# Patient Record
Sex: Female | Born: 1970 | Race: White | Hispanic: No | Smoking: Current every day smoker
Health system: Southern US, Community
[De-identification: ages and names within clinical notes are randomized; demographics above are authoritative.]

## PROBLEM LIST (undated history)

## (undated) DIAGNOSIS — R7303 Prediabetes: Secondary | ICD-10-CM

## (undated) DIAGNOSIS — D509 Iron deficiency anemia, unspecified: Secondary | ICD-10-CM

## (undated) DIAGNOSIS — Z87442 Personal history of urinary calculi: Secondary | ICD-10-CM

## (undated) DIAGNOSIS — T8859XA Other complications of anesthesia, initial encounter: Secondary | ICD-10-CM

## (undated) DIAGNOSIS — Z8719 Personal history of other diseases of the digestive system: Secondary | ICD-10-CM

## (undated) DIAGNOSIS — R350 Frequency of micturition: Secondary | ICD-10-CM

## (undated) DIAGNOSIS — G5790 Unspecified mononeuropathy of unspecified lower limb: Secondary | ICD-10-CM

## (undated) DIAGNOSIS — M1990 Unspecified osteoarthritis, unspecified site: Secondary | ICD-10-CM

## (undated) DIAGNOSIS — K5090 Crohn's disease, unspecified, without complications: Secondary | ICD-10-CM

## (undated) DIAGNOSIS — N390 Urinary tract infection, site not specified: Secondary | ICD-10-CM

## (undated) DIAGNOSIS — N3941 Urge incontinence: Secondary | ICD-10-CM

## (undated) DIAGNOSIS — I10 Essential (primary) hypertension: Secondary | ICD-10-CM

## (undated) DIAGNOSIS — F419 Anxiety disorder, unspecified: Secondary | ICD-10-CM

## (undated) DIAGNOSIS — I82401 Acute embolism and thrombosis of unspecified deep veins of right lower extremity: Secondary | ICD-10-CM

## (undated) DIAGNOSIS — N183 Chronic kidney disease, stage 3 unspecified: Secondary | ICD-10-CM

## (undated) DIAGNOSIS — R0600 Dyspnea, unspecified: Secondary | ICD-10-CM

## (undated) DIAGNOSIS — N939 Abnormal uterine and vaginal bleeding, unspecified: Secondary | ICD-10-CM

## (undated) DIAGNOSIS — R159 Full incontinence of feces: Secondary | ICD-10-CM

## (undated) DIAGNOSIS — Z86718 Personal history of other venous thrombosis and embolism: Secondary | ICD-10-CM

## (undated) DIAGNOSIS — D510 Vitamin B12 deficiency anemia due to intrinsic factor deficiency: Principal | ICD-10-CM

## (undated) DIAGNOSIS — Z7901 Long term (current) use of anticoagulants: Secondary | ICD-10-CM

## (undated) DIAGNOSIS — T4145XA Adverse effect of unspecified anesthetic, initial encounter: Secondary | ICD-10-CM

## (undated) DIAGNOSIS — Z973 Presence of spectacles and contact lenses: Secondary | ICD-10-CM

## (undated) DIAGNOSIS — K219 Gastro-esophageal reflux disease without esophagitis: Secondary | ICD-10-CM

## (undated) DIAGNOSIS — Z789 Other specified health status: Secondary | ICD-10-CM

## (undated) DIAGNOSIS — Z972 Presence of dental prosthetic device (complete) (partial): Secondary | ICD-10-CM

## (undated) DIAGNOSIS — Z8711 Personal history of peptic ulcer disease: Secondary | ICD-10-CM

## (undated) DIAGNOSIS — N3946 Mixed incontinence: Secondary | ICD-10-CM

## (undated) HISTORY — DX: Iron deficiency anemia, unspecified: D50.9

## (undated) HISTORY — PX: EXTRACORPOREAL SHOCK WAVE LITHOTRIPSY: SHX1557

## (undated) HISTORY — DX: Essential (primary) hypertension: I10

## (undated) HISTORY — PX: KNEE SURGERY: SHX244

## (undated) HISTORY — DX: Crohn's disease, unspecified, without complications: K50.90

## (undated) HISTORY — PX: CYSTOSCOPY W/ URETERAL STENT PLACEMENT: SHX1429

## (undated) HISTORY — DX: Anxiety disorder, unspecified: F41.9

## (undated) HISTORY — DX: Vitamin B12 deficiency anemia due to intrinsic factor deficiency: D51.0

## (undated) HISTORY — DX: Gastro-esophageal reflux disease without esophagitis: K21.9

## (undated) HISTORY — PX: LAPAROSCOPIC NISSEN FUNDOPLICATION: SHX1932

---

## 1998-05-11 ENCOUNTER — Other Ambulatory Visit: Admission: RE | Admit: 1998-05-11 | Discharge: 1998-05-11 | Payer: Self-pay | Admitting: Obstetrics & Gynecology

## 1999-01-14 ENCOUNTER — Emergency Department (HOSPITAL_COMMUNITY): Admission: EM | Admit: 1999-01-14 | Discharge: 1999-01-14 | Payer: Self-pay | Admitting: Endocrinology

## 1999-08-23 ENCOUNTER — Other Ambulatory Visit: Admission: RE | Admit: 1999-08-23 | Discharge: 1999-08-23 | Payer: Self-pay | Admitting: Obstetrics & Gynecology

## 2001-10-04 ENCOUNTER — Emergency Department (HOSPITAL_COMMUNITY): Admission: EM | Admit: 2001-10-04 | Discharge: 2001-10-04 | Payer: Self-pay | Admitting: Emergency Medicine

## 2001-10-04 ENCOUNTER — Encounter: Payer: Self-pay | Admitting: Emergency Medicine

## 2007-09-23 ENCOUNTER — Ambulatory Visit (HOSPITAL_COMMUNITY): Admission: RE | Admit: 2007-09-23 | Discharge: 2007-09-23 | Payer: Self-pay | Admitting: Chiropractic Medicine

## 2007-09-25 DIAGNOSIS — K5090 Crohn's disease, unspecified, without complications: Secondary | ICD-10-CM

## 2007-09-25 HISTORY — DX: Crohn's disease, unspecified, without complications: K50.90

## 2007-10-21 ENCOUNTER — Encounter: Admission: RE | Admit: 2007-10-21 | Discharge: 2007-10-21 | Payer: Self-pay | Admitting: Gastroenterology

## 2007-11-25 ENCOUNTER — Encounter (HOSPITAL_COMMUNITY): Admission: RE | Admit: 2007-11-25 | Discharge: 2008-02-07 | Payer: Self-pay | Admitting: Gastroenterology

## 2008-05-09 ENCOUNTER — Emergency Department (HOSPITAL_BASED_OUTPATIENT_CLINIC_OR_DEPARTMENT_OTHER): Admission: EM | Admit: 2008-05-09 | Discharge: 2008-05-09 | Payer: Self-pay | Admitting: Emergency Medicine

## 2008-06-09 ENCOUNTER — Ambulatory Visit: Payer: Self-pay | Admitting: Diagnostic Radiology

## 2008-06-09 ENCOUNTER — Ambulatory Visit (HOSPITAL_BASED_OUTPATIENT_CLINIC_OR_DEPARTMENT_OTHER): Admission: RE | Admit: 2008-06-09 | Discharge: 2008-06-09 | Payer: Self-pay | Admitting: Emergency Medicine

## 2008-06-09 ENCOUNTER — Emergency Department (HOSPITAL_BASED_OUTPATIENT_CLINIC_OR_DEPARTMENT_OTHER): Admission: EM | Admit: 2008-06-09 | Discharge: 2008-06-09 | Payer: Self-pay | Admitting: Emergency Medicine

## 2008-06-11 ENCOUNTER — Inpatient Hospital Stay (HOSPITAL_COMMUNITY): Admission: AD | Admit: 2008-06-11 | Discharge: 2008-06-13 | Payer: Self-pay | Admitting: Gastroenterology

## 2008-06-11 ENCOUNTER — Encounter: Payer: Self-pay | Admitting: Emergency Medicine

## 2008-06-12 ENCOUNTER — Encounter (INDEPENDENT_AMBULATORY_CARE_PROVIDER_SITE_OTHER): Payer: Self-pay | Admitting: Gastroenterology

## 2008-06-30 ENCOUNTER — Emergency Department (HOSPITAL_BASED_OUTPATIENT_CLINIC_OR_DEPARTMENT_OTHER): Admission: EM | Admit: 2008-06-30 | Discharge: 2008-06-30 | Payer: Self-pay | Admitting: Emergency Medicine

## 2008-07-04 ENCOUNTER — Ambulatory Visit: Payer: Self-pay | Admitting: Vascular Surgery

## 2008-07-04 ENCOUNTER — Emergency Department (HOSPITAL_COMMUNITY): Admission: EM | Admit: 2008-07-04 | Discharge: 2008-07-04 | Payer: Self-pay | Admitting: Emergency Medicine

## 2008-07-04 ENCOUNTER — Other Ambulatory Visit: Payer: Self-pay | Admitting: Emergency Medicine

## 2008-07-13 ENCOUNTER — Emergency Department (HOSPITAL_BASED_OUTPATIENT_CLINIC_OR_DEPARTMENT_OTHER): Admission: EM | Admit: 2008-07-13 | Discharge: 2008-07-13 | Payer: Self-pay | Admitting: Emergency Medicine

## 2008-07-17 ENCOUNTER — Emergency Department (HOSPITAL_BASED_OUTPATIENT_CLINIC_OR_DEPARTMENT_OTHER): Admission: EM | Admit: 2008-07-17 | Discharge: 2008-07-17 | Payer: Self-pay | Admitting: Emergency Medicine

## 2008-08-17 ENCOUNTER — Ambulatory Visit: Payer: Self-pay | Admitting: Diagnostic Radiology

## 2008-08-17 ENCOUNTER — Emergency Department (HOSPITAL_BASED_OUTPATIENT_CLINIC_OR_DEPARTMENT_OTHER): Admission: EM | Admit: 2008-08-17 | Discharge: 2008-08-17 | Payer: Self-pay | Admitting: Emergency Medicine

## 2008-09-16 ENCOUNTER — Emergency Department (HOSPITAL_BASED_OUTPATIENT_CLINIC_OR_DEPARTMENT_OTHER): Admission: EM | Admit: 2008-09-16 | Discharge: 2008-09-16 | Payer: Self-pay | Admitting: Emergency Medicine

## 2008-09-23 ENCOUNTER — Encounter (HOSPITAL_COMMUNITY): Admission: RE | Admit: 2008-09-23 | Discharge: 2008-10-29 | Payer: Self-pay | Admitting: Gastroenterology

## 2008-11-08 ENCOUNTER — Ambulatory Visit: Payer: Self-pay | Admitting: Diagnostic Radiology

## 2008-11-08 ENCOUNTER — Emergency Department (HOSPITAL_BASED_OUTPATIENT_CLINIC_OR_DEPARTMENT_OTHER): Admission: EM | Admit: 2008-11-08 | Discharge: 2008-11-08 | Payer: Self-pay | Admitting: Emergency Medicine

## 2009-01-28 ENCOUNTER — Emergency Department (HOSPITAL_BASED_OUTPATIENT_CLINIC_OR_DEPARTMENT_OTHER): Admission: EM | Admit: 2009-01-28 | Discharge: 2009-01-28 | Payer: Self-pay | Admitting: Emergency Medicine

## 2009-01-28 ENCOUNTER — Ambulatory Visit: Payer: Self-pay | Admitting: Diagnostic Radiology

## 2009-02-28 ENCOUNTER — Emergency Department (HOSPITAL_BASED_OUTPATIENT_CLINIC_OR_DEPARTMENT_OTHER): Admission: EM | Admit: 2009-02-28 | Discharge: 2009-02-28 | Payer: Self-pay | Admitting: Emergency Medicine

## 2009-02-28 ENCOUNTER — Ambulatory Visit: Payer: Self-pay | Admitting: Diagnostic Radiology

## 2009-05-01 ENCOUNTER — Emergency Department (HOSPITAL_BASED_OUTPATIENT_CLINIC_OR_DEPARTMENT_OTHER): Admission: EM | Admit: 2009-05-01 | Discharge: 2009-05-01 | Payer: Self-pay | Admitting: Emergency Medicine

## 2009-05-16 ENCOUNTER — Emergency Department (HOSPITAL_BASED_OUTPATIENT_CLINIC_OR_DEPARTMENT_OTHER): Admission: EM | Admit: 2009-05-16 | Discharge: 2009-05-16 | Payer: Self-pay | Admitting: Emergency Medicine

## 2009-08-05 ENCOUNTER — Ambulatory Visit: Payer: Self-pay | Admitting: Surgery

## 2009-08-06 ENCOUNTER — Ambulatory Visit: Payer: Self-pay | Admitting: Hematology & Oncology

## 2009-08-06 ENCOUNTER — Inpatient Hospital Stay (HOSPITAL_COMMUNITY): Admission: EM | Admit: 2009-08-06 | Discharge: 2009-08-11 | Payer: Self-pay | Admitting: Emergency Medicine

## 2009-08-06 ENCOUNTER — Ambulatory Visit: Payer: Self-pay | Admitting: Diagnostic Radiology

## 2009-08-06 ENCOUNTER — Encounter: Payer: Self-pay | Admitting: Emergency Medicine

## 2009-08-06 ENCOUNTER — Encounter: Payer: Self-pay | Admitting: Surgery

## 2009-08-06 ENCOUNTER — Ambulatory Visit: Payer: Self-pay | Admitting: Internal Medicine

## 2009-08-06 HISTORY — PX: TRANSTHORACIC ECHOCARDIOGRAM: SHX275

## 2009-08-12 ENCOUNTER — Ambulatory Visit: Payer: Self-pay | Admitting: Hematology & Oncology

## 2009-08-24 LAB — CBC WITH DIFFERENTIAL (CANCER CENTER ONLY)
BASO%: 1 % (ref 0.0–2.0)
HCT: 38.4 % (ref 34.8–46.6)
LYMPH#: 3 10*3/uL (ref 0.9–3.3)
MONO#: 0.6 10*3/uL (ref 0.1–0.9)
Platelets: 946 10*3/uL — ABNORMAL HIGH (ref 145–400)
RBC: 5.01 10*6/uL (ref 3.70–5.32)
RDW: 21.9 % — ABNORMAL HIGH (ref 10.5–14.6)
WBC: 14.2 10*3/uL — ABNORMAL HIGH (ref 3.9–10.0)

## 2009-08-24 LAB — TECHNOLOGIST REVIEW CHCC SATELLITE

## 2009-08-25 LAB — LUPUS ANTICOAGULANT PANEL

## 2009-08-25 LAB — HEPARIN ANTI-XA: Heparin LMW: 2.01 IU/mL

## 2009-08-30 ENCOUNTER — Ambulatory Visit: Payer: Self-pay | Admitting: Surgery

## 2009-08-30 ENCOUNTER — Encounter: Admission: RE | Admit: 2009-08-30 | Discharge: 2009-08-30 | Payer: Self-pay | Admitting: Surgery

## 2009-08-31 LAB — JAK2 GENOTYPR: JAK2 GenotypR: NOT DETECTED

## 2009-09-09 LAB — CBC WITH DIFFERENTIAL (CANCER CENTER ONLY)
BASO#: 0.2 10*3/uL (ref 0.0–0.2)
BASO%: 1.1 % (ref 0.0–2.0)
EOS%: 4.3 % (ref 0.0–7.0)
HCT: 36.4 % (ref 34.8–46.6)
HGB: 11.7 g/dL (ref 11.6–15.9)
MCH: 25.8 pg — ABNORMAL LOW (ref 26.0–34.0)
MCHC: 32.1 g/dL (ref 32.0–36.0)
MONO%: 6 % (ref 0.0–13.0)
NEUT%: 64.7 % (ref 39.6–80.0)
RDW: 24.4 % — ABNORMAL HIGH (ref 10.5–14.6)

## 2009-09-13 ENCOUNTER — Ambulatory Visit: Payer: Self-pay | Admitting: Hematology & Oncology

## 2009-09-20 LAB — PROTHROMBIN TIME
INR: 3.74 — ABNORMAL HIGH (ref <1.50)
Prothrombin Time: 36.7 seconds — ABNORMAL HIGH (ref 11.6–15.2)

## 2009-09-20 LAB — PROTIME-INR (CHCC SATELLITE)

## 2009-09-21 LAB — PROTIME-INR (CHCC SATELLITE): Protime: 37.2 Seconds — ABNORMAL HIGH (ref 10.6–13.4)

## 2009-09-21 LAB — PROTHROMBIN TIME: Prothrombin Time: 30 seconds — ABNORMAL HIGH (ref 11.6–15.2)

## 2009-09-22 LAB — PROTIME-INR (CHCC SATELLITE): INR: 1.5 — ABNORMAL LOW (ref 2.0–3.5)

## 2009-09-30 LAB — PROTIME-INR (CHCC SATELLITE): INR: 1.8 — ABNORMAL LOW (ref 2.0–3.5)

## 2009-10-07 LAB — PROTIME-INR (CHCC SATELLITE)

## 2009-10-14 ENCOUNTER — Ambulatory Visit: Payer: Self-pay | Admitting: Hematology & Oncology

## 2009-10-18 LAB — PROTIME-INR (CHCC SATELLITE): Protime: 26.4 Seconds — ABNORMAL HIGH (ref 10.6–13.4)

## 2009-11-01 LAB — PROTIME-INR (CHCC SATELLITE): Protime: 52.8 Seconds — ABNORMAL HIGH (ref 10.6–13.4)

## 2009-11-15 ENCOUNTER — Ambulatory Visit: Payer: Self-pay | Admitting: Hematology & Oncology

## 2009-11-15 LAB — CBC WITH DIFFERENTIAL (CANCER CENTER ONLY)
Eosinophils Absolute: 0.5 10*3/uL (ref 0.0–0.5)
HGB: 12.5 g/dL (ref 11.6–15.9)
LYMPH#: 2.5 10*3/uL (ref 0.9–3.3)
MCH: 31.1 pg (ref 26.0–34.0)
MONO%: 5.2 % (ref 0.0–13.0)
NEUT#: 9.8 10*3/uL — ABNORMAL HIGH (ref 1.5–6.5)
Platelets: 628 10*3/uL — ABNORMAL HIGH (ref 145–400)
RBC: 4 10*6/uL (ref 3.70–5.32)
WBC: 13.6 10*3/uL — ABNORMAL HIGH (ref 3.9–10.0)

## 2009-11-15 LAB — PROTIME-INR (CHCC SATELLITE)
INR: 1.3 — ABNORMAL LOW (ref 2.0–3.5)
Protime: 15.6 Seconds — ABNORMAL HIGH (ref 10.6–13.4)

## 2009-11-19 LAB — PROTIME-INR (CHCC SATELLITE)
INR: 2.4 (ref 2.0–3.5)
Protime: 28.8 Seconds — ABNORMAL HIGH (ref 10.6–13.4)

## 2009-11-29 LAB — PROTIME-INR (CHCC SATELLITE): Protime: 36 Seconds — ABNORMAL HIGH (ref 10.6–13.4)

## 2009-12-02 ENCOUNTER — Ambulatory Visit (HOSPITAL_BASED_OUTPATIENT_CLINIC_OR_DEPARTMENT_OTHER): Admission: RE | Admit: 2009-12-02 | Discharge: 2009-12-02 | Payer: Self-pay | Admitting: Hematology & Oncology

## 2009-12-02 ENCOUNTER — Ambulatory Visit: Payer: Self-pay | Admitting: Diagnostic Radiology

## 2009-12-16 ENCOUNTER — Ambulatory Visit: Payer: Self-pay | Admitting: Hematology & Oncology

## 2009-12-17 LAB — CBC WITH DIFFERENTIAL (CANCER CENTER ONLY)
BASO%: 0.8 % (ref 0.0–2.0)
EOS%: 4.1 % (ref 0.0–7.0)
HCT: 33.4 % — ABNORMAL LOW (ref 34.8–46.6)
LYMPH%: 21.4 % (ref 14.0–48.0)
MCHC: 34.1 g/dL (ref 32.0–36.0)
MCV: 92 fL (ref 81–101)
MONO#: 0.9 10*3/uL (ref 0.1–0.9)
MONO%: 6.4 % (ref 0.0–13.0)
NEUT%: 67.3 % (ref 39.6–80.0)
Platelets: 645 10*3/uL — ABNORMAL HIGH (ref 145–400)
RDW: 11.7 % (ref 10.5–14.6)
WBC: 14 10*3/uL — ABNORMAL HIGH (ref 3.9–10.0)

## 2009-12-17 LAB — COMPREHENSIVE METABOLIC PANEL
Alkaline Phosphatase: 112 U/L (ref 39–117)
BUN: 12 mg/dL (ref 6–23)
CO2: 19 mEq/L (ref 19–32)
Creatinine, Ser: 0.97 mg/dL (ref 0.40–1.20)
Glucose, Bld: 86 mg/dL (ref 70–99)
Total Bilirubin: 0.2 mg/dL — ABNORMAL LOW (ref 0.3–1.2)
Total Protein: 7 g/dL (ref 6.0–8.3)

## 2009-12-17 LAB — PROTIME-INR (CHCC SATELLITE): Protime: 54 Seconds — ABNORMAL HIGH (ref 10.6–13.4)

## 2009-12-17 LAB — LACTATE DEHYDROGENASE: LDH: 152 U/L (ref 94–250)

## 2010-01-12 ENCOUNTER — Ambulatory Visit: Payer: Self-pay | Admitting: Diagnostic Radiology

## 2010-01-12 ENCOUNTER — Ambulatory Visit (HOSPITAL_BASED_OUTPATIENT_CLINIC_OR_DEPARTMENT_OTHER): Admission: RE | Admit: 2010-01-12 | Discharge: 2010-01-12 | Payer: Self-pay | Admitting: Family Medicine

## 2010-01-21 ENCOUNTER — Ambulatory Visit: Payer: Self-pay | Admitting: Hematology & Oncology

## 2010-01-21 LAB — PROTIME-INR (CHCC SATELLITE): INR: 4.7 — ABNORMAL HIGH (ref 2.0–3.5)

## 2010-01-28 LAB — PROTIME-INR (CHCC SATELLITE): INR: 2 (ref 2.0–3.5)

## 2010-03-03 ENCOUNTER — Ambulatory Visit: Payer: Self-pay | Admitting: Hematology & Oncology

## 2010-03-17 LAB — CBC WITH DIFFERENTIAL (CANCER CENTER ONLY)
BASO#: 0.1 10*3/uL (ref 0.0–0.2)
BASO%: 0.8 % (ref 0.0–2.0)
EOS%: 3.5 % (ref 0.0–7.0)
Eosinophils Absolute: 0.6 10*3/uL — ABNORMAL HIGH (ref 0.0–0.5)
HCT: 28.3 % — ABNORMAL LOW (ref 34.8–46.6)
HGB: 9 g/dL — ABNORMAL LOW (ref 11.6–15.9)
LYMPH%: 21.2 % (ref 14.0–48.0)
MCV: 77 fL — ABNORMAL LOW (ref 81–101)
MONO#: 1 10*3/uL — ABNORMAL HIGH (ref 0.1–0.9)
NEUT%: 68 % (ref 39.6–80.0)
WBC: 15.7 10*3/uL — ABNORMAL HIGH (ref 3.9–10.0)

## 2010-03-17 LAB — COMPREHENSIVE METABOLIC PANEL
ALT: 14 U/L (ref 0–35)
AST: 18 U/L (ref 0–37)
CO2: 23 mEq/L (ref 19–32)
Calcium: 9 mg/dL (ref 8.4–10.5)
Chloride: 104 mEq/L (ref 96–112)
Sodium: 138 mEq/L (ref 135–145)
Total Protein: 6.7 g/dL (ref 6.0–8.3)

## 2010-03-17 LAB — TECHNOLOGIST REVIEW CHCC SATELLITE

## 2010-03-17 LAB — LACTATE DEHYDROGENASE: LDH: 148 U/L (ref 94–250)

## 2010-03-17 LAB — PROTIME-INR (CHCC SATELLITE)

## 2010-04-05 ENCOUNTER — Ambulatory Visit: Payer: Self-pay | Admitting: Hematology & Oncology

## 2010-05-11 ENCOUNTER — Ambulatory Visit: Payer: Self-pay | Admitting: Hematology & Oncology

## 2010-05-12 LAB — PROTIME-INR (CHCC SATELLITE)
INR: 1.8 — ABNORMAL LOW (ref 2.0–3.5)
Protime: 21.6 Seconds — ABNORMAL HIGH (ref 10.6–13.4)

## 2010-05-12 LAB — CBC WITH DIFFERENTIAL (CANCER CENTER ONLY)
BASO#: 0.2 10*3/uL (ref 0.0–0.2)
BASO%: 1 % (ref 0.0–2.0)
EOS%: 2.7 % (ref 0.0–7.0)
HCT: 41 % (ref 34.8–46.6)
HGB: 13.5 g/dL (ref 11.6–15.9)
LYMPH#: 2.9 10*3/uL (ref 0.9–3.3)
MCHC: 32.9 g/dL (ref 32.0–36.0)
MONO#: 0.8 10*3/uL (ref 0.1–0.9)
NEUT#: 12.8 10*3/uL — ABNORMAL HIGH (ref 1.5–6.5)
NEUT%: 74.7 % (ref 39.6–80.0)
WBC: 17.1 10*3/uL — ABNORMAL HIGH (ref 3.9–10.0)

## 2010-05-13 LAB — LUPUS ANTICOAGULANT PANEL
DRVVT 1:1 Mix: 43.9 secs (ref 36.2–44.3)
DRVVT: 62.7 secs — ABNORMAL HIGH (ref 36.2–44.3)
PTT Lupus Anticoagulant: 51.7 secs — ABNORMAL HIGH (ref 30.0–45.6)
PTTLA 4:1 Mix: 45.6 secs (ref 30.0–45.6)

## 2010-05-13 LAB — RETICULOCYTES (CHCC): Retic Ct Pct: 1.4 % (ref 0.4–3.1)

## 2010-05-27 ENCOUNTER — Ambulatory Visit (HOSPITAL_BASED_OUTPATIENT_CLINIC_OR_DEPARTMENT_OTHER)
Admission: RE | Admit: 2010-05-27 | Discharge: 2010-05-27 | Payer: Self-pay | Source: Home / Self Care | Admitting: Hematology & Oncology

## 2010-07-12 ENCOUNTER — Ambulatory Visit: Payer: Self-pay | Admitting: Hematology & Oncology

## 2010-07-13 LAB — CBC WITH DIFFERENTIAL (CANCER CENTER ONLY)
BASO#: 0.1 10*3/uL (ref 0.0–0.2)
BASO%: 0.8 % (ref 0.0–2.0)
EOS%: 3.5 % (ref 0.0–7.0)
Eosinophils Absolute: 0.4 10*3/uL (ref 0.0–0.5)
HCT: 37.8 % (ref 34.8–46.6)
HGB: 12.8 g/dL (ref 11.6–15.9)
LYMPH#: 2.3 10*3/uL (ref 0.9–3.3)
LYMPH%: 18.7 % (ref 14.0–48.0)
MCH: 32.8 pg (ref 26.0–34.0)
MCHC: 34 g/dL (ref 32.0–36.0)
MCV: 97 fL (ref 81–101)
MONO#: 0.7 10*3/uL (ref 0.1–0.9)
MONO%: 6 % (ref 0.0–13.0)
NEUT#: 8.6 10*3/uL — ABNORMAL HIGH (ref 1.5–6.5)
NEUT%: 71 % (ref 39.6–80.0)
Platelets: 561 10*3/uL — ABNORMAL HIGH (ref 145–400)
RBC: 3.9 10*6/uL (ref 3.70–5.32)
RDW: 13.1 % (ref 10.5–14.6)
WBC: 12.1 10*3/uL — ABNORMAL HIGH (ref 3.9–10.0)

## 2010-07-13 LAB — PROTIME-INR (CHCC SATELLITE)
INR: 4.2 — ABNORMAL HIGH (ref 2.0–3.5)
Protime: 50.4 Seconds — ABNORMAL HIGH (ref 10.6–13.4)

## 2010-07-14 LAB — RETICULOCYTES (CHCC)
ABS Retic: 90.6 10*3/uL (ref 19.0–186.0)
RBC.: 3.94 MIL/uL (ref 3.87–5.11)
Retic Ct Pct: 2.3 % (ref 0.4–3.1)

## 2010-07-14 LAB — ERYTHROPOIETIN: Erythropoietin: 28.2 m[IU]/mL (ref 2.6–34.0)

## 2010-07-17 ENCOUNTER — Encounter: Payer: Self-pay | Admitting: Surgery

## 2010-07-18 ENCOUNTER — Encounter: Payer: Self-pay | Admitting: Gastroenterology

## 2010-07-27 ENCOUNTER — Other Ambulatory Visit: Payer: Self-pay | Admitting: Obstetrics and Gynecology

## 2010-08-03 ENCOUNTER — Other Ambulatory Visit: Payer: Self-pay | Admitting: Hematology & Oncology

## 2010-08-03 ENCOUNTER — Encounter (HOSPITAL_BASED_OUTPATIENT_CLINIC_OR_DEPARTMENT_OTHER): Payer: PRIVATE HEALTH INSURANCE | Admitting: Hematology & Oncology

## 2010-08-03 DIAGNOSIS — D509 Iron deficiency anemia, unspecified: Secondary | ICD-10-CM

## 2010-08-03 DIAGNOSIS — I7411 Embolism and thrombosis of thoracic aorta: Secondary | ICD-10-CM

## 2010-08-03 DIAGNOSIS — D473 Essential (hemorrhagic) thrombocythemia: Secondary | ICD-10-CM

## 2010-08-03 DIAGNOSIS — Z7901 Long term (current) use of anticoagulants: Secondary | ICD-10-CM

## 2010-08-03 LAB — PROTIME-INR (CHCC SATELLITE)
INR: 3.5 (ref 2.0–3.5)
Protime: 42 Seconds — ABNORMAL HIGH (ref 10.6–13.4)

## 2010-08-17 ENCOUNTER — Other Ambulatory Visit: Payer: Self-pay | Admitting: Hematology & Oncology

## 2010-08-17 ENCOUNTER — Encounter (HOSPITAL_BASED_OUTPATIENT_CLINIC_OR_DEPARTMENT_OTHER): Payer: PRIVATE HEALTH INSURANCE | Admitting: Hematology & Oncology

## 2010-08-17 DIAGNOSIS — D509 Iron deficiency anemia, unspecified: Secondary | ICD-10-CM

## 2010-08-17 DIAGNOSIS — I7411 Embolism and thrombosis of thoracic aorta: Secondary | ICD-10-CM

## 2010-08-17 DIAGNOSIS — Z7901 Long term (current) use of anticoagulants: Secondary | ICD-10-CM

## 2010-08-17 DIAGNOSIS — D473 Essential (hemorrhagic) thrombocythemia: Secondary | ICD-10-CM

## 2010-08-17 LAB — CBC WITH DIFFERENTIAL (CANCER CENTER ONLY)
BASO#: 0.1 10*3/uL (ref 0.0–0.2)
Eosinophils Absolute: 0.4 10*3/uL (ref 0.0–0.5)
HGB: 9.8 g/dL — ABNORMAL LOW (ref 11.6–15.9)
LYMPH%: 20.1 % (ref 14.0–48.0)
MCH: 33 pg (ref 26.0–34.0)
MCHC: 34.8 g/dL (ref 32.0–36.0)
MCV: 95 fL (ref 81–101)
MONO%: 6.4 % (ref 0.0–13.0)
NEUT%: 69.8 % (ref 39.6–80.0)
RBC: 2.95 10*6/uL — ABNORMAL LOW (ref 3.70–5.32)

## 2010-08-17 LAB — PROTIME-INR (CHCC SATELLITE)
INR: 2.7 (ref 2.0–3.5)
Protime: 32.4 Seconds — ABNORMAL HIGH (ref 10.6–13.4)

## 2010-08-17 LAB — IRON AND TIBC: %SAT: 7 % — ABNORMAL LOW (ref 20–55)

## 2010-09-09 DIAGNOSIS — N393 Stress incontinence (female) (male): Secondary | ICD-10-CM | POA: Insufficient documentation

## 2010-09-09 DIAGNOSIS — N924 Excessive bleeding in the premenopausal period: Secondary | ICD-10-CM | POA: Insufficient documentation

## 2010-09-14 LAB — CBC
HCT: 24.1 % — ABNORMAL LOW (ref 36.0–46.0)
HCT: 28.7 % — ABNORMAL LOW (ref 36.0–46.0)
HCT: 30.3 % — ABNORMAL LOW (ref 36.0–46.0)
HCT: 30.9 % — ABNORMAL LOW (ref 36.0–46.0)
HCT: 34.4 % — ABNORMAL LOW (ref 36.0–46.0)
Hemoglobin: 11.1 g/dL — ABNORMAL LOW (ref 12.0–15.0)
Hemoglobin: 7.3 g/dL — ABNORMAL LOW (ref 12.0–15.0)
Hemoglobin: 9.5 g/dL — ABNORMAL LOW (ref 12.0–15.0)
Hemoglobin: 9.6 g/dL — ABNORMAL LOW (ref 12.0–15.0)
Hemoglobin: 9.8 g/dL — ABNORMAL LOW (ref 12.0–15.0)
Hemoglobin: 9.9 g/dL — ABNORMAL LOW (ref 12.0–15.0)
MCHC: 30.4 g/dL (ref 30.0–36.0)
MCHC: 31.3 g/dL (ref 30.0–36.0)
MCHC: 31.9 g/dL (ref 30.0–36.0)
MCHC: 32.1 g/dL (ref 30.0–36.0)
MCHC: 32.3 g/dL (ref 30.0–36.0)
MCV: 65.9 fL — ABNORMAL LOW (ref 78.0–100.0)
MCV: 66.2 fL — ABNORMAL LOW (ref 78.0–100.0)
MCV: 71.3 fL — ABNORMAL LOW (ref 78.0–100.0)
MCV: 72 fL — ABNORMAL LOW (ref 78.0–100.0)
MCV: 72.2 fL — ABNORMAL LOW (ref 78.0–100.0)
MCV: 73.4 fL — ABNORMAL LOW (ref 78.0–100.0)
Platelets: 1076 10*3/uL (ref 150–400)
Platelets: 587 K/uL — ABNORMAL HIGH (ref 150–400)
Platelets: 606 K/uL — ABNORMAL HIGH (ref 150–400)
Platelets: 726 K/uL — ABNORMAL HIGH (ref 150–400)
RBC: 3.65 MIL/uL — ABNORMAL LOW (ref 3.87–5.11)
RBC: 4.03 MIL/uL (ref 3.87–5.11)
RBC: 4.2 MIL/uL (ref 3.87–5.11)
RBC: 4.29 MIL/uL (ref 3.87–5.11)
RBC: 4.29 MIL/uL (ref 3.87–5.11)
RBC: 4.36 MIL/uL (ref 3.87–5.11)
RBC: 4.83 MIL/uL (ref 3.87–5.11)
RDW: 27.8 % — ABNORMAL HIGH (ref 11.5–15.5)
RDW: 28.2 % — ABNORMAL HIGH (ref 11.5–15.5)
RDW: 28.5 % — ABNORMAL HIGH (ref 11.5–15.5)
WBC: 17 10*3/uL — ABNORMAL HIGH (ref 4.0–10.5)
WBC: 17 K/uL — ABNORMAL HIGH (ref 4.0–10.5)
WBC: 18.3 10*3/uL — ABNORMAL HIGH (ref 4.0–10.5)
WBC: 24.7 10*3/uL — ABNORMAL HIGH (ref 4.0–10.5)
WBC: 26 K/uL — ABNORMAL HIGH (ref 4.0–10.5)
WBC: 29.6 K/uL — ABNORMAL HIGH (ref 4.0–10.5)

## 2010-09-14 LAB — FOLATE: Folate: 9.2 ng/mL

## 2010-09-14 LAB — COMPREHENSIVE METABOLIC PANEL WITH GFR
ALT: 42 U/L — ABNORMAL HIGH (ref 0–35)
AST: 28 U/L (ref 0–37)
Albumin: 2.5 g/dL — ABNORMAL LOW (ref 3.5–5.2)
Alkaline Phosphatase: 112 U/L (ref 39–117)
BUN: 10 mg/dL (ref 6–23)
CO2: 27 meq/L (ref 19–32)
Calcium: 8.8 mg/dL (ref 8.4–10.5)
Chloride: 102 meq/L (ref 96–112)
Creatinine, Ser: 1.06 mg/dL (ref 0.4–1.2)
GFR calc Af Amer: >60 mL/min (ref >60)
GFR calc non Af Amer: 57 mL/min — ABNORMAL LOW (ref >60)
Glucose, Bld: 88 mg/dL (ref 70–99)
Potassium: 3.1 meq/L — ABNORMAL LOW (ref 3.5–5.1)
Sodium: 133 meq/L — ABNORMAL LOW (ref 135–145)
Total Bilirubin: 0.8 mg/dL (ref 0.3–1.2)
Total Protein: 6.2 g/dL (ref 6.0–8.3)

## 2010-09-14 LAB — BETA-2-GLYCOPROTEIN I ABS, IGG/M/A
Beta-2-Glycoprotein I IgA: 5 U/mL (ref <=15)
Beta-2-Glycoprotein I IgM: 3 U/mL (ref <=15)

## 2010-09-14 LAB — CROSSMATCH: ABO/RH(D): O POS

## 2010-09-14 LAB — BASIC METABOLIC PANEL
CO2: 28 mEq/L (ref 19–32)
Calcium: 9.1 mg/dL (ref 8.4–10.5)
Chloride: 101 mEq/L (ref 96–112)
GFR calc Af Amer: >60 mL/min (ref >60)
GFR calc Af Amer: >60 mL/min (ref >60)
GFR calc non Af Amer: 46 mL/min — ABNORMAL LOW (ref >60)
GFR calc non Af Amer: 51 mL/min — ABNORMAL LOW (ref >60)
GFR calc non Af Amer: 58 mL/min — ABNORMAL LOW (ref >60)
Potassium: 2.9 mEq/L — ABNORMAL LOW (ref 3.5–5.1)
Potassium: 3.7 mEq/L (ref 3.5–5.1)
Sodium: 133 mEq/L — ABNORMAL LOW (ref 135–145)
Sodium: 134 mEq/L — ABNORMAL LOW (ref 135–145)
Sodium: 136 mEq/L (ref 135–145)

## 2010-09-14 LAB — URINE MICROSCOPIC-ADD ON

## 2010-09-14 LAB — DIFFERENTIAL
Band Neutrophils: 0 % (ref 0–10)
Basophils Absolute: 0 K/uL (ref 0.0–0.1)
Basophils Absolute: 0.2 10*3/uL — ABNORMAL HIGH (ref 0.0–0.1)
Basophils Relative: 0 % (ref 0–1)
Basophils Relative: 1 % (ref 0–1)
Blasts: 0 %
Eosinophils Absolute: 0 10*3/uL (ref 0.0–0.7)
Eosinophils Absolute: 0 K/uL (ref 0.0–0.7)
Eosinophils Relative: 0 % (ref 0–5)
Lymphocytes Relative: 6 % — ABNORMAL LOW (ref 12–46)
Lymphocytes Relative: 8 % — ABNORMAL LOW (ref 12–46)
Lymphs Abs: 1.8 K/uL (ref 0.7–4.0)
Metamyelocytes Relative: 0 %
Monocytes Absolute: 1.1 K/uL — ABNORMAL HIGH (ref 0.1–1.0)
Monocytes Relative: 5 % (ref 3–12)
Myelocytes: 0 %
Neutro Abs: 19.3 K/uL — ABNORMAL HIGH (ref 1.7–7.7)
Neutrophils Relative %: 87 % — ABNORMAL HIGH (ref 43–77)
Neutrophils Relative %: 90 % — ABNORMAL HIGH (ref 43–77)
Promyelocytes Absolute: 0 %
Smear Review: INCREASED
nRBC: 0 /100{WBCs}

## 2010-09-14 LAB — PROTIME-INR
INR: 1.19 (ref 0.00–1.49)
Prothrombin Time: 14.1 seconds (ref 11.6–15.2)
Prothrombin Time: 14.2 seconds (ref 11.6–15.2)
Prothrombin Time: 15 s (ref 11.6–15.2)

## 2010-09-14 LAB — IRON AND TIBC
Iron: 13 ug/dL — ABNORMAL LOW (ref 42–135)
Saturation Ratios: 3 % — ABNORMAL LOW (ref 20–55)
TIBC: 431 ug/dL (ref 250–470)
UIBC: 418 ug/dL

## 2010-09-14 LAB — URINALYSIS, ROUTINE W REFLEX MICROSCOPIC
Glucose, UA: NEGATIVE mg/dL
Leukocytes, UA: NEGATIVE
Nitrite: NEGATIVE
Protein, ur: 30 mg/dL — AB
Urobilinogen, UA: 0.2 mg/dL (ref 0.0–1.0)

## 2010-09-14 LAB — RETICULOCYTES
RBC.: 3.72 MIL/uL — ABNORMAL LOW (ref 3.87–5.11)
Retic Count, Absolute: 81.8 K/uL (ref 19.0–186.0)
Retic Ct Pct: 2.2 % (ref 0.4–3.1)

## 2010-09-14 LAB — LUPUS ANTICOAGULANT PANEL
DRVVT: 54.9 s — ABNORMAL HIGH (ref 36.2–46.0)
Drvvt confirmation: 1.07 ratio (ref <1.21)
Lupus Anticoagulant: DETECTED — AB
PTT Lupus Anticoagulant: 75 s — ABNORMAL HIGH (ref 32.0–43.4)
PTTLA 4:1 Mix: 68.6 s — ABNORMAL HIGH (ref 36.3–48.8)
PTTLA Confirmation: 15.2 s — ABNORMAL HIGH (ref <8.0)
dRVVT Incubated 1:1 Mix: 43.8 s (ref 36.1–47.0)

## 2010-09-14 LAB — HOMOCYSTEINE: Homocysteine: 8.3 umol/L (ref 4.0–15.4)

## 2010-09-14 LAB — PROTHROMBIN GENE MUTATION

## 2010-09-14 LAB — HEPARIN LEVEL (UNFRACTIONATED)
Heparin Unfractionated: 0.15 IU/mL — ABNORMAL LOW (ref 0.30–0.70)
Heparin Unfractionated: 0.17 [IU]/mL — ABNORMAL LOW (ref 0.30–0.70)
Heparin Unfractionated: 0.27 [IU]/mL — ABNORMAL LOW (ref 0.30–0.70)
Heparin Unfractionated: 0.32 IU/mL (ref 0.30–0.70)
Heparin Unfractionated: 0.34 IU/mL (ref 0.30–0.70)
Heparin Unfractionated: 0.46 [IU]/mL (ref 0.30–0.70)

## 2010-09-14 LAB — PROTEIN S, TOTAL: Protein S Ag, Total: 136 % (ref 70–140)

## 2010-09-14 LAB — CARDIOLIPIN ANTIBODIES, IGG, IGM, IGA
Anticardiolipin IgA: <3 U/mL — ABNORMAL LOW (ref <10)
Anticardiolipin IgG: 3 GPL U/mL — ABNORMAL LOW (ref <10)
Anticardiolipin IgM: 4 [MPL'U]/mL — ABNORMAL LOW (ref <10)

## 2010-09-14 LAB — COMPREHENSIVE METABOLIC PANEL
ALT: 24 U/L (ref 0–35)
BUN: 13 mg/dL (ref 6–23)
CO2: 27 mEq/L (ref 19–32)
Chloride: 100 mEq/L (ref 96–112)
Creatinine, Ser: 1.1 mg/dL (ref 0.4–1.2)
Potassium: 4.6 mEq/L (ref 3.5–5.1)

## 2010-09-14 LAB — APTT
aPTT: 49 seconds — ABNORMAL HIGH (ref 24–37)
aPTT: 78 s — ABNORMAL HIGH (ref 24–37)

## 2010-09-14 LAB — PROTEIN C ACTIVITY: Protein C Activity: 112 % (ref 75–133)

## 2010-09-14 LAB — PROTEIN S ACTIVITY: Protein S Activity: 88 % (ref 69–129)

## 2010-09-14 LAB — SEDIMENTATION RATE: Sed Rate: 77 mm/h — ABNORMAL HIGH (ref 0–22)

## 2010-09-14 LAB — PATHOLOGIST SMEAR REVIEW

## 2010-09-19 ENCOUNTER — Encounter (HOSPITAL_BASED_OUTPATIENT_CLINIC_OR_DEPARTMENT_OTHER): Payer: PRIVATE HEALTH INSURANCE | Admitting: Hematology & Oncology

## 2010-09-19 ENCOUNTER — Ambulatory Visit (HOSPITAL_BASED_OUTPATIENT_CLINIC_OR_DEPARTMENT_OTHER)
Admission: RE | Admit: 2010-09-19 | Discharge: 2010-09-19 | Disposition: A | Discharge status: Home / Self Care | Payer: PRIVATE HEALTH INSURANCE | Source: Ambulatory Visit | Attending: Hematology & Oncology | Admitting: Hematology & Oncology

## 2010-09-19 ENCOUNTER — Other Ambulatory Visit: Payer: Self-pay | Admitting: Hematology & Oncology

## 2010-09-19 DIAGNOSIS — R109 Unspecified abdominal pain: Secondary | ICD-10-CM | POA: Insufficient documentation

## 2010-09-19 DIAGNOSIS — K5090 Crohn's disease, unspecified, without complications: Secondary | ICD-10-CM | POA: Insufficient documentation

## 2010-09-19 DIAGNOSIS — I7411 Embolism and thrombosis of thoracic aorta: Secondary | ICD-10-CM

## 2010-09-19 DIAGNOSIS — D509 Iron deficiency anemia, unspecified: Secondary | ICD-10-CM

## 2010-09-19 DIAGNOSIS — Z7901 Long term (current) use of anticoagulants: Secondary | ICD-10-CM

## 2010-09-19 DIAGNOSIS — N920 Excessive and frequent menstruation with regular cycle: Secondary | ICD-10-CM

## 2010-09-19 LAB — IRON AND TIBC
%SAT: 4 % — ABNORMAL LOW (ref 20–55)
Iron: 16 ug/dL — ABNORMAL LOW (ref 42–145)
TIBC: 416 ug/dL (ref 250–470)

## 2010-09-19 LAB — PROTIME-INR (CHCC SATELLITE)

## 2010-09-19 LAB — CBC WITH DIFFERENTIAL (CANCER CENTER ONLY)
BASO#: 0.1 10*3/uL (ref 0.0–0.2)
Eosinophils Absolute: 0.4 10*3/uL (ref 0.0–0.5)
HCT: 34.5 % — ABNORMAL LOW (ref 34.8–46.6)
HGB: 11 g/dL — ABNORMAL LOW (ref 11.6–15.9)
LYMPH#: 2.6 10*3/uL (ref 0.9–3.3)
LYMPH%: 14.1 % (ref 14.0–48.0)
MCV: 90 fL (ref 81–101)
MONO#: 0.8 10*3/uL (ref 0.1–0.9)
NEUT%: 79 % (ref 39.6–80.0)
RDW: 15.9 % — ABNORMAL HIGH (ref 11.1–15.7)
WBC: 18.5 10*3/uL — ABNORMAL HIGH (ref 3.9–10.0)

## 2010-09-19 LAB — RETICULOCYTES (CHCC)
ABS Retic: 66.8 10*3/uL (ref 19.0–186.0)
Retic Ct Pct: 1.7 % (ref 0.4–3.1)

## 2010-09-19 LAB — FERRITIN: Ferritin: 12 ng/mL (ref 10–291)

## 2010-09-20 ENCOUNTER — Encounter (HOSPITAL_BASED_OUTPATIENT_CLINIC_OR_DEPARTMENT_OTHER): Payer: PRIVATE HEALTH INSURANCE | Admitting: Hematology & Oncology

## 2010-09-20 DIAGNOSIS — D509 Iron deficiency anemia, unspecified: Secondary | ICD-10-CM

## 2010-10-01 LAB — DIFFERENTIAL
Basophils Relative: 1 % (ref 0–1)
Eosinophils Absolute: 0.4 10*3/uL (ref 0.0–0.7)
Eosinophils Relative: 3 % (ref 0–5)
Lymphs Abs: 2.2 10*3/uL (ref 0.7–4.0)
Monocytes Absolute: 0.6 10*3/uL (ref 0.1–1.0)
Neutro Abs: 8.4 10*3/uL — ABNORMAL HIGH (ref 1.7–7.7)
Neutrophils Relative %: 72 % (ref 43–77)

## 2010-10-01 LAB — CBC
HCT: 27.5 % — ABNORMAL LOW (ref 36.0–46.0)
Hemoglobin: 8.5 g/dL — ABNORMAL LOW (ref 12.0–15.0)
MCHC: 31 g/dL (ref 30.0–36.0)
MCV: 68.9 fL — ABNORMAL LOW (ref 78.0–100.0)
RBC: 3.99 MIL/uL (ref 3.87–5.11)
WBC: 11.7 10*3/uL — ABNORMAL HIGH (ref 4.0–10.5)

## 2010-10-06 LAB — DIFFERENTIAL
Basophils Absolute: 0 10*3/uL (ref 0.0–0.1)
Lymphocytes Relative: 22 % (ref 12–46)
Lymphs Abs: 2.6 10*3/uL (ref 0.7–4.0)
Monocytes Relative: 6 % (ref 3–12)

## 2010-10-06 LAB — LIPASE, BLOOD: Lipase: 44 U/L (ref 23–300)

## 2010-10-06 LAB — URINALYSIS, ROUTINE W REFLEX MICROSCOPIC
Glucose, UA: NEGATIVE mg/dL
Protein, ur: NEGATIVE mg/dL
Specific Gravity, Urine: 1.003 — ABNORMAL LOW (ref 1.005–1.030)
Urobilinogen, UA: 0.2 mg/dL (ref 0.0–1.0)

## 2010-10-06 LAB — CBC
Hemoglobin: 7.3 g/dL — CL (ref 12.0–15.0)
MCHC: 29.5 g/dL — ABNORMAL LOW (ref 30.0–36.0)
MCV: 65.9 fL — ABNORMAL LOW (ref 78.0–100.0)
RBC: 3.76 MIL/uL — ABNORMAL LOW (ref 3.87–5.11)

## 2010-10-06 LAB — COMPREHENSIVE METABOLIC PANEL
CO2: 27 mEq/L (ref 19–32)
Calcium: 8.9 mg/dL (ref 8.4–10.5)
Creatinine, Ser: 0.8 mg/dL (ref 0.4–1.2)
GFR calc non Af Amer: >60 mL/min (ref >60)
Glucose, Bld: 88 mg/dL (ref 70–99)

## 2010-10-06 LAB — CROSSMATCH: ABO/RH(D): O POS

## 2010-10-10 LAB — BASIC METABOLIC PANEL
Calcium: 9.1 mg/dL (ref 8.4–10.5)
Creatinine, Ser: 0.7 mg/dL (ref 0.4–1.2)
GFR calc Af Amer: >60 mL/min (ref >60)
GFR calc non Af Amer: >60 mL/min (ref >60)
Glucose, Bld: 77 mg/dL (ref 70–99)
Sodium: 138 mEq/L (ref 135–145)

## 2010-10-10 LAB — DIFFERENTIAL
Basophils Absolute: 0 10*3/uL (ref 0.0–0.1)
Lymphocytes Relative: 21 % (ref 12–46)
Monocytes Relative: 6 % (ref 3–12)
Neutro Abs: 13.3 10*3/uL — ABNORMAL HIGH (ref 1.7–7.7)
Neutrophils Relative %: 72 % (ref 43–77)

## 2010-10-10 LAB — CBC
Hemoglobin: 8.5 g/dL — ABNORMAL LOW (ref 12.0–15.0)
RDW: 21 % — ABNORMAL HIGH (ref 11.5–15.5)

## 2010-11-08 NOTE — H&P (Signed)
[name], [name]              ACCOUNT NO.:  [id number]   MEDICAL RECORD NO.:  [id number]          PATIENT TYPE:  INP   LOCATION:  4705                         FACILITY:  [facility]   PHYSICIAN:  James L. Oletta Lamas, M.D. DATE OF BIRTH:  1970/10/06   DATE OF ADMISSION:  06/11/2008  DATE OF DISCHARGE:                              HISTORY & PHYSICAL   HISTORY OF PRESENT ILLNESS:  This is a 40 year old female diagnosed with  Crohn's disease in April 2009 by Dr. Wilford Corner.  She reports  having severe epigastric pain that started last Sunday approximately 4  days ago.  She has had such intense burning in her epigastrium that she  has stopped eating.  She has been having dry heaves but no emesis has  been coming up.  She also reports increased number of liquid/semiformed  stool, but cannot give me an exact number.  She tells me that she also  has traces of blood on her toilet tissue.  The patient has required  Dilaudid 1-2 mg q.3 h. for pain in the [facility].  She  has also experienced a decrease in her O2 sats into the 80s.  Her  gastroenterologist is Dr. Wilford Corner.  She has no primary care  physician.   PAST MEDICAL HISTORY:  Significant for Crohn's colitis.  She is status  post Nissen fundoplication procedure and says that she has had no  problems with GERD until last Sunday since her procedure.  She also has  a history of medical noncompliance and not showing up for appointments  in the [place] office.   CURRENT MEDICATIONS:  Include gabapentin, Lexapro, prednisone, Aciphex,  and Asacol.   She has an allergy to IBUPROFEN in that it causes stomach upset.   REVIEW OF SYSTEMS:  Negative for fever and weight loss.  She does report  aches and pains in her knees.   SOCIAL HISTORY:  Positive for 1 packet tobacco a day.  Negative for  alcohol.   FAMILY HISTORY:  Negative for colon cancer.  She is uncertain if there  is any ulcer disease in the family.   PHYSICAL EXAMINATION:  GENERAL:  She is alert and oriented but tired  after being in the emergency room all night.  HEART:  Tachy with no obvious murmurs, gallops, or arrhythmias.  LUNGS:  Clear to auscultation.  ABDOMEN:  Obese, tender in epigastrium, soft, nondistended with good  bowel sounds.   LABORATORY DATA:  Significant for potassium of 2.3, since she has  received two runs of 10 mEq and her potassium is at 2.5.  BUN is 9,  creatinine 0.8.  LFTs are significant only for an alk phos that is  barely elevated at 120.  Her lipase is 38.  White count 15.3, hemoglobin  8.6, hematocrit 27.6, platelet 550,000.  CT of her abdomen done today  shows inflamed distal and terminal ileum, also right colon mucosal  enhancement and submucosal edema.  Of note, she also has a small  calcified gallstone.  She has bilateral kidney stones and right ovarian  cyst.   ASSESSMENT:  Dr. Laurence Spates has seen and examined the patient,  collected history, and reviewed her chart.  His impression is this is a  40 year old female experiencing a Crohn's flare, also with epigastric  burning.  We will  begin our evaluation with an upper endoscopy to evaluate the pain in her  epigastrium and ensure that there is no Crohn's extension into the  esophagus.  We will also check stool for Clostridium difficile, routine  culture, and O&P.  We will start on IV Solu-Medrol, Cipro, Flagyl, and  pain medications as well as Protonix IV b.i.d.      Melton Alar, PA    ______________________________  Joyice Faster. Oletta Lamas, M.D.    MLY/MEDQ  D:  06/11/2008  T:  06/12/2008  Job:  [id number]   cc:   Lear Ng, MD

## 2010-11-08 NOTE — Assessment & Plan Note (Signed)
OFFICE VISIT   Kari Hahn, Kari Hahn  DOB:  03-23-1971                                       08/30/2009  TDDUK#:[id number]   Patient comes back today for follow-up of her recent hospitalization.  She presented to the emergency department with abdominal and back pain.  On CT scan, she was found to have splenic and right renal infarcts.  This was due to aortic thrombus.  The patient also has a history of  Crohn's disease.  She was admitted and monitored very closely, placed on  anticoagulation.  Based on the extensive nature of the thrombus, I  elected to treat her with anticoagulation.  She did improve.  She has  been followed by Dr. Marin Olp of hematology.  She has gone home, and she  is doing much better since she has been at home.  She does have  occasional nausea.  She was also seen by GI in the hospital.  They have  switched her back from Asacol to Pentasa, and her bowel movements have  become more consistent.   PAST MEDICAL HISTORY:  Crohn's disease, hypertension, anemia, anxiety,  aortic thrombus, depression, hyperlipidemia, peripheral neuropathy,  candidal esophagitis.   FAMILY HISTORY:  Negative for cardiovascular at an early age.   SOCIAL HISTORY:  She is married with 2 children.  Smokes 1 pack a day.  Does not drink.   REVIEW OF SYSTEMS:  CARDIAC:  Positive for palpitations, shortness of  breath on exertion.  GENERAL:  Negative.  PULMONARY:  Positive for bronchitis.  GI:  Positive for diarrhea.  GU:  Positive for frequent urination.  VASCULAR:  Positive for pain in legs when walking and when lying flat.  NEURO:  Positive for headaches.  MUSCULOSKELETAL:  Positive for joint pain.  PSYCH:  Positive for depression, anxiety.  EENT:  Negative.  HEMATOLOGY:  Positive for anemia and clotting disorders.  SKIN:  Negative.   PHYSICAL EXAMINATION:  Heart rate 109, blood pressure 103/69,  temperature 98.7.  general:  She is well-appearing in no  distress.  HEENT:  Within normal limits.  Lungs are clear bilaterally.  Cardiovascular:  Regular rate and rhythm.  No murmur.  No carotid  bruits.  Extremities are warm and well-perfused.  Abdomen is soft and  nontender.  Musculoskeletal is without major deformities.  Neuro:  She  has no focal weakness or deficits.  Skin:  She has ecchymosis from her  Arixtra incisions in her lower abdomen.   DIAGNOSTIC STUDIES:  I have independently reviewed her CT scan.  There  has been improvement since her prior study.  The infarcts in the kidney  and spleen are less impressive.  The thrombus burden within the aorta  has decreased.   ASSESSMENT/PLAN:  Aortic thrombus.   PLAN:  I have reiterated to the patient that I think she is  hypercoagulable because of her Crohn's disease and for that reason, I  would recommend lifelong anticoagulation.  Dr. Marin Olp has been  following her, and she has been on Arixtra.  We will consider switching  her over to an oral equivalent in several weeks.   With regards to her Crohn's disease, her medications are being managed  by [facility].  This appears to be stable.   I will plan on seeing the patient back in 3 months with a repeat CT  scan.  Eldridge Abrahams, MD  Electronically Signed   VWB/MEDQ  D:  08/30/2009  T:  08/30/2009  Job:  [id number]   cc:   Rudell Cobb. Marin Olp, M.D.  Dr. Michail Sermon

## 2010-11-08 NOTE — Op Note (Signed)
Kari Hahn, Kari Hahn              ACCOUNT NO.:  [id number]   MEDICAL RECORD NO.:  [id number]          PATIENT TYPE:  INP   LOCATION:  3016                         FACILITY:  [facility]   PHYSICIAN:  James L. Rolla Flatten., M.D.DATE OF BIRTH:  1971/05/17   DATE OF PROCEDURE:  06/12/2008  DATE OF DISCHARGE:                               OPERATIVE REPORT   PROCEDURE:  Esophagogastroduodenoscopy and biopsy.   MEDICATIONS:  Cetacaine spray, Phenergan 25 mg, fentanyl 100 mcg, Versed  10 mg IV.   INDICATION:  A woman with Crohn's with severe epigastric chest pain.   DESCRIPTION OF PROCEDURE:  Procedure had been explained to the patient  and consent obtained.  In left lateral decubitus position, the scope was  inserted blindly.  The patient was very uncooperative and belligerent.  She had ulcerations in her esophagus, could be passed down into the  stomach and the duodenum was entered.  Pylorus identified and passed.  The duodenum including the bulb and second portion were normal.  The  scope was withdrawn back in the stomach.  There were diffuse small  ulcerations in the stomach.  There was shallow really more erosions.  These were biopsied and placed in jar #1.  The patient did have a  Nissen.  This was okay in the retroflex view.  The scope was withdrawn  back in the esophagus.  The patient had multiple shallow linear ulcers.  They were at times confluent extending up from the GE junction up into  the proximal esophagus.  Several biopsies were obtained, they were quite  friable.  Scope was withdrawn.  The patient tolerated the procedure  fairly well and was somewhat uncooperative throughout the procedure.   ASSESSMENT:  1. Diffuse ulcerations in the esophagus could be viral, Crohn's, it is      difficult to tell.  We will have to wait for the biopsies.  2. Diffuse gastric erosions, again of unclear etiology.   PLAN:  We add Carafate Slurry, continue to treat her active Crohn  disease as  manifested on CT scan.           ______________________________  Joyice Faster. Rolla Flatten., M.D.     Kari Hahn  D:  06/12/2008  T:  06/12/2008  Job:  [id number]   cc:   Lear Ng, MD

## 2010-11-08 NOTE — Consult Note (Signed)
NAMEMarland Kitchen  Kari Hahn, Kari Hahn NO.:  [id number]   MEDICAL RECORD NO.:  [id number]          PATIENT TYPE:  EMS   LOCATION:  MAJO                         FACILITY:  [facility]   PHYSICIAN:  Judeth Cornfield. Scot Dock, M.D.DATE OF BIRTH:  15-Apr-1971   DATE OF CONSULTATION:  DATE OF DISCHARGE:  07/04/2008                                 CONSULTATION   REASON FOR CONSULTATION:  Pain in both feet.   HISTORY:  This is a pleasant 40 year old woman who 2 weeks ago noted the  gradual onset of pain in both feet.  She noted that the pain was more  significant on the left side.  Subsequently, she noticed some  discoloration in her left third, fourth, and fifth toes.  She ultimately  presented to the emergency department in [facility], and the emergency  physician there evaluated the patient today and past asked that Vascular  Surgery be consulted and she was sent, therefore, to the [facility] Emergency  Department.  Prior to developing this pain in her feet 2 weeks ago, she  does admit to bilateral lower extremity calf claudication, which occurs  at a fairly short distance.  I do not get any history of rest pain  except for the pain she is having in her toes since her most recent  symptoms began 2 weeks ago.  She has had no history of nonhealing  wounds.  She states the claudication symptoms in her calves has been  going on for months.  She cannot be more specific than that though  symptoms have been stable.   Her past medical history is significant for:  1. Crohn disease.  2. She recently was diagnosed with esophageal ulcers.  3. She also had mildly elevated blood pressure.  4. She denies any history of diabetes, hypercholesterolemia, history      of previous myocardial infarction, history of congestive heart      failure, history of COPD.   Past surgical history is significant for:  1. Two previous C-sections.  2. Lap Nissen.  3. Surgery on her left knee.   MEDICATIONS:  1. Asacol 4  tablets 400 mg t.i.d.  2. Prednisone 40 mg p.o. daily.  3. Aciphex 20 mg p.o. b.i.d.  4. K-Dur 20 mEq p.o. q.a.m.  5. Lexapro 20 mg p.o. daily.  6. Gabapentin 600 mg p.o. daily.   ALLERGIES:  No known drug allergies.   SOCIAL HISTORY:  She is married.  She has 2 children.  She smokes a pack  per day of cigarettes and has been smoking since she was 12.   FAMILY HISTORY:  She is unaware of any history of premature  cardiovascular disease.   REVIEW OF SYSTEMS:  GENERAL:  She has had no recent weight loss, weight  gain, problems with her appetite.  CARDIAC:  She has had no chest pain  except for some mild chest pressure at times.  She also admits to  palpitations at times.  She denies orthopnea.  She does admit to dyspnea  on exertion.  PULMONARY:  She has had no recent productive cough,  bronchitis, asthma, or  wheezing.  GI:  She has a history of Crohn's and  has had diarrhea in the past, although recently this has been better.  She was recently diagnosed with some esophageal ulcers; however, these  recently have not been causing symptoms.  GU:  She has had a yeast  infection, recently there has been no dysuria or frequency.  VASCULAR:  She has had claudication of both calves.  No rest pain, no nonhealing  ulcers.  She denies any history of stroke, TIAs, or amaurosis fugax.  She has had no history of DVT or phlebitis.  NEURO:  She has had no  dizziness, blackouts, or seizures.  She does have occasional headaches.  HEMATOLOGIC:  She has had no bleeding problems or clotting disorders  that she is aware of.  ENT:  She has had no recent change in her  eyesight or vision.   PHYSICAL EXAMINATION:  This is a pleasant 40 year old woman who appears  her stated age.  Her blood pressure is 112/68.  Her heart rate is 78.  Neck is supple.  There is no cervical lymphadenopathy.  She does have a  left carotid bruit.  HEENT is unremarkable.  Lungs are clear bilaterally  to auscultation.  On cardiac  exam, she has a regular rate and rhythm  without murmur appreciated.  The abdomen is soft and nontender.  She has  normal pitched bowel sounds.  I cannot palpate an aneurysm.  She has  normal femoral popliteal, dorsalis pedis, and posterior tibial pulses  bilaterally.  I do not appreciate any femoral bruits.  She has no  significant lower extremity swelling.  Neurologic exam is nonfocal with  good strength in her upper extremities and lower extremities  bilaterally.  She has some bluish discoloration of the left third,  fourth, and fifth toes consistent with atheroembolic disease.  She has  minimal discoloration of the plantar aspect of her right fifth toe.  She  had biphasic Doppler signals in both feet with warm, well-perfused feet.   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  She does have palpable pedal pulses with biphasic Doppler signals in  both feet and adequate perfuse of the feet.  I had a long discussion  with her today about the importance of tobacco cessation and  relationship of nicotine to vasospasm.  I have explained that there is  really not much to do for the pain.  I am sure of control it with  narcotics for now and that this should gradually improve with time given  that she has normal circulation.  I have recommend we proceed with an  arteriogram to evaluate for potential source of atheroembolic disease to  prevent further episodes in the future.  I will start her on Plavix as  we do ultimately find a stenosis amenable to angioplasty and stenting.  This will help the patency of the stent.  She has previous appointments  and did not want to schedule her arteriogram until July 20, 2008.  If  symptoms progress before that, she will call the office and I will  evaluate her sooner.  She will also need a carotid duplex scan and she  see me in the office, as she does have a  left carotid bruit.      Judeth Cornfield. Scot Dock, M.D.  Electronically Signed     CSD/MEDQ  D:  07/04/2008  T:  07/04/2008  Job:  [id number]   cc:   Ashby Dawes. Polite, M.D.

## 2010-11-08 NOTE — Discharge Summary (Signed)
NAMESAMAMTHA, TIEGS              ACCOUNT NO.:  [id number]   MEDICAL RECORD NO.:  [id number]          PATIENT TYPE:  INP   LOCATION:  3016                         FACILITY:  [facility]   PHYSICIAN:  John C. Amedeo Plenty, M.D.    DATE OF BIRTH:  Oct 07, 1970   DATE OF ADMISSION:  06/11/2008  DATE OF DISCHARGE:  06/13/2008                               DISCHARGE SUMMARY   ADMIT DIAGNOSIS:  Epigastric pain x4 days.   DISCHARGE DIAGNOSES:  1. Ulcerations of the esophagus and gastrium, pathology consistent      with Candida.  2. Crohn colitis flare.  3. History of Nissen fundoplication procedure.  4. History of noncompliance with medical office appointments.   CONSULTS:  None.   PROCEDURES:  Upper endoscopy done on December 18 by Dr. Laurence Spates.  Impression is as follows; diffuse ulcerations in the esophagus as well  as the gastrium, could be viral.   PATHOLOGY RESULTS:  1. Moderate chronic active gastritis.  2. Esophageal ulcerations, fungal organisms consistent with Candida      species.   RADIOLOGICAL EXAM:  CT of her abdomen and pelvis done on December 17  showed enhancement of the right colon along with submucosal edema,  scattered borderline mesenteric and retroperitoneal lymph nodes with a  prominent cluster near the cecum, findings consistent with Crohn  colitis, also small calcified gallstones, small bilateral renal calculi,  stable borderline enlarged mesenteric lymph nodes, right ovarian cyst.   BRIEF HISTORY AND HOSPITAL COURSE:  Ms. Fasnacht is a 40 year old female  diagnosed with Crohn disease in April 2009 by Dr. Michail Sermon.  On  admission, she reported a 4-day history of intense burning in her  epigastrium to the point where it stopped her from eating.  She had also  been having traces of red blood on her toilet tissue and had required  Dilaudid q.3 h. for pain overnight at the [facility].  She had a decrease in her oxygen saturations into the 80s which was  quickly corrected.  She was admitted into [facility], placed on  clear liquid diet and given pain medications.  She was scheduled for an  upper endoscopy the following day.  CT scan showed active Crohn's in the  area of her right colon and cecum.  Upper endoscopy the following day  was as described above.  Following her endoscopy, she was able to  tolerate a full liquid diet.  Her diet was advanced as tolerated.  On  June 13, 2008, the patient was requesting discharge.  It was felt  that she was in stable condition and able to be discharged safely to  home.   Pertinent labs on June 13, 2008, date of discharge, hemoglobin was  8.1, hematocrit 26.2, white count 14.7, platelets 455,000.  She had a C.  diff toxin that was negative during her stay.  BMET on June 13, 2008, was completely within normal limits other than her glucose, which  was 162.  Amylase and lipase were normal during her stay.  She was  scheduled up for a followup office appointment with Dr. Michail Sermon  on  June 16, 2008.   DISCHARGE MEDICATIONS:  1. Asacol 4 tablets of 400 mg each t.i.d.  2. Prednisone 40 mg daily.  3. Aciphex 20 mg twice a day.  4. K-Dur 20 mg each morning.  5. Lexapro 20 mg daily.  6. Gabapentin 600 mg daily.      Melton Alar, PA    ______________________________  Elyse Jarvis Amedeo Plenty, M.D.    MLY/MEDQ  D:  06/22/2008  T:  06/22/2008  Job:  [id number]   cc:   Lear Ng, MD  Ashby Dawes. Polite, M.D.  Jeneen Rinks Little

## 2010-11-14 ENCOUNTER — Other Ambulatory Visit: Payer: Self-pay | Admitting: Hematology & Oncology

## 2010-11-14 ENCOUNTER — Encounter (HOSPITAL_BASED_OUTPATIENT_CLINIC_OR_DEPARTMENT_OTHER): Payer: PRIVATE HEALTH INSURANCE | Admitting: Hematology & Oncology

## 2010-11-14 DIAGNOSIS — N920 Excessive and frequent menstruation with regular cycle: Secondary | ICD-10-CM

## 2010-11-14 DIAGNOSIS — Z7901 Long term (current) use of anticoagulants: Secondary | ICD-10-CM

## 2010-11-14 DIAGNOSIS — I7411 Embolism and thrombosis of thoracic aorta: Secondary | ICD-10-CM

## 2010-11-14 DIAGNOSIS — D473 Essential (hemorrhagic) thrombocythemia: Secondary | ICD-10-CM

## 2010-11-14 DIAGNOSIS — D509 Iron deficiency anemia, unspecified: Secondary | ICD-10-CM

## 2010-11-14 LAB — CBC WITH DIFFERENTIAL (CANCER CENTER ONLY)
BASO#: 0.1 10*3/uL (ref 0.0–0.2)
Eosinophils Absolute: 0.4 10*3/uL (ref 0.0–0.5)
HGB: 12.6 g/dL (ref 11.6–15.9)
LYMPH#: 2.8 10*3/uL (ref 0.9–3.3)
MCH: 30.1 pg (ref 26.0–34.0)
MONO#: 0.9 10*3/uL (ref 0.1–0.9)
NEUT#: 9.2 10*3/uL — ABNORMAL HIGH (ref 1.5–6.5)
RBC: 4.18 10*6/uL (ref 3.70–5.32)
WBC: 13.3 10*3/uL — ABNORMAL HIGH (ref 3.9–10.0)

## 2010-11-14 LAB — PROTIME-INR (CHCC SATELLITE)
INR: 1.2 — ABNORMAL LOW (ref 2.0–3.5)
Protime: 14.4 Seconds — ABNORMAL HIGH (ref 10.6–13.4)

## 2010-11-22 ENCOUNTER — Encounter (HOSPITAL_BASED_OUTPATIENT_CLINIC_OR_DEPARTMENT_OTHER): Payer: PRIVATE HEALTH INSURANCE | Admitting: Hematology & Oncology

## 2010-11-22 ENCOUNTER — Other Ambulatory Visit: Payer: Self-pay | Admitting: Hematology & Oncology

## 2010-11-22 DIAGNOSIS — I7411 Embolism and thrombosis of thoracic aorta: Secondary | ICD-10-CM

## 2010-11-22 DIAGNOSIS — N920 Excessive and frequent menstruation with regular cycle: Secondary | ICD-10-CM

## 2010-11-22 DIAGNOSIS — Z7901 Long term (current) use of anticoagulants: Secondary | ICD-10-CM

## 2010-11-22 DIAGNOSIS — D509 Iron deficiency anemia, unspecified: Secondary | ICD-10-CM

## 2010-11-22 LAB — PROTIME-INR (CHCC SATELLITE)
INR: 1.3 — ABNORMAL LOW (ref 2.0–3.5)
Protime: 15.6 Seconds — ABNORMAL HIGH (ref 10.6–13.4)

## 2010-12-06 ENCOUNTER — Encounter (HOSPITAL_BASED_OUTPATIENT_CLINIC_OR_DEPARTMENT_OTHER): Payer: PRIVATE HEALTH INSURANCE | Admitting: Hematology & Oncology

## 2010-12-06 ENCOUNTER — Other Ambulatory Visit: Payer: Self-pay | Admitting: Hematology & Oncology

## 2011-02-13 ENCOUNTER — Encounter (HOSPITAL_BASED_OUTPATIENT_CLINIC_OR_DEPARTMENT_OTHER): Payer: PRIVATE HEALTH INSURANCE | Admitting: Hematology & Oncology

## 2011-02-13 ENCOUNTER — Other Ambulatory Visit: Payer: Self-pay | Admitting: Hematology & Oncology

## 2011-02-13 DIAGNOSIS — D509 Iron deficiency anemia, unspecified: Secondary | ICD-10-CM

## 2011-02-13 DIAGNOSIS — Z7901 Long term (current) use of anticoagulants: Secondary | ICD-10-CM

## 2011-02-13 DIAGNOSIS — I7411 Embolism and thrombosis of thoracic aorta: Secondary | ICD-10-CM

## 2011-02-13 DIAGNOSIS — N920 Excessive and frequent menstruation with regular cycle: Secondary | ICD-10-CM

## 2011-02-13 LAB — IRON AND TIBC
Iron: 14 ug/dL — ABNORMAL LOW (ref 42–145)
TIBC: 401 ug/dL (ref 250–470)
UIBC: 387 ug/dL

## 2011-02-13 LAB — PROTIME-INR (CHCC SATELLITE): INR: 3.5 (ref 2.0–3.5)

## 2011-02-13 LAB — CBC WITH DIFFERENTIAL (CANCER CENTER ONLY)
BASO#: 0.1 10*3/uL (ref 0.0–0.2)
BASO%: 0.6 % (ref 0.0–2.0)
EOS%: 1.9 % (ref 0.0–7.0)
HCT: 32.9 % — ABNORMAL LOW (ref 34.8–46.6)
HGB: 11.1 g/dL — ABNORMAL LOW (ref 11.6–15.9)
LYMPH#: 1.9 10*3/uL (ref 0.9–3.3)
MONO#: 0.6 10*3/uL (ref 0.1–0.9)
NEUT#: 11.2 10*3/uL — ABNORMAL HIGH (ref 1.5–6.5)
NEUT%: 79.5 % (ref 39.6–80.0)
RDW: 14.9 % (ref 11.1–15.7)
WBC: 14 10*3/uL — ABNORMAL HIGH (ref 3.9–10.0)

## 2011-02-22 ENCOUNTER — Encounter (HOSPITAL_BASED_OUTPATIENT_CLINIC_OR_DEPARTMENT_OTHER): Payer: PRIVATE HEALTH INSURANCE | Admitting: Hematology & Oncology

## 2011-02-22 DIAGNOSIS — D509 Iron deficiency anemia, unspecified: Secondary | ICD-10-CM

## 2011-03-23 LAB — CROSSMATCH: Antibody Screen: NEGATIVE

## 2011-03-29 LAB — COMPREHENSIVE METABOLIC PANEL
ALT: 8
BUN: 9
CO2: 27
Calcium: 9.5
Creatinine, Ser: 0.8
GFR calc non Af Amer: >60
Glucose, Bld: 99
Total Protein: 7.1

## 2011-03-29 LAB — DIFFERENTIAL
Basophils Relative: 1
Eosinophils Relative: 2
Lymphocytes Relative: 20
Monocytes Absolute: 1
Monocytes Relative: 7
Neutrophils Relative %: 70

## 2011-03-29 LAB — CBC
HCT: 28.4 — ABNORMAL LOW
Hemoglobin: 9 — ABNORMAL LOW
MCHC: 31.6
MCV: 68.1 — ABNORMAL LOW
RBC: 4.17
RDW: 17.6 — ABNORMAL HIGH

## 2011-03-29 LAB — LIPASE, BLOOD: Lipase: 44

## 2011-03-31 LAB — COMPREHENSIVE METABOLIC PANEL
ALT: 18 U/L (ref 0–35)
AST: 75 U/L — ABNORMAL HIGH (ref 0–37)
Albumin: 3.8 g/dL (ref 3.5–5.2)
Alkaline Phosphatase: 120 U/L — ABNORMAL HIGH (ref 39–117)
BUN: 9 mg/dL (ref 6–23)
BUN: 5 mg/dL — ABNORMAL LOW (ref 6–23)
CO2: 30 mEq/L (ref 19–32)
CO2: 31 mEq/L (ref 19–32)
CO2: 29 mEq/L (ref 19–32)
Calcium: 8.8 mg/dL (ref 8.4–10.5)
Calcium: 8.5 mg/dL (ref 8.4–10.5)
Chloride: 99 mEq/L (ref 96–112)
Chloride: 99 mEq/L (ref 96–112)
Chloride: 102 mEq/L (ref 96–112)
Creatinine, Ser: 0.8 mg/dL (ref 0.4–1.2)
Creatinine, Ser: 0.75 mg/dL (ref 0.4–1.2)
GFR calc Af Amer: >60 mL/min (ref >60)
GFR calc Af Amer: >60 mL/min (ref >60)
GFR calc non Af Amer: >60 mL/min (ref >60)
GFR calc non Af Amer: >60 mL/min (ref >60)
GFR calc non Af Amer: >60 mL/min (ref >60)
Glucose, Bld: 88 mg/dL (ref 70–99)
Potassium: 2.5 mEq/L — CL (ref 3.5–5.1)
Sodium: 138 mEq/L (ref 135–145)
Total Bilirubin: 0.3 mg/dL (ref 0.3–1.2)
Total Bilirubin: 0.2 mg/dL — ABNORMAL LOW (ref 0.3–1.2)

## 2011-03-31 LAB — BASIC METABOLIC PANEL
BUN: 12 mg/dL (ref 6–23)
BUN: 4 mg/dL — ABNORMAL LOW (ref 6–23)
CO2: 26 mEq/L (ref 19–32)
Calcium: 7.9 mg/dL — ABNORMAL LOW (ref 8.4–10.5)
Calcium: 8.7 mg/dL (ref 8.4–10.5)
Creatinine, Ser: 0.83 mg/dL (ref 0.4–1.2)
GFR calc non Af Amer: >60 mL/min (ref >60)
GFR calc non Af Amer: >60 mL/min (ref >60)
Glucose, Bld: 162 mg/dL — ABNORMAL HIGH (ref 70–99)
Glucose, Bld: 229 mg/dL — ABNORMAL HIGH (ref 70–99)
Potassium: 2.7 mEq/L — CL (ref 3.5–5.1)
Sodium: 128 mEq/L — ABNORMAL LOW (ref 135–145)

## 2011-03-31 LAB — CBC
HCT: 27.6 % — ABNORMAL LOW (ref 36.0–46.0)
HCT: 24.2 % — ABNORMAL LOW (ref 36.0–46.0)
HCT: 26.2 % — ABNORMAL LOW (ref 36.0–46.0)
Hemoglobin: 8.1 g/dL — ABNORMAL LOW (ref 12.0–15.0)
Hemoglobin: 8.6 g/dL — ABNORMAL LOW (ref 12.0–15.0)
MCHC: 30.4 g/dL (ref 30.0–36.0)
MCV: 66.3 fL — ABNORMAL LOW (ref 78.0–100.0)
MCV: 66.9 fL — ABNORMAL LOW (ref 78.0–100.0)
Platelets: 532 10*3/uL — ABNORMAL HIGH (ref 150–400)
Platelets: 550 10*3/uL — ABNORMAL HIGH (ref 150–400)
Platelets: 455 10*3/uL — ABNORMAL HIGH (ref 150–400)
RBC: 4.16 MIL/uL (ref 3.87–5.11)
RBC: 3.62 MIL/uL — ABNORMAL LOW (ref 3.87–5.11)
RDW: 19.2 % — ABNORMAL HIGH (ref 11.5–15.5)
RDW: 20.7 % — ABNORMAL HIGH (ref 11.5–15.5)
WBC: 15.3 10*3/uL — ABNORMAL HIGH (ref 4.0–10.5)
WBC: 16.3 10*3/uL — ABNORMAL HIGH (ref 4.0–10.5)
WBC: 13.5 10*3/uL — ABNORMAL HIGH (ref 4.0–10.5)
WBC: 14.7 10*3/uL — ABNORMAL HIGH (ref 4.0–10.5)

## 2011-03-31 LAB — DIFFERENTIAL
Basophils Absolute: 0 10*3/uL (ref 0.0–0.1)
Basophils Absolute: 0 10*3/uL (ref 0.0–0.1)
Basophils Relative: 0 % (ref 0–1)
Eosinophils Absolute: 0.2 10*3/uL (ref 0.0–0.7)
Lymphocytes Relative: 17 % (ref 12–46)
Lymphs Abs: 1.6 10*3/uL (ref 0.7–4.0)
Monocytes Absolute: 0.9 10*3/uL (ref 0.1–1.0)
Monocytes Relative: 4 % (ref 3–12)
Neutro Abs: 13.8 10*3/uL — ABNORMAL HIGH (ref 1.7–7.7)
Neutrophils Relative %: 76 % (ref 43–77)
Smear Review: INCREASED

## 2011-03-31 LAB — CLOSTRIDIUM DIFFICILE EIA: C difficile Toxins A+B, EIA: NEGATIVE

## 2011-03-31 LAB — CROSSMATCH
ABO/RH(D): O POS
Antibody Screen: NEGATIVE

## 2011-03-31 LAB — LIPASE, BLOOD: Lipase: 38 U/L (ref 23–300)

## 2011-04-18 ENCOUNTER — Encounter: Payer: Self-pay | Admitting: *Deleted

## 2011-04-26 ENCOUNTER — Telehealth: Payer: Self-pay | Admitting: Hematology & Oncology

## 2011-05-01 NOTE — Telephone Encounter (Signed)
Pt aware.

## 2011-05-10 ENCOUNTER — Other Ambulatory Visit: Payer: PRIVATE HEALTH INSURANCE | Admitting: Lab

## 2011-05-10 ENCOUNTER — Ambulatory Visit: Payer: PRIVATE HEALTH INSURANCE | Admitting: Hematology & Oncology

## 2011-05-16 ENCOUNTER — Other Ambulatory Visit: Payer: Self-pay | Admitting: Family

## 2011-05-16 ENCOUNTER — Ambulatory Visit (HOSPITAL_BASED_OUTPATIENT_CLINIC_OR_DEPARTMENT_OTHER): Payer: PRIVATE HEALTH INSURANCE | Admitting: Hematology & Oncology

## 2011-05-16 ENCOUNTER — Other Ambulatory Visit (HOSPITAL_BASED_OUTPATIENT_CLINIC_OR_DEPARTMENT_OTHER): Payer: PRIVATE HEALTH INSURANCE | Admitting: Lab

## 2011-05-16 VITALS — BP 104/70 | HR 86 | Temp 98.0°F | Ht 62.0 in | Wt 219.0 lb

## 2011-05-16 DIAGNOSIS — D509 Iron deficiency anemia, unspecified: Secondary | ICD-10-CM

## 2011-05-16 DIAGNOSIS — Z7901 Long term (current) use of anticoagulants: Secondary | ICD-10-CM

## 2011-05-16 DIAGNOSIS — I7410 Embolism and thrombosis of unspecified parts of aorta: Secondary | ICD-10-CM

## 2011-05-16 DIAGNOSIS — I7411 Embolism and thrombosis of thoracic aorta: Secondary | ICD-10-CM

## 2011-05-16 DIAGNOSIS — D473 Essential (hemorrhagic) thrombocythemia: Secondary | ICD-10-CM

## 2011-05-16 LAB — CBC WITH DIFFERENTIAL (CANCER CENTER ONLY)
BASO#: 0.1 10*3/uL (ref 0.0–0.2)
BASO%: 0.4 % (ref 0.0–2.0)
EOS%: 1.9 % (ref 0.0–7.0)
HGB: 13.5 g/dL (ref 11.6–15.9)
MCH: 32.5 pg (ref 26.0–34.0)
MCHC: 34.5 g/dL (ref 32.0–36.0)
MONO%: 5.5 % (ref 0.0–13.0)
NEUT#: 10.4 10*3/uL — ABNORMAL HIGH (ref 1.5–6.5)
RDW: 17.6 % — ABNORMAL HIGH (ref 11.1–15.7)

## 2011-05-16 LAB — PROTIME-INR (CHCC SATELLITE): INR: 1.9 — ABNORMAL LOW (ref 2.0–3.5)

## 2011-05-16 LAB — IRON AND TIBC
%SAT: 8 % — ABNORMAL LOW (ref 20–55)
Iron: 31 ug/dL — ABNORMAL LOW (ref 42–145)
TIBC: 398 ug/dL (ref 250–470)
TIBC: 398 ug/dL (ref 250–470)

## 2011-05-16 NOTE — Patient Instructions (Signed)
Current Outpatient Prescriptions  Medication Sig Dispense Refill  . ALPRAZolam (XANAX) 0.5 MG tablet Take 0.5 mg by mouth at bedtime as needed.        Marland Kitchen amLODipine-valsartan (EXFORGE) 5-160 MG per tablet Take 1 tablet by mouth daily.        . B Complex-C (B-COMPLEX WITH VITAMIN C) tablet Take 1 tablet by mouth daily.        . cetirizine (ZYRTEC) 10 MG tablet Take 10 mg by mouth daily.        . cholecalciferol (VITAMIN D) 1000 UNITS tablet Take 1,000 Units by mouth daily.        Marland Kitchen escitalopram (LEXAPRO) 20 MG tablet Take 20 mg by mouth daily.        . folic acid (FOLVITE) 1 MG tablet Take 1 mg by mouth daily.        Marland Kitchen gabapentin (NEURONTIN) 600 MG tablet Take 600 mg by mouth 3 (three) times daily.        Marland Kitchen HYDROcodone-acetaminophen (MAXIDONE) 10-750 MG per tablet Take 1 tablet by mouth every 8 (eight) hours as needed.        . mesalamine (PENTASA) 500 MG CR capsule Take 1,000 mg by mouth 4 (four) times daily.       . potassium chloride (KLOR-CON) 10 MEQ CR tablet Take 10 mEq by mouth daily.        . simvastatin (ZOCOR) 20 MG tablet Take 20 mg by mouth at bedtime.        Marland Kitchen warfarin (COUMADIN) 5 MG tablet Take 7.5 mg by mouth daily. Takes 7.5 mg every day except Tues and Thurs, on these days, takes 10 mg. Teola Bradley, Floy Angert La[place]

## 2011-05-16 NOTE — Progress Notes (Signed)
CC:   Kari Ng, MD V. Kari Hahn IV, MD  DIAGNOSIS: 1. Idiopathic aortic thrombosis. 2. Iron-deficiency anemia.  CURRENT THERAPY: 1. IV iron as indicated. 2. Coumadin 7.5 mg alternating with 10 mg p.o. daily.  INTERIM HISTORY:  Kari Hahn comes in for followup.  She is doing okay. She is having a lot of joint issues.  She says she gets some joint flare- ups.  I told her to try some over-the-counter Zostrix cream on the joints when needed.  She is having no bleeding.  She is taking Coumadin regularly.  She is very diligent with this.  We last gave her iron back in, I think, August.  At that point in time, her iron saturation was only 3%.  She got Feraheme at 1020 mg.  She has had no cough.  She, unfortunately, is smoking 2 packs a day of cigarettes.  There is a lot of stress at home.  There has been no leg swelling.  She has had no rashes.  PHYSICAL EXAMINATION:  General:  This is a well-developed, well- nourished white female in no obvious distress.  Vital Signs:  Show a temperature of 98, pulse 86, respiratory rate 20, blood pressure 104/70, weight is 219.  Head and Neck Exam:  Shows a normocephalic, atraumatic skull.  There are no ocular or oral lesions.  There are no palpable cervical or supraclavicular lymph nodes.  Lungs:  Clear to percussion and auscultation bilaterally.  Cardiac Exam:  Regular rate and rhythm with a normal S1 and S2.  There are no murmurs, rubs or bruits. Abdominal Exam:  Soft with good bowel sounds.  There is no palpable abdominal mass.  There is no fluid wave.  There is no palpable hepatosplenomegaly.  Back Exam:  No tenderness over the spine, ribs or hips.  Extremities:  Show no clubbing, cyanosis, or edema.  She had good pulses in distal extremities.  LABORATORY STUDIES:  White cell count is 13.4, hemoglobin 13.5, hematocrit 39.1, platelet count 495.  INR is 1.9.  IMPRESSION:  Kari Hahn is a 40 year old white female with an  idiopathic aortic thrombosis.  She presented in February 2011.  She is basically on lifelong Coumadin.  I just feel this is something that is necessary for her.  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  A lot of this is because of iron deficiency.  She becomes iron deficient every now and then.  It is hard to say why she does become iron deficient.  There has been no GI bleeding.  I think she has had a GI evaluation.  I do want to have her come back in a month so that we can recheck her INR.  I do want to keep her INR between 2.5-3.5.  I will see her back myself in about 2 months or so.    ______________________________ Volanda Napoleon, M.D. PRE/MEDQ  D:  05/16/2011  T:  05/16/2011  Job:  [id number]  ADDENDUM:  Ferritin is 8!!! Need IV Shirlean Kelly

## 2011-05-16 NOTE — Progress Notes (Signed)
This office note has been dictated.

## 2011-06-15 ENCOUNTER — Other Ambulatory Visit: Payer: Self-pay | Admitting: Family

## 2011-06-15 ENCOUNTER — Other Ambulatory Visit: Payer: Self-pay | Admitting: *Deleted

## 2011-06-15 ENCOUNTER — Other Ambulatory Visit (HOSPITAL_BASED_OUTPATIENT_CLINIC_OR_DEPARTMENT_OTHER): Payer: PRIVATE HEALTH INSURANCE | Admitting: Lab

## 2011-06-15 DIAGNOSIS — D509 Iron deficiency anemia, unspecified: Secondary | ICD-10-CM

## 2011-06-15 DIAGNOSIS — N920 Excessive and frequent menstruation with regular cycle: Secondary | ICD-10-CM

## 2011-06-15 DIAGNOSIS — Z7901 Long term (current) use of anticoagulants: Secondary | ICD-10-CM

## 2011-06-15 DIAGNOSIS — I7411 Embolism and thrombosis of thoracic aorta: Secondary | ICD-10-CM

## 2011-06-15 LAB — PROTIME-INR (CHCC SATELLITE): INR: 1.5 — ABNORMAL LOW (ref 2.0–3.5)

## 2011-06-23 ENCOUNTER — Other Ambulatory Visit: Payer: Self-pay | Admitting: Hematology & Oncology

## 2011-06-23 ENCOUNTER — Other Ambulatory Visit (HOSPITAL_BASED_OUTPATIENT_CLINIC_OR_DEPARTMENT_OTHER): Payer: PRIVATE HEALTH INSURANCE | Admitting: Lab

## 2011-06-23 ENCOUNTER — Ambulatory Visit (HOSPITAL_BASED_OUTPATIENT_CLINIC_OR_DEPARTMENT_OTHER): Payer: PRIVATE HEALTH INSURANCE

## 2011-06-23 VITALS — BP 118/69 | HR 80 | Temp 97.0°F

## 2011-06-23 DIAGNOSIS — I7410 Embolism and thrombosis of unspecified parts of aorta: Secondary | ICD-10-CM

## 2011-06-23 DIAGNOSIS — I7411 Embolism and thrombosis of thoracic aorta: Secondary | ICD-10-CM

## 2011-06-23 DIAGNOSIS — D509 Iron deficiency anemia, unspecified: Secondary | ICD-10-CM

## 2011-06-23 LAB — CBC WITH DIFFERENTIAL (CANCER CENTER ONLY)
EOS%: 2 % (ref 0.0–7.0)
MCH: 30.4 pg (ref 26.0–34.0)
MCHC: 32.6 g/dL (ref 32.0–36.0)
MONO%: 6.6 % (ref 0.0–13.0)
NEUT#: 10.4 10*3/uL — ABNORMAL HIGH (ref 1.5–6.5)
Platelets: 583 10*3/uL — ABNORMAL HIGH (ref 145–400)

## 2011-06-23 LAB — PROTIME-INR (CHCC SATELLITE)
INR: 2.1 (ref 2.0–3.5)
Protime: 25.2 Seconds — ABNORMAL HIGH (ref 10.6–13.4)

## 2011-06-23 MED ORDER — SODIUM CHLORIDE 0.9 % IV SOLN
1020.0000 mg | Freq: Once | INTRAVENOUS | Status: AC
  Administered 2011-06-23 (×2): 1020 mg via INTRAVENOUS
  Filled 2011-06-23: qty 34

## 2011-06-23 MED ORDER — SODIUM CHLORIDE 0.9 % IV SOLN
Freq: Once | INTRAVENOUS | Status: AC
  Administered 2011-06-23: 15:00:00 via INTRAVENOUS

## 2011-07-31 ENCOUNTER — Ambulatory Visit (HOSPITAL_BASED_OUTPATIENT_CLINIC_OR_DEPARTMENT_OTHER): Payer: PRIVATE HEALTH INSURANCE | Admitting: Hematology & Oncology

## 2011-07-31 ENCOUNTER — Other Ambulatory Visit (HOSPITAL_BASED_OUTPATIENT_CLINIC_OR_DEPARTMENT_OTHER): Payer: PRIVATE HEALTH INSURANCE | Admitting: Lab

## 2011-07-31 DIAGNOSIS — I7410 Embolism and thrombosis of unspecified parts of aorta: Secondary | ICD-10-CM

## 2011-07-31 DIAGNOSIS — I7411 Embolism and thrombosis of thoracic aorta: Secondary | ICD-10-CM

## 2011-07-31 DIAGNOSIS — L739 Follicular disorder, unspecified: Secondary | ICD-10-CM

## 2011-07-31 LAB — PROTIME-INR (CHCC SATELLITE)
INR: 4.9 — ABNORMAL HIGH (ref 2.0–3.5)
Protime: 58.8 Seconds — ABNORMAL HIGH (ref 10.6–13.4)

## 2011-07-31 LAB — CBC WITH DIFFERENTIAL (CANCER CENTER ONLY)
BASO%: 0.5 % (ref 0.0–2.0)
Eosinophils Absolute: 0.3 10*3/uL (ref 0.0–0.5)
MCH: 31.7 pg (ref 26.0–34.0)
MONO%: 5.8 % (ref 0.0–13.0)
NEUT#: 10.9 10*3/uL — ABNORMAL HIGH (ref 1.5–6.5)
Platelets: 444 10*3/uL — ABNORMAL HIGH (ref 145–400)
RBC: 4.07 10*6/uL (ref 3.70–5.32)
WBC: 15.1 10*3/uL — ABNORMAL HIGH (ref 3.9–10.0)

## 2011-07-31 LAB — IRON AND TIBC: Iron: 50 ug/dL (ref 42–145)

## 2011-07-31 MED ORDER — DOXYCYCLINE HYCLATE 100 MG PO TABS: 100.0000 mg | ORAL_TABLET | Freq: Two times a day (BID) | ORAL | Status: AC

## 2011-07-31 MED ORDER — RIVAROXABAN 20 MG PO TABS: 20.0000 mg | ORAL_TABLET | Freq: Every day | ORAL | Status: DC

## 2011-07-31 NOTE — Progress Notes (Signed)
This office note has been dictated.

## 2011-08-01 NOTE — Progress Notes (Signed)
CC:   Lear Ng, MD V. Annamarie Major IV, MD  DIAGNOSES: 1. Idiopathic aortic thrombosis. 2. Iron deficiency anemia.  CURRENT THERAPY: 1. IV iron as indicated. 2. The patient to be switched to Xarelto 20 mg p.o. daily.  INTERIM HISTORY:  Ms. Baucum comes in for followup.  She is doing well. She has been on anticoagulation now for 2 years.  I think we can see about getting her on to Xarelto.  I think that she would do well with Xarelto.  She has had difficulties with Coumadin with trying to keep levels therapeutic.  She does feel okay.  She is still smoking, but trying to cut back.  I think she is down to 1 pack a day.  Unfortunately, she continues to have a lot of stress at home.  This does seem to be getting a little bit better, however.  She last got iron back in late December.  At that point in time, her iron saturation was 8.  Her total iron was 31.  Her ferritin is somewhat elevated because of being an acute phase reactant.  Again, she got 1020 mg of Feraheme on 06/23/2011.  She has had no bleeding.  She does have the monthly cycles which can be a little bit heavy on occasion.  PHYSICAL EXAMINATION:  General:  This is a well-developed, well- nourished white female in no obvious distress.  Vital Signs: Temperature 97.8, pulse 84, respiratory rate 16, blood pressure 103/61, weight is 221.  Head and Neck Exam:  Shows a normocephalic, atraumatic skull.  There are no ocular or oral lesions.  There are no palpable cervical or supraclavicular lymph nodes.  Lungs:  Clear bilaterally. Cardiac Exam:  Regular rate and rhythm with a normal S1 and S2.  There are no murmurs, rubs, or bruits.  Abdominal Exam:  Soft with good bowel sounds.  There is no palpable abdominal mass.  There is no fluid wave. There is no palpable hepatosplenomegaly.  Back Exam:  No tenderness over the spine, ribs, or hips.  Extremities:  Show no clubbing, cyanosis, or edema.  Neurological Exam:  Shows  no focal neurological deficits.  LABORATORY STUDIES:  Show a white cell count of 15, hemoglobin 12.9, hematocrit 39.1, platelet count 444.  Ferritin is 68 with iron saturation of 16%.  INR is 4.9.  IMPRESSION:  Kari Hahn is a 41 year old white female with an idiopathic aortic thrombus.  She presented back in February 2011.  I think we can try to get her on Xarelto.  I believe that this would be a good idea for her.  I think it would make life a whole lot easier for her.  We have been having to change her Coumadin dose quite often.  It is a real inconvenience for her to come into the office.  I think the question now is how long to keep her on anticoagulation. She has been on anticoagulation now for 2 years.  Possibly, we can think about 1 more year of anticoagulation.  We can then consider putting her on aspirin at a low dose.  We will have Ms. Youkhana come back to see [place] in another couple of months or so.    ______________________________ Volanda Napoleon, M.D. PRE/MEDQ  D:  08/01/2011  T:  08/01/2011  Job:  [id number]

## 2011-08-04 IMAGING — CT CT ANGIO CHEST
2 of 5 series · 19 of 36 positions shown · IV contrast (APPLIED)
Comparison: 12/02/2009

CLINICAL DATA: Aortic thrombosis

CT ANGIOGRAPHY CHEST WITH CONTRAST
TECHNIQUE: Multidetector CT imaging of the chest was performed
using the standard protocol during bolus administration of
intravenous contrast.  Multiplanar CT image reconstructions
including MIPs were obtained to evaluate the vascular anatomy.
Contrast:  100 ml [id number]

[Series 4: cta chest 2.0 b25f · axial · 0.62mm/px · z∈[-248,-34]mm · 18 of 117 slices shown]
[im 5/117  lung]
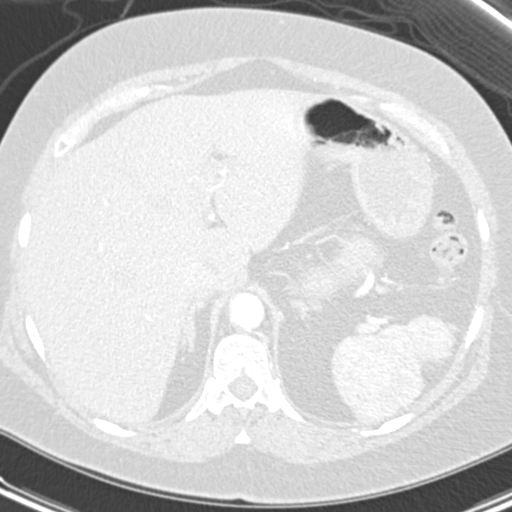
[im 13/117  mediastinal]
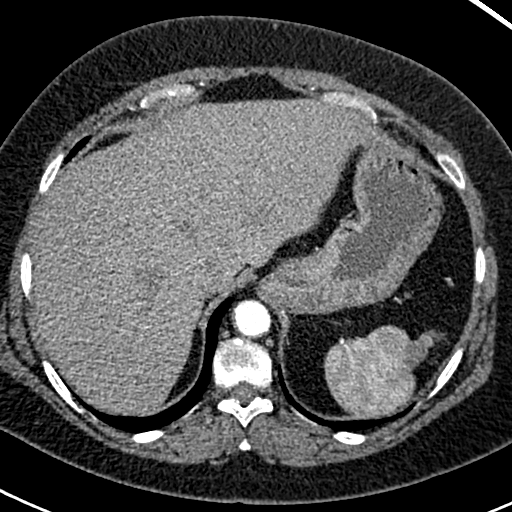
[im 18/117  lung]
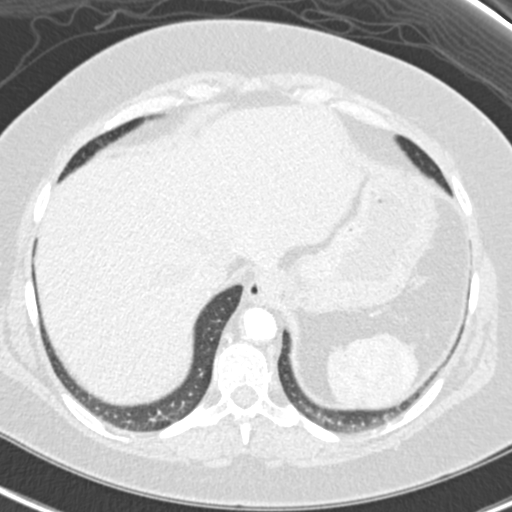
[im 26/117  mediastinal]
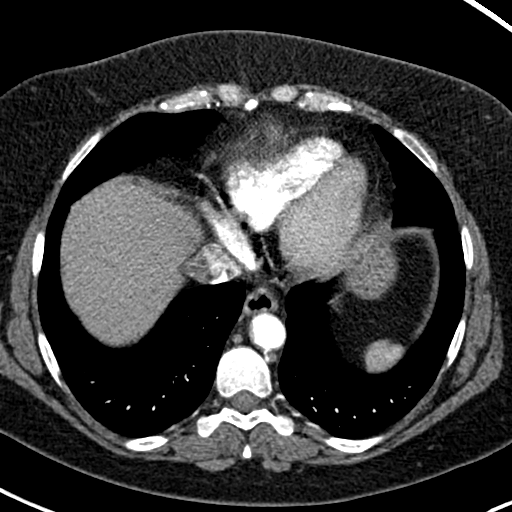
[im 31/117  lung]
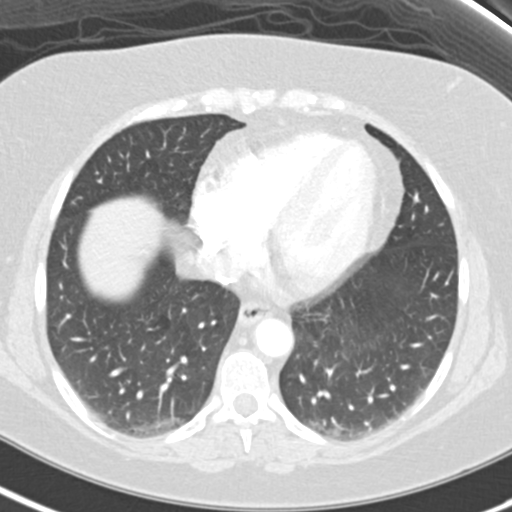
[im 35/117  mediastinal]
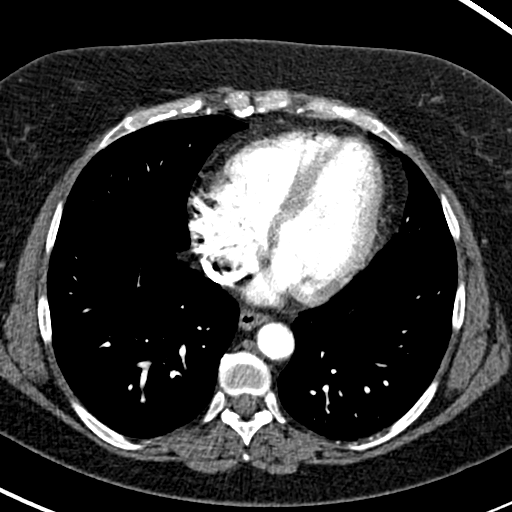
[im 43/117  lung]
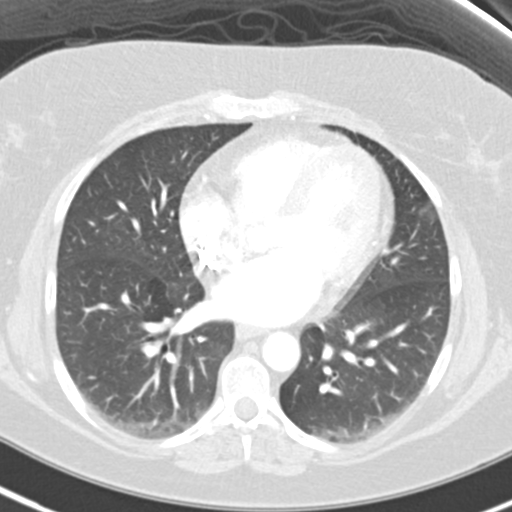
[im 48/117  mediastinal]
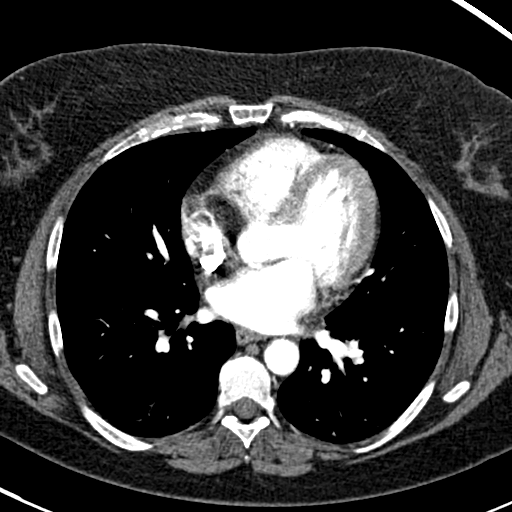
[im 56/117  lung]
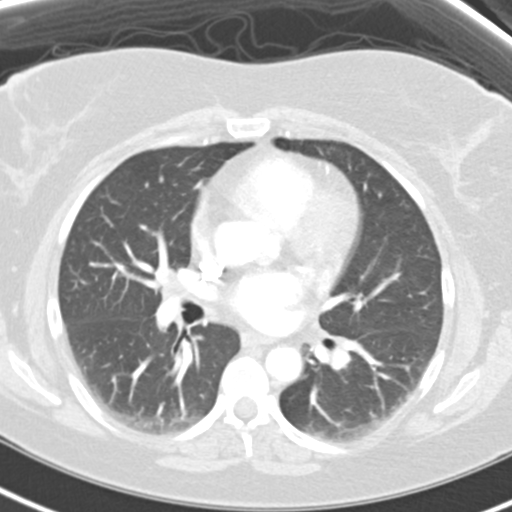
[im 61/117  mediastinal]
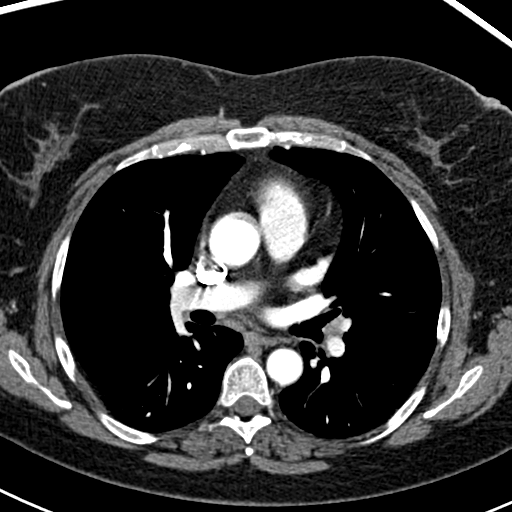
[im 69/117  lung]
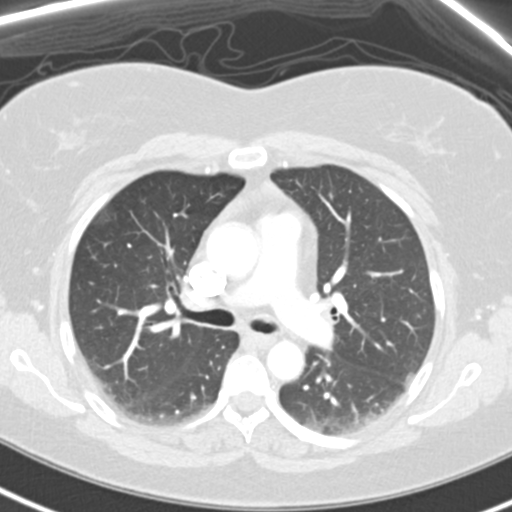
[im 74/117  mediastinal]
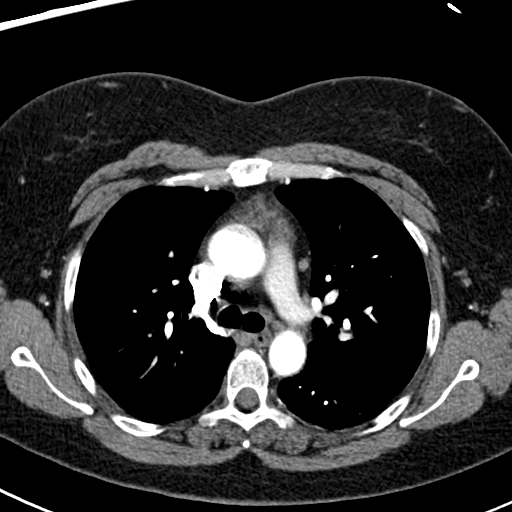
[im 82/117  lung]
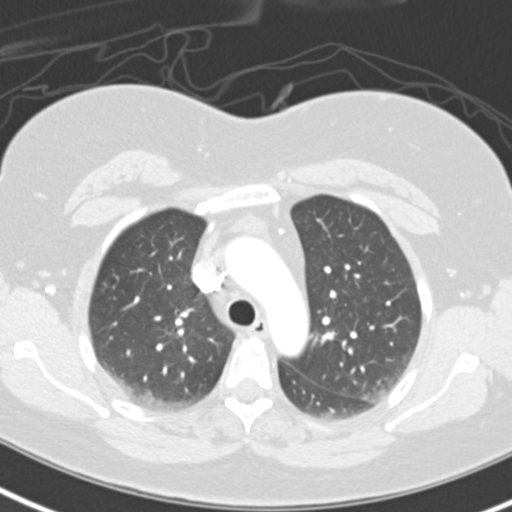
[im 86/117  mediastinal]
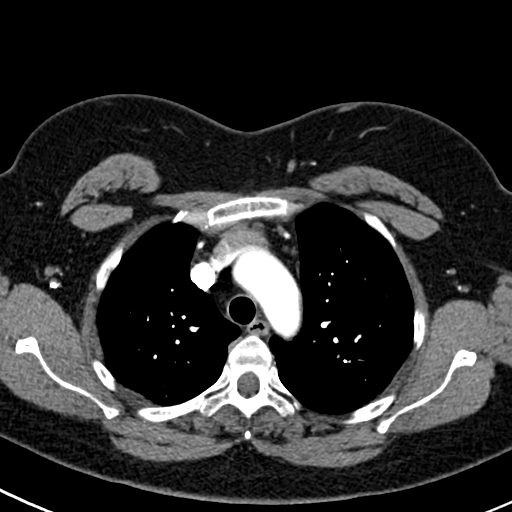
[im 91/117  lung]
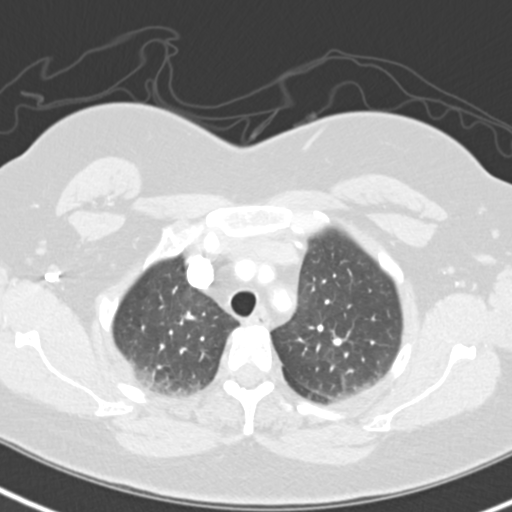
[im 99/117  mediastinal]
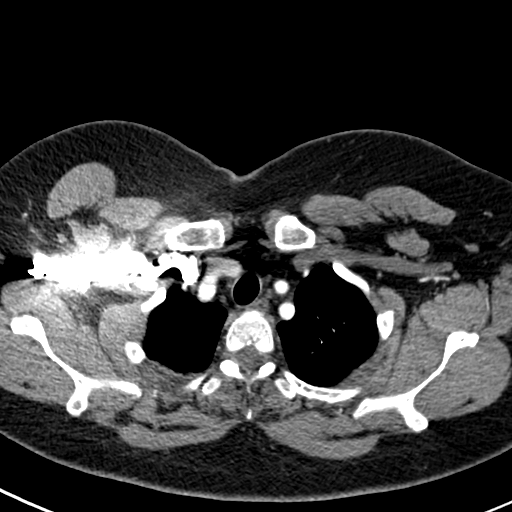
[im 104/117  lung]
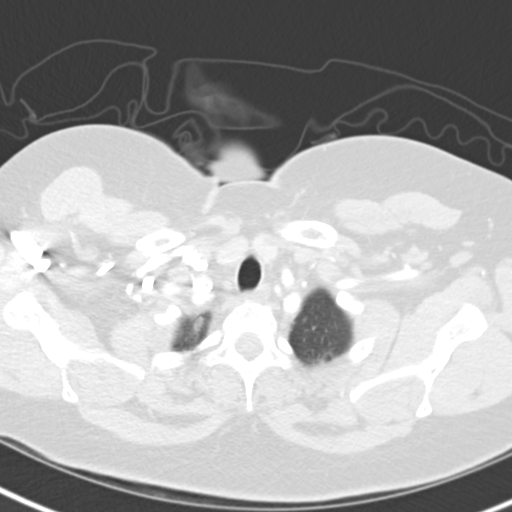
[im 112/117  mediastinal]
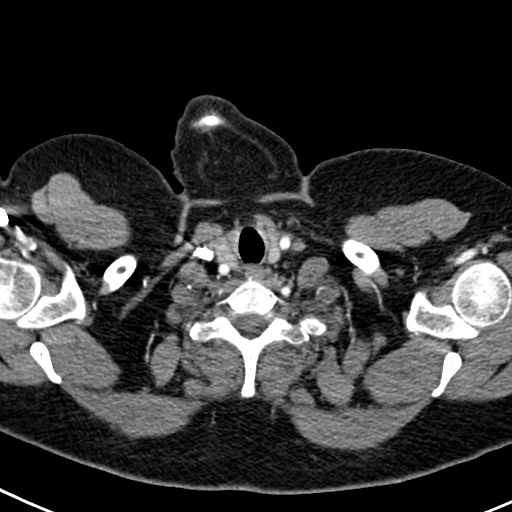

[Series 7: cta chest 2.0 coronal · coronal · 0.45mm/px · 1 of 118 slices shown]
[im 59/118  mediastinal]
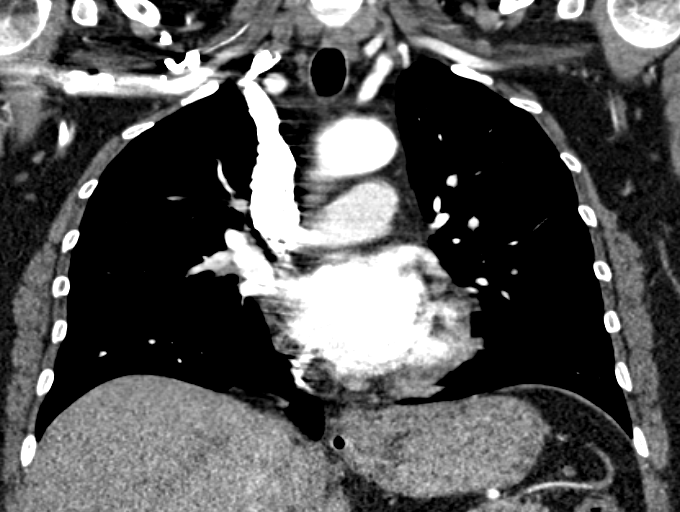

[19 of 36 positions shown; findings below may reference images not displayed]

FINDINGS: Areas of aortic aneurysm or dissection.  No significant
acute thrombus in the aorta.  Minimal atherosclerotic changes of
the distal thoracic aorta.  No obvious evidence of acute pulmonary
thromboembolism.

Abnormal appearance of the spleen is unchanged.

No evidence of abnormal adenopathy.

Dependent atelectasis bilaterally.

Clear lungs.

Review of the MIP images confirms the above findings.
IMPRESSION: No acute cardiopulmonary disease.  No evidence of acute aortic
thrombus.

## 2011-08-07 ENCOUNTER — Telehealth: Payer: Self-pay | Admitting: *Deleted

## 2011-08-07 NOTE — Telephone Encounter (Signed)
Message copied by Anselm Jungling on Mon Aug 07, 2011  2:01 PM ------      Message from: Arlan Organ R      Created: Thu Aug 03, 2011  6:38 PM       Call her and say iron is better!!!  pete

## 2011-08-07 NOTE — Telephone Encounter (Signed)
Called patient to let her know that her iron levels are ok per dr. Myna Hidalgo.

## 2011-08-15 ENCOUNTER — Telehealth: Payer: Self-pay | Admitting: *Deleted

## 2011-08-15 NOTE — Telephone Encounter (Signed)
Received a call from the pt stating that she is have trouble staying awake and wants to know if she needs to come back in to have her labs checked again. She describes it as "waking up then falling asleep again in 2 hours". Denies any new medications except for the changes that Dr Marin Olp made when she was here on the 7th but she stated it started before then but really didn't tell him about it. Reviewed her labs from that day. Dr Marin Olp made a notation that her "iron levels were better". Asked if she had a recent physical with her PCP and she stated "no". "In fact, I am looking for another one because all I see at my current doctor's office is the PA. I usually don't mind but I don't really care for this one". Gave her Dr Hodgin's name and # with [facility]. His office is located close to this one. Explained that she could have another health issue that is causing her drowsiness. She then stated "I see how that could make sense. My iron levels were a lot lower and I didn't have this problem before". To call back if further issues but make Dr Marin Olp aware of her call.

## 2011-09-28 ENCOUNTER — Encounter: Payer: Self-pay | Admitting: Hematology & Oncology

## 2011-09-28 ENCOUNTER — Ambulatory Visit (HOSPITAL_BASED_OUTPATIENT_CLINIC_OR_DEPARTMENT_OTHER): Payer: PRIVATE HEALTH INSURANCE | Admitting: Hematology & Oncology

## 2011-09-28 ENCOUNTER — Other Ambulatory Visit (HOSPITAL_BASED_OUTPATIENT_CLINIC_OR_DEPARTMENT_OTHER): Payer: PRIVATE HEALTH INSURANCE | Admitting: Lab

## 2011-09-28 VITALS — BP 111/64 | HR 101 | Temp 97.2°F | Ht 62.0 in | Wt 225.0 lb

## 2011-09-28 DIAGNOSIS — I749 Embolism and thrombosis of unspecified artery: Secondary | ICD-10-CM | POA: Insufficient documentation

## 2011-09-28 DIAGNOSIS — I7411 Embolism and thrombosis of thoracic aorta: Secondary | ICD-10-CM

## 2011-09-28 DIAGNOSIS — D509 Iron deficiency anemia, unspecified: Secondary | ICD-10-CM

## 2011-09-28 DIAGNOSIS — I7410 Embolism and thrombosis of unspecified parts of aorta: Secondary | ICD-10-CM

## 2011-09-28 HISTORY — DX: Iron deficiency anemia, unspecified: D50.9

## 2011-09-28 LAB — CBC WITH DIFFERENTIAL (CANCER CENTER ONLY)
BASO#: 0.1 10*3/uL (ref 0.0–0.2)
EOS%: 2.5 % (ref 0.0–7.0)
Eosinophils Absolute: 0.4 10*3/uL (ref 0.0–0.5)
HCT: 36.2 % (ref 34.8–46.6)
HGB: 11.9 g/dL (ref 11.6–15.9)
MCH: 31.1 pg (ref 26.0–34.0)
MCHC: 32.9 g/dL (ref 32.0–36.0)
MONO%: 7.4 % (ref 0.0–13.0)
NEUT%: 73.4 % (ref 39.6–80.0)

## 2011-09-28 NOTE — Progress Notes (Signed)
This office note has been dictated.

## 2011-09-29 LAB — IRON AND TIBC
%SAT: 7 % — ABNORMAL LOW (ref 20–55)
Iron: 30 ug/dL — ABNORMAL LOW (ref 42–145)
TIBC: 431 ug/dL (ref 250–470)

## 2011-09-29 NOTE — Progress Notes (Signed)
CC:   Kari Ng, MD V. Kari Hahn IV, MD  DIAGNOSIS: 1. Aortic thrombosis idiopathic. 2. Recurrent iron deficiency anemia.  CURRENT THERAPY: 1. Xarelto 20 mg p.o. daily. 2. IV iron as indicated.  INTERVAL HISTORY:  Kari Hahn comes in for her followup.  She is on Xarelto now.  She says that neurology does cause her to have some nausea.  I did go ahead and give her some samples of Zuplenz. Hopefully this will help her.  She last got iron back in December.  She had Feraheme 1020 mg.  When we last saw her, her ferritin was 68 and her saturation was 60%. The patient still is very tired.  I suppose that she is probably iron deficient again.  She has had no obvious bleeding.  She has had no abdominal pain.  There has been no leg swelling.  She is trying to cut back on her smoking.  PHYSICAL EXAMINATION:  This is a well-developed, well-nourished white female in no obvious distress.  Vital signs:  Temperature 97,2, pulse 101, respiratory rate 18, blood pressure 111/64.  Weight is 225.  Head and neck:  Exam shows a normocephalic, atraumatic skull.  There are no ocular or oral lesions.  There are no palpable cervical or supraclavicular lymph nodes.  Lungs:  Clear to percussion and auscultation bilaterally.  Cardiac:  Regular rate and rhythm with normal S1, S2.  There are no murmurs, rubs or bruits.  Abdomen:  Soft, mildly obese.  She has good bowel sounds.  There is no guarding or rebound tenderness.  There is no palpable hepatosplenomegaly.  Back:  No tenderness over the spine, ribs, or hips.  Extremities:  No clubbing, cyanosis or edema.  She has good pulses in distal extremities.  LABORATORY STUDIES:  White cell count is 14, hemoglobin 12, hematocrit 36.2, platelet count is 532. MCV is 95.  IMPRESSION:  Kari Hahn is a 41 year old white female with an idiopathic aortic thrombosis.  She presented back in February of 2011.  I believe that we will keep her on  anticoagulation  for 3 years total. As such, she will complete Xarelto in, I think, January 2014.  I think she is still tired because she is iron deficient again.  She is not bleeding.  She just does not absorb iron.  We will have to get her set up with IV iron again.  Will plan to get her back to see [place] in another 6 weeks or so and hopefully she may be feeling a little better.  I tried to talk to her about the smoking cessation.  She is doing her best with this.    ______________________________ Volanda Napoleon, M.D. PRE/MEDQ  D:  09/28/2011  T:  09/29/2011  Job:  [id number]

## 2011-10-02 ENCOUNTER — Telehealth: Payer: Self-pay | Admitting: *Deleted

## 2011-10-02 ENCOUNTER — Other Ambulatory Visit: Payer: Self-pay | Admitting: *Deleted

## 2011-10-02 DIAGNOSIS — R112 Nausea with vomiting, unspecified: Secondary | ICD-10-CM

## 2011-10-02 MED ORDER — ONDANSETRON 4 MG PO FILM: 1.0000 | ORAL_FILM | Freq: Every day | ORAL | Status: DC | PRN

## 2011-10-02 NOTE — Telephone Encounter (Addendum)
Message copied by Orlando Penner on Mon Oct 02, 2011  4:15 PM ------      Message from: Burney Gauze R      Created: Sun Oct 01, 2011  1:49 PM       Iron is very low again!!!  Need FeraHeme 105m in 1-2 wks.  Pete Pt given this message.

## 2011-10-02 NOTE — Telephone Encounter (Addendum)
Message copied by Jodelle Green on Mon Oct 02, 2011  2:04 PM ------      Message from: Burney Gauze R      Created: Thu Sep 28, 2011  7:06 PM       Kari Hahn is iron is very low again. She will need an other dose of Feraheme at 1020 mg. Please let her know this and set this up in one or 2 weeks.            Pete  10-02-11: Spoke to pt regarding the above message. Will have scheduler call her with an appt. She requested a rx for Zuplenz. The samples given to her by Dr Marin Olp helped with the nausea associated with Xarelto. Asked if she has tried oral Zofran and she stated that "it doesn't work and the [place] makes me too drowsy". Will send rx via [facility] but cautioned the pt that her insurance may require pre-authorization. In the meantime, will provide her with samples.

## 2011-10-02 NOTE — Telephone Encounter (Signed)
Pt aware of 4-12 iron infusion appointment

## 2011-10-06 ENCOUNTER — Ambulatory Visit (HOSPITAL_BASED_OUTPATIENT_CLINIC_OR_DEPARTMENT_OTHER): Payer: PRIVATE HEALTH INSURANCE

## 2011-10-06 VITALS — BP 113/64 | HR 90 | Temp 97.5°F

## 2011-10-06 DIAGNOSIS — D509 Iron deficiency anemia, unspecified: Secondary | ICD-10-CM

## 2011-10-06 MED ORDER — SODIUM CHLORIDE 0.9 % IV SOLN
1020.0000 mg | Freq: Once | INTRAVENOUS | Status: AC
  Administered 2011-10-06: 1020 mg via INTRAVENOUS
  Filled 2011-10-06: qty 34

## 2011-10-06 MED ORDER — SODIUM CHLORIDE 0.9 % IV SOLN: Freq: Once | INTRAVENOUS | Status: DC

## 2011-11-23 ENCOUNTER — Other Ambulatory Visit (HOSPITAL_BASED_OUTPATIENT_CLINIC_OR_DEPARTMENT_OTHER): Payer: PRIVATE HEALTH INSURANCE | Admitting: Lab

## 2011-11-23 ENCOUNTER — Telehealth: Payer: Self-pay | Admitting: Hematology & Oncology

## 2011-11-23 ENCOUNTER — Ambulatory Visit (HOSPITAL_BASED_OUTPATIENT_CLINIC_OR_DEPARTMENT_OTHER): Payer: PRIVATE HEALTH INSURANCE | Admitting: Hematology & Oncology

## 2011-11-23 VITALS — BP 120/66 | HR 102 | Temp 97.2°F | Ht 62.0 in | Wt 237.0 lb

## 2011-11-23 DIAGNOSIS — D509 Iron deficiency anemia, unspecified: Secondary | ICD-10-CM

## 2011-11-23 DIAGNOSIS — I7410 Embolism and thrombosis of unspecified parts of aorta: Secondary | ICD-10-CM

## 2011-11-23 DIAGNOSIS — I7411 Embolism and thrombosis of thoracic aorta: Secondary | ICD-10-CM

## 2011-11-23 DIAGNOSIS — L738 Other specified follicular disorders: Secondary | ICD-10-CM

## 2011-11-23 DIAGNOSIS — R350 Frequency of micturition: Secondary | ICD-10-CM

## 2011-11-23 DIAGNOSIS — R609 Edema, unspecified: Secondary | ICD-10-CM

## 2011-11-23 LAB — CBC WITH DIFFERENTIAL (CANCER CENTER ONLY)
BASO#: 0.1 10*3/uL (ref 0.0–0.2)
BASO%: 0.6 % (ref 0.0–2.0)
EOS%: 2.6 % (ref 0.0–7.0)
HGB: 12.4 g/dL (ref 11.6–15.9)
LYMPH#: 2.1 10*3/uL (ref 0.9–3.3)
MCHC: 33.2 g/dL (ref 32.0–36.0)
MONO%: 5.3 % (ref 0.0–13.0)
NEUT#: 8.8 10*3/uL — ABNORMAL HIGH (ref 1.5–6.5)
Platelets: 444 10*3/uL — ABNORMAL HIGH (ref 145–400)
RDW: 17 % — ABNORMAL HIGH (ref 11.1–15.7)

## 2011-11-23 MED ORDER — DOXYCYCLINE HYCLATE 100 MG PO TABS: 100.0000 mg | ORAL_TABLET | Freq: Two times a day (BID) | ORAL | Status: AC

## 2011-11-23 MED ORDER — TRIAMTERENE-HCTZ 37.5-25 MG PO TABS: 1.0000 | ORAL_TABLET | Freq: Every day | ORAL | Status: DC

## 2011-11-23 NOTE — Progress Notes (Signed)
This office note has been dictated.

## 2011-11-23 NOTE — Telephone Encounter (Signed)
Patient aware of 5-31 CT downstairs at 11am and to be NPO 4 hrs. She is also aware of 12-01-11 appointment with Dr. Dion Saucier at 11 am.

## 2011-11-24 ENCOUNTER — Ambulatory Visit (HOSPITAL_BASED_OUTPATIENT_CLINIC_OR_DEPARTMENT_OTHER)
Admission: RE | Admit: 2011-11-24 | Discharge: 2011-11-24 | Disposition: A | Discharge status: Home / Self Care | Payer: PRIVATE HEALTH INSURANCE | Source: Ambulatory Visit | Attending: Hematology & Oncology | Admitting: Hematology & Oncology

## 2011-11-24 DIAGNOSIS — I7411 Embolism and thrombosis of thoracic aorta: Secondary | ICD-10-CM | POA: Insufficient documentation

## 2011-11-24 DIAGNOSIS — I7410 Embolism and thrombosis of unspecified parts of aorta: Secondary | ICD-10-CM

## 2011-11-24 DIAGNOSIS — R609 Edema, unspecified: Secondary | ICD-10-CM | POA: Insufficient documentation

## 2011-11-24 DIAGNOSIS — Z7901 Long term (current) use of anticoagulants: Secondary | ICD-10-CM | POA: Insufficient documentation

## 2011-11-24 DIAGNOSIS — K8020 Calculus of gallbladder without cholecystitis without obstruction: Secondary | ICD-10-CM | POA: Insufficient documentation

## 2011-11-24 LAB — FERRITIN: Ferritin: 50 ng/mL (ref 10–291)

## 2011-11-24 LAB — IRON AND TIBC
Iron: 69 ug/dL (ref 42–145)
UIBC: 269 ug/dL (ref 125–400)

## 2011-11-24 MED ORDER — IOHEXOL 350 MG/ML SOLN
100.0000 mL | Freq: Once | INTRAVENOUS | Status: AC | PRN
  Administered 2011-11-24: 100 mL via INTRAVENOUS

## 2011-11-24 NOTE — Progress Notes (Signed)
DIAGNOSES: 1. Idiopathic aortic thrombosis. 2. Recurrent iron deficiency anemia.  CURRENT THERAPY: 1. Xarelto 20 mg p.o. daily. 2. IV iron as indicated.  INTERIM HISTORY:  [name] comes in for followup.  Her problem now is that she has a lot of swelling in her legs.  This started on Saturday. She said that she also has had some swelling in her hands.  There has been no change in medications.  She has had no fever.  She has had no change in bowel or bladder habits.  She says she does urinate relatively frequently.  She has gained 12 pounds since we last saw her.  We last gave her iron back in April.  At that point in time, her ferritin was down at 9.  Back in April, her D-dimer was 0.23.  She has had no obvious bleeding.  She has occasional headaches.  She has had no nausea.  She has not had any rashes.  She does have problems with folliculitis. I will go ahead and give her some doxycycline for this.  She is still smoking.  PHYSICAL EXAM:  This is a somewhat obese white female in no obvious distress.  Vital signs:  Temperature 97.2, pulse 102, respiratory rate 18, blood pressure 120/66, weight is 237.  Head/Neck:  A normocephalic, atraumatic skull.  There are no ocular or oral lesions.  There are no palpable cervical or supraclavicular lymph nodes.  Lungs:  Clear bilaterally.  She has no rales, wheezes or rhonchi.  Cardiac:  Regular rate and rhythm with normal S1 and S2.  There are no murmurs, rubs or bruits.  Abdomen:  Soft, mildly obese.  She has no fluid wave.  There is no palpable abdominal mass.  There is no palpable hepatosplenomegaly. Back:  No tenderness over the spine, ribs, or hips.  Extremities:  2+ edema in her legs bilaterally.  No venous cord is noted in her legs. She has 1+ edema in her hands bilaterally.  Skin:  Exam does show some folliculitis.  LABORATORY STUDIES:  White cell count is 12, hemoglobin 12.4, hematocrit 37.3, platelet count 444.  MCV is  98.  IMPRESSION:  Kari Hahn is a 41 year old white female with an idiopathic aortic thrombosis.  She presented with this back in February of 2011. She is on anticoagulation.  I want to try to keep her on anticoagulation until January of 2014.  I am not sure why she is having this edema.  She, of course, does not have a family doctor.  I will have to see if Dr. Dion Saucier or Dr. Coralyn Mark can see her downstairs.  She really does need to have a family doctor for her over issues.  I think we will have to get another CT of her abdomen.  I want to make sure that nothing is going on from a thrombotic point of view.  Will have to get her back in another month so we can see how she is doing.  This certainly was unexpected.  I spent over half an hour with her today.    ______________________________ Volanda Napoleon, M.D. PRE/MEDQ  D:  11/23/2011  T:  11/24/2011  Job:  [id number]

## 2011-11-28 ENCOUNTER — Encounter: Payer: Self-pay | Admitting: *Deleted

## 2011-11-28 ENCOUNTER — Telehealth: Payer: Self-pay | Admitting: *Deleted

## 2011-11-28 NOTE — Telephone Encounter (Addendum)
Message copied by Orlando Penner on Tue Nov 28, 2011  4:32 PM ------      Message from: Volanda Napoleon      Created: Tue Nov 28, 2011  9:11 AM       Call- the blood clot in the aorta is better!!!  Nothing new that I can see to cause leg swelling.  Pete Pt called and given this message.  Voiced understanding.

## 2011-11-28 NOTE — Progress Notes (Signed)
Opened in error

## 2011-12-01 ENCOUNTER — Other Ambulatory Visit: Payer: Self-pay | Admitting: *Deleted

## 2011-12-01 NOTE — Telephone Encounter (Signed)
error 

## 2011-12-08 ENCOUNTER — Other Ambulatory Visit (HOSPITAL_BASED_OUTPATIENT_CLINIC_OR_DEPARTMENT_OTHER): Payer: PRIVATE HEALTH INSURANCE | Admitting: Lab

## 2011-12-08 ENCOUNTER — Ambulatory Visit (HOSPITAL_BASED_OUTPATIENT_CLINIC_OR_DEPARTMENT_OTHER): Payer: PRIVATE HEALTH INSURANCE | Admitting: Hematology & Oncology

## 2011-12-08 VITALS — BP 111/69 | HR 95 | Temp 97.2°F | Ht 62.0 in | Wt 235.0 lb

## 2011-12-08 DIAGNOSIS — D509 Iron deficiency anemia, unspecified: Secondary | ICD-10-CM

## 2011-12-08 DIAGNOSIS — I7411 Embolism and thrombosis of thoracic aorta: Secondary | ICD-10-CM

## 2011-12-08 DIAGNOSIS — I7410 Embolism and thrombosis of unspecified parts of aorta: Secondary | ICD-10-CM

## 2011-12-08 LAB — CMP (CANCER CENTER ONLY)
ALT(SGPT): 29 U/L (ref 10–47)
AST: 30 U/L (ref 11–38)
Albumin: 3.2 g/dL — ABNORMAL LOW (ref 3.3–5.5)
Alkaline Phosphatase: 69 U/L (ref 26–84)
Potassium: 4.3 mEq/L (ref 3.3–4.7)
Sodium: 141 mEq/L (ref 128–145)
Total Bilirubin: 0.4 mg/dl (ref 0.20–1.60)
Total Protein: 7.5 g/dL (ref 6.4–8.1)

## 2011-12-08 LAB — IRON AND TIBC
%SAT: 18 % — ABNORMAL LOW (ref 20–55)
TIBC: 331 ug/dL (ref 250–470)
UIBC: 273 ug/dL (ref 125–400)

## 2011-12-08 LAB — PROTIME-INR (CHCC SATELLITE): INR: 1.1 — ABNORMAL LOW (ref 2.0–3.5)

## 2011-12-08 LAB — FERRITIN: Ferritin: 30 ng/mL (ref 10–291)

## 2011-12-08 LAB — CBC WITH DIFFERENTIAL (CANCER CENTER ONLY)
BASO#: 0.1 10*3/uL (ref 0.0–0.2)
Eosinophils Absolute: 0.3 10*3/uL (ref 0.0–0.5)
HGB: 11.3 g/dL — ABNORMAL LOW (ref 11.6–15.9)
MCH: 32.7 pg (ref 26.0–34.0)
MONO%: 5.3 % (ref 0.0–13.0)
NEUT#: 10.1 10*3/uL — ABNORMAL HIGH (ref 1.5–6.5)
RBC: 3.46 10*6/uL — ABNORMAL LOW (ref 3.70–5.32)

## 2011-12-08 NOTE — Progress Notes (Signed)
This office note has been dictated.

## 2011-12-08 NOTE — Progress Notes (Signed)
DIAGNOSES: 1. Idiopathic aortic thrombosis. 2. Recurrent iron deficiency anemia.  CURRENT THERAPY: 1. Xarelto 20 mg p.o. daily. 2. IV iron as indicated.  INTERIM HISTORY:  Ms. Kunz comes in for followup.  She is doing okay. She is on some diuretic.  This has helped with some of the swelling in her legs.  She has lost a couple pounds since we last saw her.  We did go ahead and repeat her CT angiogram.  I was worried about another thromboembolic event in her abdomen.  Thankfully, the CT did not show any evidence of thrombus.  She had some residual mural thickening in the aorta.  All other vessels were clear.  She is considering gastric bypass.  I think this is actually a good idea for her.  When we last saw her, her ferritin was 50 and iron saturation was 20%.  PHYSICAL EXAMINATION:  General:  This is an obese white female in no obvious distress.  Vital signs:  Show temperature of 97, pulse 95, respiratory rate 18, blood pressure 111/69.  Weight is 235.  Head and neck:  Exam shows a normocephalic, atraumatic skull.  There are no ocular or oral lesions.  There are no palpable cervical or supraclavicular lymph nodes.  Lungs:  Clear bilaterally.  Cardiac: Regular rate and rhythm with a normal S1 and S2.  There are no murmurs, rubs or bruits.  Abdomen:  Soft with good bowel sounds.  There is no palpable abdominal mass.  She is somewhat obese.  She has no palpable hepatosplenomegaly.  Back:  No tenderness over the spine, ribs or hips. Extremities:  Shows some trace edema in her legs.  LABORATORY STUDIES:  White cell count is 13, hemoglobin 11.3, hematocrit 34, platelet count of 528.  MCV is 98.  IMPRESSION:  Ms. Roehrs is a 41 year old white female with an idiopathic aortic thrombosis.  She was diagnosed back in January of 2011.  She is on anticoagulation for this.  I still want to keep her on anticoagulation until January 2014.  I do not have any problems with her undergoing  gastric bypass.  I think that she has been on anticoagulation long enough that there should be little risk with respect to postop complications from a thrombotic point of view.  We will have to see what her iron studies are.  Her platelet count does tend to go up when her iron is low.  We will plan to get her back in another couple of months for followup.  ADDENDUM:  I forgot to mention that she is going to be seeing gastroenterology after she gets back from her vacation.  Hopefully they will be able to scope her.    ______________________________ Volanda Napoleon, M.D. PRE/MEDQ  D:  12/08/2011  T:  12/08/2011  Job:  [id number]

## 2011-12-11 ENCOUNTER — Telehealth: Payer: Self-pay | Admitting: Hematology & Oncology

## 2011-12-11 NOTE — Telephone Encounter (Signed)
Mailed 8-16 appointment to patient

## 2011-12-21 ENCOUNTER — Other Ambulatory Visit: Payer: Self-pay | Admitting: Gastroenterology

## 2011-12-21 DIAGNOSIS — R109 Unspecified abdominal pain: Secondary | ICD-10-CM

## 2011-12-22 ENCOUNTER — Ambulatory Visit
Admission: RE | Admit: 2011-12-22 | Discharge: 2011-12-22 | Disposition: A | Discharge status: Home / Self Care | Payer: PRIVATE HEALTH INSURANCE | Source: Ambulatory Visit | Attending: Gastroenterology | Admitting: Gastroenterology

## 2011-12-22 DIAGNOSIS — R109 Unspecified abdominal pain: Secondary | ICD-10-CM

## 2012-01-01 ENCOUNTER — Telehealth: Payer: Self-pay | Admitting: *Deleted

## 2012-01-01 ENCOUNTER — Other Ambulatory Visit: Payer: Self-pay | Admitting: *Deleted

## 2012-01-01 DIAGNOSIS — D509 Iron deficiency anemia, unspecified: Secondary | ICD-10-CM

## 2012-01-01 NOTE — Telephone Encounter (Signed)
Called patient and left message that her iron is low per dr. Myna Hidalgo. Needs a dose of Feraheme 1020 mg x 1 .  Asked patient to call our office to schedule.

## 2012-01-01 NOTE — Telephone Encounter (Signed)
Message copied by Anselm Jungling on Mon Jan 01, 2012 11:04 AM ------      Message from: Josph Macho      Created: Sun Dec 10, 2011  8:58 PM       Call - iron is low again. Need feraheme 1020mg  x 1 dose. Please set up.  pete

## 2012-01-04 ENCOUNTER — Ambulatory Visit (HOSPITAL_BASED_OUTPATIENT_CLINIC_OR_DEPARTMENT_OTHER): Payer: PRIVATE HEALTH INSURANCE

## 2012-01-04 VITALS — BP 122/71 | HR 97 | Temp 97.2°F

## 2012-01-04 DIAGNOSIS — D509 Iron deficiency anemia, unspecified: Secondary | ICD-10-CM

## 2012-01-04 MED ORDER — HEPARIN SOD (PORK) LOCK FLUSH 100 UNIT/ML IV SOLN
500.0000 [IU] | Freq: Once | INTRAVENOUS | Status: DC | PRN
  Filled 2012-01-04: qty 5

## 2012-01-04 MED ORDER — SODIUM CHLORIDE 0.9 % IV SOLN
INTRAVENOUS | Status: DC
  Administered 2012-01-04: 14:00:00 via INTRAVENOUS

## 2012-01-04 MED ORDER — SODIUM CHLORIDE 0.9 % IJ SOLN
10.0000 mL | INTRAMUSCULAR | Status: DC | PRN
  Filled 2012-01-04: qty 10

## 2012-01-04 MED ORDER — HEPARIN SOD (PORK) LOCK FLUSH 100 UNIT/ML IV SOLN
250.0000 [IU] | Freq: Once | INTRAVENOUS | Status: DC | PRN
  Filled 2012-01-04: qty 5

## 2012-01-04 MED ORDER — SODIUM CHLORIDE 0.9 % IJ SOLN
3.0000 mL | Freq: Once | INTRAMUSCULAR | Status: DC | PRN
  Filled 2012-01-04: qty 10

## 2012-01-04 MED ORDER — ALTEPLASE 2 MG IJ SOLR
2.0000 mg | Freq: Once | INTRAMUSCULAR | Status: DC | PRN
  Filled 2012-01-04: qty 2

## 2012-01-04 MED ORDER — SODIUM CHLORIDE 0.9 % IV SOLN
1020.0000 mg | Freq: Once | INTRAVENOUS | Status: AC
  Administered 2012-01-04: 1020 mg via INTRAVENOUS
  Filled 2012-01-04: qty 34

## 2012-01-04 NOTE — Patient Instructions (Signed)
Ferumoxytol injection What is this medicine? FERUMOXYTOL is an iron complex. Iron is used to make healthy red blood cells, which carry oxygen and nutrients throughout the body. This medicine is used to treat iron deficiency anemia in people with chronic kidney disease. This medicine may be used for other purposes; ask your health care provider or pharmacist if you have questions. What should I tell my health care provider before I take this medicine? They need to know if you have any of these conditions: -anemia not caused by low iron levels -high levels of iron in the blood -magnetic resonance imaging (MRI) test scheduled -an unusual or allergic reaction to iron, other medicines, foods, dyes, or preservatives -pregnant or trying to get pregnant -breast-feeding How should I use this medicine? This medicine is for infusion into a vein. It is given by a health care professional in a hospital or clinic setting. Talk to your pediatrician regarding the use of this medicine in children. Special care may be needed. Overdosage: If you think you've taken too much of this medicine contact a poison control center or emergency room at once. Overdosage: If you think you have taken too much of this medicine contact a poison control center or emergency room at once. NOTE: This medicine is only for you. Do not share this medicine with others. What if I miss a dose? It is important not to miss your dose. Call your doctor or health care professional if you are unable to keep an appointment. What may interact with this medicine? This medicine may interact with the following medications: -other iron products This list may not describe all possible interactions. Give your health care provider a list of all the medicines, herbs, non-prescription drugs, or dietary supplements you use. Also tell them if you smoke, drink alcohol, or use illegal drugs. Some items may interact with your medicine. What should I watch  for while using this medicine? Visit your doctor or healthcare professional regularly. Tell your doctor or healthcare professional if your symptoms do not start to get better or if they get worse. You may need blood work done while you are taking this medicine. You may need to follow a special diet. Talk to your doctor. Foods that contain iron include: whole grains/cereals, dried fruits, beans, or peas, leafy green vegetables, and organ meats (liver, kidney). What side effects may I notice from receiving this medicine? Side effects that you should report to your doctor or health care professional as soon as possible: -allergic reactions like skin rash, itching or hives, swelling of the face, lips, or tongue -breathing problems -changes in blood pressure -feeling faint or lightheaded, falls -fever or chills -flushing, sweating, or hot feelings -swelling of the ankles or feet Side effects that usually do not require medical attention (Report these to your doctor or health care professional if they continue or are bothersome.): -diarrhea -headache -nausea, vomiting -stomach pain This list may not describe all possible side effects. Call your doctor for medical advice about side effects. You may report side effects to FDA at 1-800-FDA-1088. Where should I keep my medicine? This drug is given in a hospital or clinic and will not be stored at home. NOTE: This sheet is a summary. It may not cover all possible information. If you have questions about this medicine, talk to your doctor, pharmacist, or health care provider.  2012, [employer]. (03/04/2008 9:48:25 PM) 

## 2012-01-30 ENCOUNTER — Other Ambulatory Visit: Payer: Self-pay | Admitting: *Deleted

## 2012-01-30 DIAGNOSIS — I7410 Embolism and thrombosis of unspecified parts of aorta: Secondary | ICD-10-CM

## 2012-01-30 DIAGNOSIS — R109 Unspecified abdominal pain: Secondary | ICD-10-CM

## 2012-01-30 NOTE — Progress Notes (Signed)
Pt called with c/o pain across the top of her abdomen and in her legs. Is currently on Xarelto for previous aortic thrombus. Was treated a few weeks ago for c.diff by Dr. Bosie Clos with Flagyl but had to stop that after 12 days due to abd pain and nausea. When she called his office to report the abd pain, he advised her to call Dr Myna Hidalgo or her vascular surgeon. Reviewed with Dr Myna Hidalgo. To have a CT Angio of the abd to make sure she doesn't have another clot. Pt made aware of the plan and knows she can go to the ER in the meantime if need be.

## 2012-01-31 ENCOUNTER — Ambulatory Visit (HOSPITAL_BASED_OUTPATIENT_CLINIC_OR_DEPARTMENT_OTHER)
Admission: RE | Admit: 2012-01-31 | Discharge: 2012-01-31 | Disposition: A | Discharge status: Home / Self Care | Payer: PRIVATE HEALTH INSURANCE | Source: Ambulatory Visit | Attending: Hematology & Oncology | Admitting: Hematology & Oncology

## 2012-01-31 ENCOUNTER — Telehealth: Payer: Self-pay | Admitting: *Deleted

## 2012-01-31 DIAGNOSIS — I700 Atherosclerosis of aorta: Secondary | ICD-10-CM | POA: Insufficient documentation

## 2012-01-31 DIAGNOSIS — I7410 Embolism and thrombosis of unspecified parts of aorta: Secondary | ICD-10-CM

## 2012-01-31 DIAGNOSIS — R109 Unspecified abdominal pain: Secondary | ICD-10-CM | POA: Insufficient documentation

## 2012-01-31 DIAGNOSIS — Z86718 Personal history of other venous thrombosis and embolism: Secondary | ICD-10-CM | POA: Insufficient documentation

## 2012-01-31 DIAGNOSIS — K8020 Calculus of gallbladder without cholecystitis without obstruction: Secondary | ICD-10-CM | POA: Insufficient documentation

## 2012-01-31 MED ORDER — IOHEXOL 350 MG/ML SOLN
80.0000 mL | Freq: Once | INTRAVENOUS | Status: AC | PRN
  Administered 2012-01-31: 82 mL via INTRAVENOUS

## 2012-01-31 NOTE — Telephone Encounter (Signed)
CT angio results shown to Eunice Blase, [place] in Dr Gustavo Lah absence. She recommends that the pt follow-up with Dr Bosie Clos (GI) as the report shows "cholelithiasis with gallbladder wall prominence and adjacent inflammatory/edematous changes suggesting acute cholecystitis". Faxed report to him at [phone] marked "urgent". Followed up with a called to [facility] who stated she would show it to him in the morning as he was not in the office today. Asked if it could be reviewed with the on call doctor as the pt is having acute symptoms and she again stated it would be shown to Dr Bosie Clos in the am. He would then make a decision as to what interventions are needed. Informed her that we will call the pt with the results and she stated to ask the pt to call their office if she hasn't heard from them by Friday.  Explained to Truth that her gallbladder was inflamed and she did not have any evidence of recurring clot in her abdomen. Told her the report has been faxed to Dr Marge Duncans office followed by a phone call. Asked her to stay away from fatty & spicy foods. To eat a bland diet for now. She verbalized understanding and knows to call Dr Marge Duncans office or go to the ER if her sx worsen.

## 2012-02-02 ENCOUNTER — Other Ambulatory Visit (HOSPITAL_BASED_OUTPATIENT_CLINIC_OR_DEPARTMENT_OTHER): Payer: PRIVATE HEALTH INSURANCE

## 2012-02-09 ENCOUNTER — Ambulatory Visit (HOSPITAL_BASED_OUTPATIENT_CLINIC_OR_DEPARTMENT_OTHER): Payer: Medicaid Other

## 2012-02-09 ENCOUNTER — Encounter: Payer: Self-pay | Admitting: Hematology & Oncology

## 2012-02-09 ENCOUNTER — Other Ambulatory Visit (HOSPITAL_BASED_OUTPATIENT_CLINIC_OR_DEPARTMENT_OTHER): Payer: Medicaid Other | Admitting: Lab

## 2012-02-09 ENCOUNTER — Ambulatory Visit (HOSPITAL_BASED_OUTPATIENT_CLINIC_OR_DEPARTMENT_OTHER): Payer: PRIVATE HEALTH INSURANCE | Admitting: Hematology & Oncology

## 2012-02-09 VITALS — BP 110/60 | HR 70 | Temp 97.3°F | Resp 22 | Ht 62.0 in | Wt 227.0 lb

## 2012-02-09 DIAGNOSIS — D509 Iron deficiency anemia, unspecified: Secondary | ICD-10-CM

## 2012-02-09 DIAGNOSIS — I7410 Embolism and thrombosis of unspecified parts of aorta: Secondary | ICD-10-CM

## 2012-02-09 DIAGNOSIS — K8000 Calculus of gallbladder with acute cholecystitis without obstruction: Secondary | ICD-10-CM

## 2012-02-09 DIAGNOSIS — I7411 Embolism and thrombosis of thoracic aorta: Secondary | ICD-10-CM

## 2012-02-09 DIAGNOSIS — D510 Vitamin B12 deficiency anemia due to intrinsic factor deficiency: Secondary | ICD-10-CM

## 2012-02-09 DIAGNOSIS — K8042 Calculus of bile duct with acute cholecystitis without obstruction: Secondary | ICD-10-CM

## 2012-02-09 HISTORY — DX: Vitamin B12 deficiency anemia due to intrinsic factor deficiency: D51.0

## 2012-02-09 LAB — CHCC SATELLITE - SMEAR

## 2012-02-09 LAB — IRON AND TIBC
TIBC: 299 ug/dL (ref 250–470)
UIBC: 247 ug/dL (ref 125–400)

## 2012-02-09 LAB — CBC WITH DIFFERENTIAL (CANCER CENTER ONLY)
BASO%: 0.5 % (ref 0.0–2.0)
EOS%: 2.8 % (ref 0.0–7.0)
LYMPH#: 1.8 10*3/uL (ref 0.9–3.3)
MCH: 33 pg (ref 26.0–34.0)
MCHC: 33.1 g/dL (ref 32.0–36.0)
MONO%: 5.7 % (ref 0.0–13.0)
NEUT#: 9.5 10*3/uL — ABNORMAL HIGH (ref 1.5–6.5)
NEUT%: 76.3 % (ref 39.6–80.0)
RDW: 15.8 % — ABNORMAL HIGH (ref 11.1–15.7)

## 2012-02-09 MED ORDER — CYANOCOBALAMIN 1000 MCG/ML IJ SOLN
1000.0000 ug | Freq: Once | INTRAMUSCULAR | Status: AC
  Administered 2012-02-09: 1000 ug via INTRAMUSCULAR

## 2012-02-09 NOTE — Progress Notes (Signed)
This office note has been dictated.

## 2012-02-10 NOTE — Progress Notes (Signed)
DIAGNOSES: 1. Idiopathic aortic thrombosis. 2. Recurrent iron-deficiency anemia. 3. Pernicious anemia,acquired.  CURRENT THERAPY: 1. Xarelto 20 mg p.o. daily. 2. Vitamin B12 one milligram IM q. month as needed. 3. IV iron as indicated.  INTERIM HISTORY:  Ms. Kari Hahn comes in for her followup.  She is having more problems with abdominal pain.  Unfortunately, it looks like she has cholecystitis.  she has gallstones with gallbladder wall thickening. This I think is going to be her problem now.  I suspect that she may need to have her gallbladder taken out.  I have placed a call in to [facility] to see if one of the surgeons can help [place] out.  When we did her CT angiogram back in early August, there were no problems with respect to recurrence of the thrombus.  The CT scan, however, did show the cholecystitis.  She is trying to cut back on her smoking.  She is losing weight.  She is considering a gastric bypass.  She has had no bleeding.  She does do quite well with Iron.  We last gave her back in July.  She got 1020 mg of iron.  She has had some leg issues and some leg pain.  This is, again, more of a chronic issue.  She has had no fever.  There has been no headache.  PHYSICAL EXAMINATION:  This is an obese white female in no obvious distress.  Vital signs:  Temperature of 97.3, pulse 70, respiratory rate 22, blood pressure 110/60.  Weight is 227.  Head and neck: Normocephalic, atraumatic skull.  There are no ocular or oral lesions. There are no palpable cervical or supraclavicular lymph nodes.  Lungs: Clear bilaterally.  Cardiac:  Regular rate and rhythm with a normal S1 and S2.  There are no murmurs, rubs or bruits.  Abdomen:  Soft with good bowel sounds.  She is obese.  She has some tenderness in the right upper quadrant.  There is no fluid wave.  There is no deep palpitation secondary to tenderness in the right upper quadrant.  Extremities:  No clubbing,  cyanosis or edema.  She has good pulses in her distal extremities.  Skin:  Some slightly dry skin.  LABORATORIES STUDIES:  White cell count 12.5, hemoglobin 13.4, hematocrit 40.5, platelet count 416.  MCV is 100.  IMPRESSION:  Ms. Bakula is a 41 year old white female with an idiopathic aortic thrombosis.  She is on long-term anticoagulation.  I actually want to try to get her off anticoagulation in January 2014.  Again, I think her problem now is going to be gallbladder.  To me, it sounds like this is going to have to come out.  She is nauseated.  She is losing weight.  She has abdominal pain.  I think that this is the source of her current issues.  Again, we put a call in to [facility].  If she is going to have her gallbladder out, the Xarelto can be stopped probably 3 days before the procedure.  We will have Mr. Archuleta come back to see [place] in another month or 6 weeks. Hopefully by then, she would have had her gallbladder out.  It will be interesting to see what her white cells and platelets once the gallbladder is out.  These could be elevated based on inflammatory issues.    ______________________________ Volanda Napoleon, M.D. PRE/MEDQ  D:  02/09/2012  T:  02/10/2012  Job:  [id number]

## 2012-02-13 ENCOUNTER — Encounter (INDEPENDENT_AMBULATORY_CARE_PROVIDER_SITE_OTHER): Payer: Self-pay | Admitting: General Surgery

## 2012-02-13 ENCOUNTER — Ambulatory Visit (INDEPENDENT_AMBULATORY_CARE_PROVIDER_SITE_OTHER): Payer: PRIVATE HEALTH INSURANCE | Admitting: General Surgery

## 2012-02-13 VITALS — BP 100/78 | HR 92 | Temp 96.6°F | Resp 18 | Ht 62.0 in | Wt 224.8 lb

## 2012-02-13 DIAGNOSIS — K8020 Calculus of gallbladder without cholecystitis without obstruction: Secondary | ICD-10-CM

## 2012-02-13 NOTE — Patient Instructions (Addendum)
Strict nonfat to lowfat diet.  Eat small portions.  Stop Xarelto 5 days before surgery.

## 2012-02-13 NOTE — Progress Notes (Signed)
Patient ID: Kari Hahn, female   DOB: 08-18-1970, 41 y.o.   MRN: [id number]  No chief complaint on file.   HPI Kari Hahn is a 41 y.o. female.   HPI She is referred by Dr. Marin Olp due to upper abdominal pain and cholelithiasis. This began about 2 months ago. The pain is worse after eating specifically after eating a spicy meal. The pain does radiate around her right back and is associated with nausea. She's had approximately 12 pound weight loss since this because she is afraid to eat. She had ultrasound earlier this year demonstrating a 6 mm gallstone but no gallbladder wall thickening. She had a CT scan to follow up a clot in her aorta (which has resolved) and this demonstrated a gallbladder stone with some gallbladder wall thickening and question inflammatory change. This was done 2 weeks ago. She has a strong family history of gallbladder disease.  The symptoms are different then her Crohn's symptoms.  She is having some diarrhea.  Past Medical History  Diagnosis Date  . 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 Anemia, iron deficiency 09/28/2011  . Anemia, pernicious 02/09/2012  . Hypertension   . Morbid obesity with BMI of 40.0-44.9, adult   . GERD (gastroesophageal reflux disease)   . Anxiety     Crohn's disease  Past Surgical History  Procedure Date  . Cesarean section   . Laparoscopic nissen fundoplication   . Knee surgery     History reviewed. No pertinent family history.  Social History History  Substance Use Topics  . Smoking status: Not on file  . Smokeless tobacco: Not on file  . Alcohol Use:     Allergies  Allergen Reactions  . Eggs Or Egg-Derived Products   . Ibuprofen     Current Outpatient Prescriptions  Medication Sig Dispense Refill  . ALPRAZolam (XANAX) 0.5 MG tablet Take 0.5 mg by mouth at bedtime as needed.        Marland Kitchen amLODipine-valsartan (EXFORGE) 5-160 MG per tablet Take 1 tablet by mouth daily.        . Ascorbic Acid (VITAMIN C) 1000 MG tablet Take 1,000  mg by mouth daily.      . B Complex-C (B-COMPLEX WITH VITAMIN C) tablet Take 1 tablet by mouth daily.        . cetirizine (ZYRTEC) 10 MG tablet Take 10 mg by mouth daily.        . cholecalciferol (VITAMIN D) 1000 UNITS tablet Take 1,000 Units by mouth daily.        . folic acid (FOLVITE) 1 MG tablet Take 1 mg by mouth daily.        Marland Kitchen gabapentin (NEURONTIN) 600 MG tablet Take 600 mg by mouth 3 (three) times daily.        Marland Kitchen HYDROcodone-acetaminophen (MAXIDONE) 10-750 MG per tablet Take 1 tablet by mouth every 8 (eight) hours as needed.        . mesalamine (PENTASA) 500 MG CR capsule Take 1,000 mg by mouth 4 (four) times daily.       . potassium chloride (KLOR-CON) 10 MEQ CR tablet Take 10 mEq by mouth daily.        . Rivaroxaban (XARELTO) 20 MG TABS Take 20 mg by mouth daily at 12 noon.  30 tablet  6  . triamterene-hydrochlorothiazide (MAXZIDE-25) 37.5-25 MG per tablet Take 1 tablet by mouth daily. As needed      . escitalopram (LEXAPRO) 20 MG tablet Take 20 mg by mouth daily.  Review of Systems Review of Systems  Constitutional: Positive for unexpected weight change (loss). Negative for fever and chills.  Respiratory: Positive for cough.   Cardiovascular: Negative.   Gastrointestinal: Positive for nausea, diarrhea and anal bleeding.  Neurological: Positive for weakness and headaches.  Hematological:       Takes a blood thinner    Blood pressure 100/78, pulse 92, temperature 96.6 F (35.9 C), temperature source Temporal, resp. rate 18, height 5' 2"  (1.575 m), weight 224 lb 12.8 oz (101.969 kg), last menstrual period 01/17/2012.  Physical Exam Physical Exam  Constitutional:       Morbidly obese female in no acute distress. She is slightly anxious.  HENT:  Head: Normocephalic and atraumatic.  Eyes: EOM are normal. No scleral icterus.  Neck: Neck supple.  Cardiovascular: Normal rate and regular rhythm.   Pulmonary/Chest: Effort normal and breath sounds normal.  Abdominal:  Soft. She exhibits no mass. There is tenderness (Mild in RUQ and epigastrium). There is no guarding.       Small upper abdominal scars are present. A lower transverse scar is present. The abdomen is obese.  Musculoskeletal: She exhibits no edema.  Lymphadenopathy:    She has no cervical adenopathy.  Skin: Skin is warm and dry.    Data Reviewed Dr. Antonieta Pert note, [place], CT scan.  Assessment    Symptomatic cholelithiasis and a female who is chronically anticoagulated with Xarelto.    Plan    Laparoscopic possible open cholecystectomy. Hold the Xarelto preop.  I have explained the procedure, risks, and aftercare of cholecystectomy.  Risks include but are not limited to bleeding, infection, wound problems, anesthesia, diarrhea, bile leak, injury to common bile duct/liver/intestine DVT.  She seems to understand and agrees to proceed.        Monique Hefty J 02/13/2012, 2:52 PM

## 2012-03-11 ENCOUNTER — Other Ambulatory Visit (HOSPITAL_BASED_OUTPATIENT_CLINIC_OR_DEPARTMENT_OTHER): Payer: PRIVATE HEALTH INSURANCE | Admitting: Lab

## 2012-03-11 ENCOUNTER — Ambulatory Visit (HOSPITAL_BASED_OUTPATIENT_CLINIC_OR_DEPARTMENT_OTHER): Payer: PRIVATE HEALTH INSURANCE | Admitting: Medical

## 2012-03-11 VITALS — BP 109/45 | HR 89 | Temp 98.0°F | Resp 18 | Ht 62.0 in | Wt 221.0 lb

## 2012-03-11 DIAGNOSIS — D510 Vitamin B12 deficiency anemia due to intrinsic factor deficiency: Secondary | ICD-10-CM

## 2012-03-11 DIAGNOSIS — D509 Iron deficiency anemia, unspecified: Secondary | ICD-10-CM

## 2012-03-11 DIAGNOSIS — I7411 Embolism and thrombosis of thoracic aorta: Secondary | ICD-10-CM

## 2012-03-11 DIAGNOSIS — I7410 Embolism and thrombosis of unspecified parts of aorta: Secondary | ICD-10-CM

## 2012-03-11 LAB — CBC WITH DIFFERENTIAL (CANCER CENTER ONLY)
BASO#: 0.1 10*3/uL (ref 0.0–0.2)
BASO%: 0.6 % (ref 0.0–2.0)
Eosinophils Absolute: 0.4 10*3/uL (ref 0.0–0.5)
HCT: 39.3 % (ref 34.8–46.6)
HGB: 13 g/dL (ref 11.6–15.9)
LYMPH#: 2 10*3/uL (ref 0.9–3.3)
LYMPH%: 17.7 % (ref 14.0–48.0)
MCV: 100 fL (ref 81–101)
MONO#: 0.7 10*3/uL (ref 0.1–0.9)
NEUT%: 71.9 % (ref 39.6–80.0)
RDW: 15.3 % (ref 11.1–15.7)
WBC: 11.3 10*3/uL — ABNORMAL HIGH (ref 3.9–10.0)

## 2012-03-11 LAB — CHCC SATELLITE - SMEAR

## 2012-03-11 LAB — IRON AND TIBC
%SAT: 13 % — ABNORMAL LOW (ref 20–55)
TIBC: 328 ug/dL (ref 250–470)

## 2012-03-11 LAB — FERRITIN: Ferritin: 28 ng/mL (ref 10–291)

## 2012-03-11 NOTE — Progress Notes (Signed)
Diagnoses: #1 idiopathic, aortic thrombosis. #2 recurrent iron deficiency anemia. #3 pernicious anemia, acquired.  Current therapy: #1 Xarelto 20 mg by mouth daily. #2, vitamin B12 1 mg IM every month, as needed. #3, IV iron as indicated.  Interim history: Kari Hahn presents today for an office followup visit.  When she was here last.  She was continuing to have more problems with abdominal pain.  She did have a CT scan, which did reveal cholecystitis.  She did followup with Dr. Okey Dupre in bowel or who is going to perform a cholecystectomy, possibly, open on 03/29/2012.  We did advise her to stop her Xarelto 3, to 4 days before her surgery.  She still continues to try to cut back on her smoking.  She continues to do quite well with IV iron therapy.  She last received IV iron back in July.  She continues to have some leg issues, and leg pain.  Again, this is more of a chronic issue.  She reports, she still continuing to try and find, a primary care physician, that except Medicaid.  She does report an area of dry skin closed to the heel of her right foot.  This is actually on the medial side.  She reports, that she has had some chronic mild neuropathy in her feet, and hands.  She reports, that she is not a diabetic.  She denies any fevers.  She has a decent appetite.  She denies any nausea, vomiting, diarrhea, or constipation.  She denies any cough, chest pain, or shortness of breath.  She denies any fevers, chills, or night sweats.  She denies any headaches, visual changes, or any type of rashes.  Review of Systems:Intermittent RLQ abdominal pain, tenderness on the medial side of right heel, otherwise: Pt. Denies any changes in their vision, hearing, adenopathy, fevers, chills, nausea, vomiting, diarrhea, constipation, chest pain, shortness of breath, passing blood, passing out, blacking out,  any changes in skin, joints, neurologic or psychiatric except as noted.  Physical Exam: This is a pleasant,  41 year old, well-developed, well-nourished, white female, in no obvious distress Vitals: Temperature 90.0 degrees, pulse 89, respirations 18, blood pressure 109/45.  Weight 221 pounds HEENT reveals a normocephalic, atraumatic skull, no scleral icterus, no oral lesions  Neck is supple without any cervical or supraclavicular adenopathy.  Lungs are clear to auscultation bilaterally. There are no wheezes, rales or rhonci Cardiac is regular rate and rhythm with a normal S1 and S2. There are no murmurs, rubs, or bruits.  Abdomen is soft with good bowel sounds, there is no palpable mass. There is no palpable hepatosplenomegaly. There is no palpable fluid wave.  Musculoskeletal no tenderness of the spine, ribs, or hips.  Extremities there are no clubbing, cyanosis, or edema. -She does have some thick dry skin on the medial side of her right heel, no erythema or drainage, there is some ttp. Skin no petechia, purpura or ecchymosis Neurologic is nonfocal.  Laboratory Data: White count 1.3, hemoglobin 13.0, hematocrit 39.3, platelets are 468,000  Current Outpatient Prescriptions on File Prior to Visit  Medication Sig Dispense Refill  . ALPRAZolam (XANAX) 0.5 MG tablet Take 0.5 mg by mouth at bedtime as needed. Pt takes 1 or 2  At bedtime.      Marland Kitchen amLODipine-valsartan (EXFORGE) 5-160 MG per tablet Take 1 tablet by mouth daily.        . Ascorbic Acid (VITAMIN C) 1000 MG tablet Take 1,000 mg by mouth daily.      Marland Kitchen B  Complex-C (B-COMPLEX WITH VITAMIN C) tablet Take 1 tablet by mouth daily.        . cetirizine (ZYRTEC) 10 MG tablet Take 10 mg by mouth daily.        . cholecalciferol (VITAMIN D) 1000 UNITS tablet Take 1,000 Units by mouth daily.        . folic acid (FOLVITE) 1 MG tablet Take 1 mg by mouth daily.        Marland Kitchen gabapentin (NEURONTIN) 600 MG tablet Take 600 mg by mouth 3 (three) times daily.        Marland Kitchen HYDROcodone-acetaminophen (MAXIDONE) 10-750 MG per tablet Take 1 tablet by mouth every 8 (eight)  hours as needed.        . mesalamine (PENTASA) 500 MG CR capsule Take 1,000 mg by mouth 4 (four) times daily.       . potassium chloride (KLOR-CON) 10 MEQ CR tablet Take 10 mEq by mouth daily.        . Rivaroxaban (XARELTO) 20 MG TABS Take 20 mg by mouth daily at 12 noon.  30 tablet  6  . escitalopram (LEXAPRO) 20 MG tablet Take 20 mg by mouth daily.         Assessment/Plan: This is a 41 year old, female, with the following issues.  #1 idiopathic, aortic thrombosis.  She continues on long-term anticoagulation with Xarelto 20mg  daily.  We may try to get her off anticoagulation in January, 2014.  #2 cholecystitis-her possible open cholate cystectomy is scheduled for 03/29/2012.  She is to stop the Xarelto 3-4 days prior to her surgery.  We appreciate, Dr. Maris Berger help with this.  #3 followup we will follow back up with Kari Hahn in 6 weeks, but before then should there be questions or concerns.

## 2012-03-14 ENCOUNTER — Other Ambulatory Visit: Payer: Self-pay | Admitting: *Deleted

## 2012-03-14 DIAGNOSIS — F5105 Insomnia due to other mental disorder: Secondary | ICD-10-CM

## 2012-03-14 DIAGNOSIS — I7410 Embolism and thrombosis of unspecified parts of aorta: Secondary | ICD-10-CM

## 2012-03-14 MED ORDER — RIVAROXABAN 20 MG PO TABS: 20.0000 mg | ORAL_TABLET | Freq: Every day | ORAL | Status: DC

## 2012-03-14 MED ORDER — ALPRAZOLAM 0.5 MG PO TABS: 0.5000 mg | ORAL_TABLET | Freq: Every evening | ORAL | Status: DC | PRN

## 2012-03-14 NOTE — Telephone Encounter (Signed)
Pt called requesting an appt with a dermatologist. She has a rash that she wants to have biopsied prior to her surgery on 10/4. Asking to have Dr Myna Hidalgo do this as "Dr Camie Patience office has been reviewing my records everytime I have called so I haven't been able to get an appt". Explained that it may be very difficult to get a dermatology appt before 03/29/12 but will ask Dr Myna Hidalgo  & will call her back.  Called Dr Camie Patience office. They saw the pt in June & have no additional requests from the pt to be seen. Pt to call the office and have Dr Alberteen Sam evaluate the rash. She verbalized understanding and stated she would do so. In the meantime, will refill her Xanax & Xarelto.

## 2012-03-14 NOTE — Telephone Encounter (Signed)
Called patient to let her know that her iron levels are low and needs one dose of Feraheme 1020 mg in next week or so to decrease surgical complciations.  appt made with scheduler

## 2012-03-14 NOTE — Telephone Encounter (Signed)
Message copied by Anselm Jungling on Thu Mar 14, 2012 12:43 PM ------      Message from: Josph Macho      Created: Tue Mar 12, 2012  7:37 AM       Please call and let her know that her iron is low. She needs Feraheme at 1020mg  in the next week or so. She really needs this in order to decrease risk of surgery complications for her gallbladder surgery in early October. Thanks. Cindee Lame

## 2012-03-18 ENCOUNTER — Ambulatory Visit (HOSPITAL_BASED_OUTPATIENT_CLINIC_OR_DEPARTMENT_OTHER): Payer: PRIVATE HEALTH INSURANCE

## 2012-03-18 ENCOUNTER — Other Ambulatory Visit (INDEPENDENT_AMBULATORY_CARE_PROVIDER_SITE_OTHER): Payer: Self-pay | Admitting: General Surgery

## 2012-03-18 DIAGNOSIS — I7410 Embolism and thrombosis of unspecified parts of aorta: Secondary | ICD-10-CM

## 2012-03-18 DIAGNOSIS — D509 Iron deficiency anemia, unspecified: Secondary | ICD-10-CM

## 2012-03-18 DIAGNOSIS — D649 Anemia, unspecified: Secondary | ICD-10-CM

## 2012-03-18 MED ORDER — SODIUM CHLORIDE 0.45 % IV SOLN
Freq: Once | INTRAVENOUS | Status: DC
  Filled 2012-03-18: qty 1000

## 2012-03-18 MED ORDER — DEXTROSE 5 % IV SOLN: 1.0000 g | Freq: Once | INTRAVENOUS | Status: DC

## 2012-03-18 MED ORDER — SODIUM CHLORIDE 0.9 % IV SOLN
Freq: Once | INTRAVENOUS | Status: AC
  Administered 2012-03-18: 14:00:00 via INTRAVENOUS

## 2012-03-18 MED ORDER — DEXTROSE 5 % IV SOLN: 2.0000 g | INTRAVENOUS | Status: DC

## 2012-03-18 MED ORDER — SODIUM CHLORIDE 0.9 % IV SOLN
1020.0000 mg | Freq: Once | INTRAVENOUS | Status: AC
  Administered 2012-03-18: 1020 mg via INTRAVENOUS
  Filled 2012-03-18: qty 34

## 2012-03-20 ENCOUNTER — Encounter (HOSPITAL_COMMUNITY): Payer: Self-pay | Admitting: Respiratory Therapy

## 2012-03-25 ENCOUNTER — Encounter (HOSPITAL_COMMUNITY): Payer: Self-pay

## 2012-03-25 ENCOUNTER — Ambulatory Visit (HOSPITAL_COMMUNITY)
Admission: RE | Admit: 2012-03-25 | Discharge: 2012-03-25 | Disposition: A | Discharge status: Home / Self Care | Payer: PRIVATE HEALTH INSURANCE | Source: Ambulatory Visit | Attending: General Surgery | Admitting: General Surgery

## 2012-03-25 ENCOUNTER — Encounter (HOSPITAL_COMMUNITY)
Admission: RE | Admit: 2012-03-25 | Discharge: 2012-03-25 | Disposition: A | Discharge status: Home / Self Care | Payer: PRIVATE HEALTH INSURANCE | Source: Ambulatory Visit | Attending: General Surgery | Admitting: General Surgery

## 2012-03-25 DIAGNOSIS — R0989 Other specified symptoms and signs involving the circulatory and respiratory systems: Secondary | ICD-10-CM | POA: Insufficient documentation

## 2012-03-25 DIAGNOSIS — K8020 Calculus of gallbladder without cholecystitis without obstruction: Secondary | ICD-10-CM | POA: Insufficient documentation

## 2012-03-25 DIAGNOSIS — Z01818 Encounter for other preprocedural examination: Secondary | ICD-10-CM | POA: Insufficient documentation

## 2012-03-25 DIAGNOSIS — Z01812 Encounter for preprocedural laboratory examination: Secondary | ICD-10-CM | POA: Insufficient documentation

## 2012-03-25 HISTORY — DX: Unspecified mononeuropathy of unspecified lower limb: G57.90

## 2012-03-25 LAB — CBC WITH DIFFERENTIAL/PLATELET
Basophils Relative: 0 % (ref 0–1)
Eosinophils Absolute: 0.4 10*3/uL (ref 0.0–0.7)
MCH: 33.3 pg (ref 26.0–34.0)
MCHC: 33.4 g/dL (ref 30.0–36.0)
Monocytes Relative: 6 % (ref 3–12)
Neutrophils Relative %: 74 % (ref 43–77)
Platelets: 620 10*3/uL — ABNORMAL HIGH (ref 150–400)

## 2012-03-25 LAB — COMPREHENSIVE METABOLIC PANEL
Albumin: 3.4 g/dL — ABNORMAL LOW (ref 3.5–5.2)
Alkaline Phosphatase: 93 U/L (ref 39–117)
BUN: 7 mg/dL (ref 6–23)
Calcium: 9.8 mg/dL (ref 8.4–10.5)
Potassium: 3.5 mEq/L (ref 3.5–5.1)
Sodium: 137 mEq/L (ref 135–145)
Total Protein: 7.1 g/dL (ref 6.0–8.3)

## 2012-03-25 LAB — TYPE AND SCREEN
ABO/RH(D): O POS
Antibody Screen: NEGATIVE

## 2012-03-25 LAB — PROTIME-INR: Prothrombin Time: 13.6 seconds (ref 11.6–15.2)

## 2012-03-25 LAB — SURGICAL PCR SCREEN: Staphylococcus aureus: NEGATIVE

## 2012-03-25 NOTE — Pre-Procedure Instructions (Signed)
20 Kari Hahn  03/25/2012   Your procedure is scheduled on:  Friday March 29, 2012 at 0730 AM  Report to [facility] at [phone].  Call this number if you have problems the morning of surgery: [phone]   Remember:   Do not eat food or drink:After Midnight.Thursday     Take these medicines the morning of surgery with A SIP OF WATER: Xanax, Neurontin,  Hydrocodone-Acetaminophen if needed, and Pentasa. Xarelto will be stopped 03/26/2012   Do not wear jewelry, make-up or nail polish.  Do not wear lotions, powders, or perfumes. You may wear deodorant.  Do not shave 48 hours prior to surgery.   Do not bring valuables to the hospital.  Contacts, dentures or bridgework may not be worn into surgery.  Leave suitcase in the car. After surgery it may be brought to your room.  For patients admitted to the hospital, checkout time is 11:00 AM the day of discharge.   Patients discharged the day of surgery will not be allowed to drive home.    Special Instructions: Shower using CHG 2 nights before surgery and the night before surgery.  If you shower the day of surgery use CHG.  Use special wash - you have one bottle of CHG for all showers.  You should use approximately 1/3 of the bottle for each shower.   Please read over the following fact sheets that you were given: Pain Booklet, Coughing and Deep Breathing, MRSA Information and Surgical Site Infection Prevention

## 2012-03-26 ENCOUNTER — Encounter (HOSPITAL_COMMUNITY): Payer: Self-pay | Admitting: Vascular Surgery

## 2012-03-26 NOTE — Consult Note (Signed)
Anesthesia Chart Review:  Patient is a 41 year old female posted for a laparoscopic cholecystectomy on 03/29/12 by Dr. Abbey Chatters.  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 Myra Gianotti who recommended lifelong anticoagulation; now followed by hematologist Dr. Myna Hidalgo).  She was seen by Eunice Blase, PA with hematology on 03/11/12 for pre-operative anticoagulation recommendation and told she may stop Xarelto 3-4 days prior to surgery.  GI is Dr. Bosie Clos.  Echo on 08/06/09 showed: - Left ventricle: The cavity size was normal. The estimated ejection fraction was 60%. Wall motion was normal; there were no regional wall motion abnormalities. Doppler parameters are consistent with abnormal left ventricular relaxation (grade 1 diastolic dysfunction). - Left atrium: The atrium was mildly dilated.  CXR on 03/25/12 showed no acute abnormalities.  Follow-up CTA on 01/31/12 showed no recurrent aortic thrombus,cholelithiasis with gallbladder wall prominence and adjacent inflammatory/edematous changes suggesting acute cholecystitis, no significant visceral or renal artery proximal occlusive disease.  Labs noted.  WBC 18.7, PLT 620.  PT/INR WNL.  I spoke with one of the triage nurses at [facility] who stated that Dr. Abbey Chatters had reviewed labs and did not leave any orders for additional pre-operative testing.    The ordered was released, but it appears that her EKG was not done at her PAT appointment, so will need to be done on the day of surgery.  Shonna Chock, PA-C

## 2012-03-27 ENCOUNTER — Telehealth: Payer: Self-pay | Admitting: Hematology & Oncology

## 2012-03-27 NOTE — Telephone Encounter (Signed)
Kari Hahn called thing that she may have taken a Xarelto pill today. She is scheduled for a gallbladder removal on October 4.  I called her. She says that she had her prescription filled on September 19. At that point in time she had 6 pills left from her prior prescription. She said that she did not take a pill last week because she has some abdominal distress. She says she is 26 pills left. I am calculated that she must not have taken a pleural today if she still had 26 pills remaining.  Now going by the package insert, it is recommended that Xarelto not be taking at least 24 hours prior to a planned surgical procedure. She said that it she had taken a pill today it would be at 2 PM. Her surgery is not scheduled until Friday, October 4 at 7:30 AM. I feel that she will still be safe to have surgery.  We will let Dr. Zella Richer know of the situation. Again, I feel that she'll still be okay to have surgery on Friday. It is possible that that Dr. Zella Richer might be able to put her surgery off until later in the day just to be on the safe side.  Pete e.

## 2012-03-29 ENCOUNTER — Ambulatory Visit (HOSPITAL_COMMUNITY): Payer: PRIVATE HEALTH INSURANCE | Admitting: Vascular Surgery

## 2012-03-29 ENCOUNTER — Encounter (HOSPITAL_COMMUNITY): Payer: Self-pay | Admitting: Vascular Surgery

## 2012-03-29 ENCOUNTER — Ambulatory Visit (HOSPITAL_COMMUNITY)
Admission: RE | Admit: 2012-03-29 | Discharge: 2012-03-29 | Disposition: A | Discharge status: Home / Self Care | Payer: PRIVATE HEALTH INSURANCE | Source: Ambulatory Visit | Attending: General Surgery | Admitting: General Surgery

## 2012-03-29 ENCOUNTER — Encounter (HOSPITAL_COMMUNITY)
Admission: RE | Disposition: A | Discharge status: Home / Self Care | Payer: Self-pay | Source: Ambulatory Visit | Attending: General Surgery

## 2012-03-29 ENCOUNTER — Ambulatory Visit (HOSPITAL_COMMUNITY): Payer: PRIVATE HEALTH INSURANCE

## 2012-03-29 DIAGNOSIS — K8020 Calculus of gallbladder without cholecystitis without obstruction: Secondary | ICD-10-CM | POA: Insufficient documentation

## 2012-03-29 DIAGNOSIS — I10 Essential (primary) hypertension: Secondary | ICD-10-CM | POA: Insufficient documentation

## 2012-03-29 DIAGNOSIS — K811 Chronic cholecystitis: Secondary | ICD-10-CM

## 2012-03-29 DIAGNOSIS — F17200 Nicotine dependence, unspecified, uncomplicated: Secondary | ICD-10-CM | POA: Insufficient documentation

## 2012-03-29 HISTORY — PX: CHOLECYSTECTOMY: SHX55

## 2012-03-29 SURGERY — LAPAROSCOPIC CHOLECYSTECTOMY WITH INTRAOPERATIVE CHOLANGIOGRAM
Anesthesia: General | Site: Abdomen | Wound class: Clean Contaminated

## 2012-03-29 MED ORDER — MIDAZOLAM HCL 5 MG/5ML IJ SOLN
INTRAMUSCULAR | Status: DC | PRN
  Administered 2012-03-29: 2 mg via INTRAVENOUS

## 2012-03-29 MED ORDER — ROCURONIUM BROMIDE 100 MG/10ML IV SOLN
INTRAVENOUS | Status: DC | PRN
  Administered 2012-03-29: 40 mg via INTRAVENOUS

## 2012-03-29 MED ORDER — ONDANSETRON HCL 4 MG/2ML IJ SOLN
INTRAMUSCULAR | Status: AC
  Filled 2012-03-29: qty 2

## 2012-03-29 MED ORDER — SUCCINYLCHOLINE CHLORIDE 20 MG/ML IJ SOLN
INTRAMUSCULAR | Status: DC | PRN
  Administered 2012-03-29: 100 mg via INTRAVENOUS

## 2012-03-29 MED ORDER — HYDROMORPHONE HCL PF 1 MG/ML IJ SOLN
INTRAMUSCULAR | Status: AC
  Filled 2012-03-29: qty 1

## 2012-03-29 MED ORDER — BUPIVACAINE-EPINEPHRINE PF 0.25-1:200000 % IJ SOLN
INTRAMUSCULAR | Status: AC
  Filled 2012-03-29: qty 30

## 2012-03-29 MED ORDER — SODIUM CHLORIDE 0.9 % IV SOLN
INTRAVENOUS | Status: DC | PRN
  Administered 2012-03-29: 07:00:00

## 2012-03-29 MED ORDER — DEXTROSE IN LACTATED RINGERS 5 % IV SOLN
INTRAVENOUS | Status: DC
  Administered 2012-03-29: 10:00:00 via INTRAVENOUS

## 2012-03-29 MED ORDER — OXYCODONE HCL 5 MG PO TABS
5.0000 mg | ORAL_TABLET | Freq: Once | ORAL | Status: AC | PRN
  Administered 2012-03-29: 5 mg via ORAL

## 2012-03-29 MED ORDER — PROPOFOL 10 MG/ML IV BOLUS
INTRAVENOUS | Status: DC | PRN
  Administered 2012-03-29: 200 mg via INTRAVENOUS

## 2012-03-29 MED ORDER — SODIUM CHLORIDE 0.9 % IR SOLN
Status: DC | PRN
  Administered 2012-03-29: 1000 mL

## 2012-03-29 MED ORDER — OXYCODONE HCL 5 MG PO TABS
ORAL_TABLET | ORAL | Status: AC
  Filled 2012-03-29: qty 1

## 2012-03-29 MED ORDER — ONDANSETRON HCL 4 MG/2ML IJ SOLN
4.0000 mg | Freq: Once | INTRAMUSCULAR | Status: AC
  Administered 2012-03-29: 4 mg via INTRAVENOUS

## 2012-03-29 MED ORDER — ONDANSETRON HCL 4 MG/2ML IJ SOLN
INTRAMUSCULAR | Status: DC | PRN
  Administered 2012-03-29: 4 mg via INTRAVENOUS

## 2012-03-29 MED ORDER — LACTATED RINGERS IV SOLN
INTRAVENOUS | Status: DC | PRN
  Administered 2012-03-29: 07:00:00 via INTRAVENOUS

## 2012-03-29 MED ORDER — SODIUM CHLORIDE 0.9 % IR SOLN
Status: DC | PRN
  Administered 2012-03-29: 1

## 2012-03-29 MED ORDER — OXYCODONE-ACETAMINOPHEN 5-325 MG PO TABS: 1.0000 | ORAL_TABLET | ORAL | Status: DC | PRN

## 2012-03-29 MED ORDER — FENTANYL CITRATE 0.05 MG/ML IJ SOLN
INTRAMUSCULAR | Status: DC | PRN
  Administered 2012-03-29: 100 ug via INTRAVENOUS
  Administered 2012-03-29: 50 ug via INTRAVENOUS
  Administered 2012-03-29: 100 ug via INTRAVENOUS

## 2012-03-29 MED ORDER — OXYCODONE HCL 5 MG/5ML PO SOLN: 5.0000 mg | Freq: Once | ORAL | Status: AC | PRN

## 2012-03-29 MED ORDER — HYDROMORPHONE HCL PF 1 MG/ML IJ SOLN
0.2500 mg | INTRAMUSCULAR | Status: DC | PRN
  Administered 2012-03-29 (×2): 0.5 mg via INTRAVENOUS

## 2012-03-29 MED ORDER — NEOSTIGMINE METHYLSULFATE 1 MG/ML IJ SOLN
INTRAMUSCULAR | Status: DC | PRN
  Administered 2012-03-29: 4 mg via INTRAVENOUS

## 2012-03-29 MED ORDER — GLYCOPYRROLATE 0.2 MG/ML IJ SOLN
INTRAMUSCULAR | Status: DC | PRN
  Administered 2012-03-29: 0.6 mg via INTRAVENOUS

## 2012-03-29 MED ORDER — CEFAZOLIN SODIUM 1-5 GM-% IV SOLN
INTRAVENOUS | Status: DC | PRN
  Administered 2012-03-29 (×2): 1 g via INTRAVENOUS

## 2012-03-29 MED ORDER — DROPERIDOL 2.5 MG/ML IJ SOLN: 0.6250 mg | INTRAMUSCULAR | Status: DC | PRN

## 2012-03-29 MED ORDER — ALBUTEROL SULFATE (5 MG/ML) 0.5% IN NEBU
INHALATION_SOLUTION | RESPIRATORY_TRACT | Status: AC
  Administered 2012-03-29: 2.5 mg
  Filled 2012-03-29: qty 0.5

## 2012-03-29 MED ORDER — BUPIVACAINE-EPINEPHRINE 0.25% -1:200000 IJ SOLN
INTRAMUSCULAR | Status: DC | PRN
  Administered 2012-03-29: 13 mL

## 2012-03-29 SURGICAL SUPPLY — 39 items
APPLIER CLIP 5 13 M/L LIGAMAX5 (MISCELLANEOUS) ×2
BENZOIN TINCTURE PRP APPL 2/3 (GAUZE/BANDAGES/DRESSINGS) ×2 IMPLANT
CANISTER SUCTION 2500CC (MISCELLANEOUS) ×2 IMPLANT
CHLORAPREP W/TINT 26ML (MISCELLANEOUS) ×2 IMPLANT
CLIP APPLIE 5 13 M/L LIGAMAX5 (MISCELLANEOUS) ×1 IMPLANT
CLOTH BEACON ORANGE TIMEOUT ST (SAFETY) ×2 IMPLANT
COVER MAYO STAND STRL (DRAPES) ×2 IMPLANT
COVER SURGICAL LIGHT HANDLE (MISCELLANEOUS) ×2 IMPLANT
DECANTER SPIKE VIAL GLASS SM (MISCELLANEOUS) ×4 IMPLANT
DRAPE C-ARM 42X72 X-RAY (DRAPES) ×2 IMPLANT
ELECT REM PT RETURN 9FT ADLT (ELECTROSURGICAL) ×2
ELECTRODE REM PT RTRN 9FT ADLT (ELECTROSURGICAL) ×1 IMPLANT
GAUZE SPONGE 2X2 8PLY STRL LF (GAUZE/BANDAGES/DRESSINGS) ×1 IMPLANT
GLOVE BIOGEL PI IND STRL 7.0 (GLOVE) ×1 IMPLANT
GLOVE BIOGEL PI IND STRL 8 (GLOVE) ×1 IMPLANT
GLOVE BIOGEL PI INDICATOR 7.0 (GLOVE) ×1
GLOVE BIOGEL PI INDICATOR 8 (GLOVE) ×1
GLOVE ECLIPSE 8.0 STRL XLNG CF (GLOVE) ×2 IMPLANT
GLOVE SURG SS PI 6.5 STRL IVOR (GLOVE) ×2 IMPLANT
GOWN STRL NON-REIN LRG LVL3 (GOWN DISPOSABLE) ×8 IMPLANT
KIT BASIN OR (CUSTOM PROCEDURE TRAY) ×2 IMPLANT
KIT ROOM TURNOVER OR (KITS) ×2 IMPLANT
NS IRRIG 1000ML POUR BTL (IV SOLUTION) ×2 IMPLANT
PAD ARMBOARD 7.5X6 YLW CONV (MISCELLANEOUS) ×2 IMPLANT
POUCH SPECIMEN RETRIEVAL 10MM (ENDOMECHANICALS) ×2 IMPLANT
SCISSORS LAP 5X35 DISP (ENDOMECHANICALS) IMPLANT
SET CHOLANGIOGRAPH 5 50 .035 (SET/KITS/TRAYS/PACK) ×2 IMPLANT
SET IRRIG TUBING LAPAROSCOPIC (IRRIGATION / IRRIGATOR) ×2 IMPLANT
SLEEVE ENDOPATH XCEL 5M (ENDOMECHANICALS) ×4 IMPLANT
SPECIMEN JAR SMALL (MISCELLANEOUS) ×2 IMPLANT
SPONGE GAUZE 2X2 STER 10/PKG (GAUZE/BANDAGES/DRESSINGS) ×1
STRIP CLOSURE SKIN 1/2X4 (GAUZE/BANDAGES/DRESSINGS) ×2 IMPLANT
SUT MON AB 4-0 PC3 18 (SUTURE) ×2 IMPLANT
TOWEL OR 17X24 6PK STRL BLUE (TOWEL DISPOSABLE) ×2 IMPLANT
TOWEL OR 17X26 10 PK STRL BLUE (TOWEL DISPOSABLE) ×2 IMPLANT
TRAY LAPAROSCOPIC (CUSTOM PROCEDURE TRAY) ×2 IMPLANT
TROCAR XCEL BLUNT TIP 100MML (ENDOMECHANICALS) ×2 IMPLANT
TROCAR XCEL NON-BLD 11X100MML (ENDOMECHANICALS) IMPLANT
TROCAR XCEL NON-BLD 5MMX100MML (ENDOMECHANICALS) ×2 IMPLANT

## 2012-03-29 NOTE — Op Note (Signed)
Preoperative diagnosis:  Symptomatic cholelithiasis  Postoperative diagnosis:  Same  Procedure: Laparoscopic cholecystectomy with cholangiogram.  Surgeon: Jackolyn Confer, M.D.  Asst.:  Nedra Hai, M.D.  Anesthesia: General  Indication:   This is a 41 year old female whose been having episodes of upper abdominal pain. Extensive workup has been performed that she's been noted to have cholelithiasis. Her symptoms are somewhat compatible with biliary colic. She now presents for cholecystectomy. The procedure, risks, and after care were discussed with her preoperatively.  Technique: She was brought to the operating room, placed supine on the operating table, and a general anesthetic was administered.  The abdominal wall was then sterilely prepped and draped. Local anesthetic (Marcaine) was infiltrated in the supraumbilical region. A small supraumbilical incision was made through the skin, subcutaneous tissue, fascia, and peritoneum entering the peritoneal cavity under direct vision. A pursestring suture of 0 Vicryl was placed around the edges of the fascia. A Hassan trocar was introduced into the peritoneal cavity and a pneumoperitoneum was created by insufflation of carbon dioxide gas. The laparoscope was introduced into the trocar and no underlying bleeding or organ injury was noted. The patient was then placed in the reverse Trendelenburg position with the right side tilted slightly up.  Three more trochars were then placed into the abdominal cavity under laparoscopic vision. One in the epigastric area, and 2 in the right upper quadrant area. The gallbladder was visualized and no acute inflammatory changes were note.   The fundus was grasped and retracted toward the right shoulder.  The infundibulum was mobilized with dissection close to the gallbladder and retracted laterally. The cystic duct was identified and a window was created around it. The cystic artery was also identified and a window was  created around it.  It was clipped and divided. The critical view was achieved. A clip was placed at the neck of the gallbladder. A small incision was made in the cystic duct. A cholangiocatheter was introduced through the anterior abdominal wall and placed in the cystic duct. A intraoperative cholangiogram was then performed.  Under real-time fluoroscopy, dilute contrast was injected into the cystic duct.  The common hepatic duct, the right and left hepatic ducts, and the common duct were all visualized. Contrast drained into the duodenum without obvious evidence of any obstructing ductal lesion. The final report is pending the Radiologist's interpretation.  The cholangiocatheter was removed, the cystic duct was clipped 3 times on the biliary side, and then the cystic duct was divided sharply. No bile leak was noted from the cystic duct stump.   Following this the gallbladder was dissected free from the liver using electrocautery.  There was some bile leakage from the gallbladder, but no stones leaked out of the gallbladder. The gallbladder was then placed in a retrieval bag and removed from the abdominal cavity through the subumbilical incision.  The gallbladder fossa was inspected, irrigated, and bleeding was controlled with electrocautery. Inspection showed that hemostasis was adequate and there was no evidence of bile leak.  The irrigation fluid was evacuated as much as possible.  The subumbilical trocar was removed and the fascial defect was closed by tightening and tying down the pursestring suture under laparoscopic vision.  The remaining trochars were removed and the pneumoperitoneum was released. The skin incisions were closed with 4-0 Monocryl subcuticular stitches. Steri-Strips and sterile dressings were applied.  The procedure was well-tolerated without any apparent complications. The patient was taken to the recovery room in satisfactory condition.

## 2012-03-29 NOTE — Anesthesia Postprocedure Evaluation (Signed)
  Anesthesia Post-op Note  Patient: Kari Hahn  Procedure(s) Performed: Procedure(s) (LRB) with comments: LAPAROSCOPIC CHOLECYSTECTOMY WITH INTRAOPERATIVE CHOLANGIOGRAM (N/A) - laparoscopic cholecystectomy with intraopertaive choloangiogram  Patient Location: PACU  Anesthesia Type: General  Level of Consciousness: sedated  Airway and Oxygen Therapy: Patient connected to face mask oxygen  Post-op Pain: mild  Post-op Assessment: RESPIRATORY FUNCTION UNSTABLE plan to monitor in Short stay to determine if pt can be discharged home  Post-op Vital Signs: Reviewed  Complications: No apparent anesthesia complications

## 2012-03-29 NOTE — H&P (Signed)
Kari Hahn is an 41 y.o. female.   Chief Complaint:   Here for elective laparoscopic cholecystectomy HPI:   She has upper abdominal pain and cholelithiasis and presents for the above operation.  She has been off her Xarelto.  Past Medical History  Diagnosis Date  . 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 Anemia, iron deficiency 09/28/2011  . Anemia, pernicious 02/09/2012  . Hypertension   . Morbid obesity with BMI of 40.0-44.9, adult   . GERD (gastroesophageal reflux disease)   . Anxiety   . Neuropathy of leg     right leg  . Crohn disease     Past Surgical History  Procedure Date  . Cesarean section   . Laparoscopic nissen fundoplication   . Knee surgery     No family history on file. Social History:  reports that she has been smoking Cigarettes.  She has a 36 pack-year smoking history. She has never used smokeless tobacco. She reports that she does not drink alcohol or use illicit drugs.  Allergies:  Allergies  Allergen Reactions  . Eggs Or Egg-Derived Products   . Ibuprofen     Medications Prior to Admission  Medication Sig Dispense Refill  . ALPRAZolam (XANAX) 0.5 MG tablet Take 1 tablet (0.5 mg total) by mouth at bedtime as needed for sleep.  30 tablet  1  . amLODipine-valsartan (EXFORGE) 5-160 MG per tablet Take 1 tablet by mouth daily.        . Ascorbic Acid (VITAMIN C) 1000 MG tablet Take 1,000 mg by mouth daily.      . B Complex-C (B-COMPLEX WITH VITAMIN C) tablet Take 1 tablet by mouth daily.        . cetirizine (ZYRTEC) 10 MG tablet Take 10 mg by mouth daily.        . cholecalciferol (VITAMIN D) 1000 UNITS tablet Take 1,000 Units by mouth daily.        . clindamycin (CLEOCIN T) 1 % lotion Apply 1 application topically Daily.      . folic acid (FOLVITE) 1 MG tablet Take 1 mg by mouth daily.        Marland Kitchen gabapentin (NEURONTIN) 600 MG tablet Take 600 mg by mouth 3 (three) times daily.        Marland Kitchen HYDROcodone-acetaminophen (MAXIDONE) 10-750 MG per tablet Take 1 tablet by mouth  every 6 (six) hours as needed.       . mesalamine (PENTASA) 500 MG CR capsule Take 1,000 mg by mouth 4 (four) times daily.       . potassium chloride (KLOR-CON) 10 MEQ CR tablet Take 10 mEq by mouth daily.        . promethazine (PHENERGAN) 25 MG tablet Take 25 mg by mouth every 6 (six) hours as needed.      . Rivaroxaban (XARELTO) 20 MG TABS Take 1 tablet (20 mg total) by mouth daily at 12 noon.  30 tablet  1  . zinc gluconate 50 MG tablet Take 50 mg by mouth daily.        No results found for this or any previous visit (from the past 48 hour(s)). No results found.  Review of Systems  Constitutional: Negative for fever and chills.  Gastrointestinal: Negative for vomiting and diarrhea.    Blood pressure 103/70, pulse 84, temperature 98.1 F (36.7 C), temperature source Oral, resp. rate 20, SpO2 94.00%. Physical Exam  Constitutional:       Morbidly obese female, anxious.  HENT:  Head: Normocephalic and atraumatic.  Cardiovascular: Normal rate and regular rhythm.   Respiratory: Effort normal and breath sounds normal.  GI: Soft. She exhibits no distension and no mass.  Skin: Skin is warm and dry.     Assessment/Plan Symptomatic cholelithiasis  Plan:  Laparoscopic cholecystectomy.  Maximiliano Cromartie J 03/29/2012, 7:44 AM

## 2012-03-29 NOTE — Anesthesia Preprocedure Evaluation (Addendum)
Anesthesia Evaluation  Patient identified by MRN, date of birth, ID band Patient awake    Reviewed: Allergy & Precautions, H&P , NPO status , Patient's Chart, lab work & pertinent test results  History of Anesthesia Complications Negative for: history of anesthetic complications  Airway Mallampati: I TM Distance: >3 FB Neck ROM: Full    Dental  (+) Teeth Intact, Dental Advisory Given, Edentulous Upper and Edentulous Lower   Pulmonary Current Smoker,    Pulmonary exam normal       Cardiovascular hypertension, Pt. on medications     Neuro/Psych Anxiety negative neurological ROS     GI/Hepatic Neg liver ROS, GERD-  Medicated,  Endo/Other  negative endocrine ROSMorbid obesity  Renal/GU negative Renal ROS     Musculoskeletal   Abdominal   Peds  Hematology   Anesthesia Other Findings   Reproductive/Obstetrics                          Anesthesia Physical Anesthesia Plan  ASA: III  Anesthesia Plan: General   Post-op Pain Management:    Induction: Intravenous  Airway Management Planned: Oral ETT  Additional Equipment:   Intra-op Plan:   Post-operative Plan: Extubation in OR  Informed Consent: I have reviewed the patients History and Physical, chart, labs and discussed the procedure including the risks, benefits and alternatives for the proposed anesthesia with the patient or authorized representative who has indicated his/her understanding and acceptance.   Dental advisory given  Plan Discussed with: CRNA, Anesthesiologist and Surgeon  Anesthesia Plan Comments:         Anesthesia Quick Evaluation

## 2012-03-29 NOTE — Transfer of Care (Signed)
Immediate Anesthesia Transfer of Care Note  Patient: Kari Hahn  Procedure(s) Performed: Procedure(s) (LRB) with comments: LAPAROSCOPIC CHOLECYSTECTOMY WITH INTRAOPERATIVE CHOLANGIOGRAM (N/A) - laparoscopic cholecystectomy with intraopertaive choloangiogram  Patient Location: PACU  Anesthesia Type: General  Level of Consciousness: awake, oriented and patient cooperative  Airway & Oxygen Therapy: Patient Spontanous Breathing, Patient connected to face mask oxygen and Enc CDB; Neb tx in PACU to improve sats  Post-op Assessment: Report given to PACU RN and Post -op Vital signs reviewed and stable  Post vital signs: Reviewed and stable  Complications: No apparent anesthesia complications

## 2012-03-29 NOTE — Interval H&P Note (Signed)
History and Physical Interval Note:  03/29/2012 7:46 AM  Kari Hahn  has presented today for surgery, with the diagnosis of gallstones cholelithiasis  The various methods of treatment have been discussed with the patient and family. After consideration of risks, benefits and other options for treatment, the patient has consented to  Procedure(s) (LRB) with comments: LAPAROSCOPIC CHOLECYSTECTOMY WITH INTRAOPERATIVE CHOLANGIOGRAM (N/A) - laparoscopic cholecystectomy with intraopertaive choloangiogram as a surgical intervention .  The patient's history has been reviewed, patient examined, no change in status, stable for surgery.  I have reviewed the patient's chart and labs.  Questions were answered to the patient's satisfaction.     Ritaj Dullea Lenna Sciara

## 2012-04-01 ENCOUNTER — Encounter (HOSPITAL_COMMUNITY): Payer: Self-pay | Admitting: General Surgery

## 2012-04-05 ENCOUNTER — Other Ambulatory Visit: Payer: Self-pay | Admitting: *Deleted

## 2012-04-05 DIAGNOSIS — R52 Pain, unspecified: Secondary | ICD-10-CM

## 2012-04-05 MED ORDER — HYDROCODONE-ACETAMINOPHEN 10-650 MG PO TABS: 1.0000 | ORAL_TABLET | Freq: Three times a day (TID) | ORAL | Status: DC | PRN

## 2012-04-16 ENCOUNTER — Ambulatory Visit (INDEPENDENT_AMBULATORY_CARE_PROVIDER_SITE_OTHER): Payer: PRIVATE HEALTH INSURANCE | Admitting: General Surgery

## 2012-04-16 ENCOUNTER — Encounter (INDEPENDENT_AMBULATORY_CARE_PROVIDER_SITE_OTHER): Payer: Self-pay | Admitting: General Surgery

## 2012-04-16 VITALS — BP 136/82 | HR 86 | Temp 98.2°F | Resp 18 | Ht 64.0 in | Wt 212.4 lb

## 2012-04-16 DIAGNOSIS — Z9889 Other specified postprocedural states: Secondary | ICD-10-CM

## 2012-04-16 NOTE — Progress Notes (Signed)
She is here for a postop visit following laparoscopic cholecystectomy.  Diet is being tolerated, bowels are moving.  Had some diarrhea initially but this has improved.  No problems with incisions.  Pathology demonstrates chronic cholecystitis.  PE:  ABD:  Soft, incisions clean/dry/intact and solid.  Assessment:  Doing well postop.  Plan:  Lowfat diet recommended.  Activities as tolerated.  Return visit prn.

## 2012-04-16 NOTE — Patient Instructions (Signed)
Lowfat diet.  Activities as tolerated as discussed.

## 2012-04-22 ENCOUNTER — Ambulatory Visit (HOSPITAL_BASED_OUTPATIENT_CLINIC_OR_DEPARTMENT_OTHER): Payer: PRIVATE HEALTH INSURANCE

## 2012-04-22 ENCOUNTER — Other Ambulatory Visit (HOSPITAL_BASED_OUTPATIENT_CLINIC_OR_DEPARTMENT_OTHER): Payer: PRIVATE HEALTH INSURANCE | Admitting: Lab

## 2012-04-22 ENCOUNTER — Ambulatory Visit (HOSPITAL_BASED_OUTPATIENT_CLINIC_OR_DEPARTMENT_OTHER): Payer: Medicaid Other | Admitting: Hematology & Oncology

## 2012-04-22 VITALS — BP 100/60 | HR 18 | Temp 97.8°F | Resp 18 | Ht 64.0 in | Wt 213.0 lb

## 2012-04-22 DIAGNOSIS — D473 Essential (hemorrhagic) thrombocythemia: Secondary | ICD-10-CM

## 2012-04-22 DIAGNOSIS — D510 Vitamin B12 deficiency anemia due to intrinsic factor deficiency: Secondary | ICD-10-CM

## 2012-04-22 DIAGNOSIS — D509 Iron deficiency anemia, unspecified: Secondary | ICD-10-CM

## 2012-04-22 DIAGNOSIS — I7411 Embolism and thrombosis of thoracic aorta: Secondary | ICD-10-CM

## 2012-04-22 DIAGNOSIS — I7410 Embolism and thrombosis of unspecified parts of aorta: Secondary | ICD-10-CM

## 2012-04-22 LAB — CBC WITH DIFFERENTIAL (CANCER CENTER ONLY)
BASO#: 0.1 10*3/uL (ref 0.0–0.2)
EOS%: 2.5 % (ref 0.0–7.0)
HGB: 12.6 g/dL (ref 11.6–15.9)
MCH: 33.8 pg (ref 26.0–34.0)
MCHC: 33.7 g/dL (ref 32.0–36.0)
MONO%: 5.8 % (ref 0.0–13.0)
NEUT#: 9.8 10*3/uL — ABNORMAL HIGH (ref 1.5–6.5)
Platelets: 529 10*3/uL — ABNORMAL HIGH (ref 145–400)
RDW: 15.4 % (ref 11.1–15.7)

## 2012-04-22 LAB — FERRITIN: Ferritin: 84 ng/mL (ref 10–291)

## 2012-04-22 LAB — BASIC METABOLIC PANEL
CO2: 23 mEq/L (ref 19–32)
Chloride: 107 mEq/L (ref 96–112)
Creatinine, Ser: 0.87 mg/dL (ref 0.50–1.10)

## 2012-04-22 LAB — IRON AND TIBC: %SAT: 24 % (ref 20–55)

## 2012-04-22 MED ORDER — CYANOCOBALAMIN 1000 MCG/ML IJ SOLN
1000.0000 ug | Freq: Once | INTRAMUSCULAR | Status: AC
  Administered 2012-04-22: 1000 ug via INTRAMUSCULAR

## 2012-04-22 NOTE — Progress Notes (Signed)
This office note has been dictated.

## 2012-04-23 NOTE — Progress Notes (Signed)
DIAGNOSES: 1. Idiopathic aortic thrombosis. 2. Pernicious anemia. 3. Recurrent iron deficiency anemia.  CURRENT THERAPY: 1. Xarelto 20 mg p.o. daily. 2. IV iron as indicated, the patient last received on September 23. 3. Vitamin B12 q.month.  INTERIM HISTORY:  Kari Hahn comes in for followup.  She did go ahead and have her gallbladder taken out.  This was done laparoscopically.  Dr. Zella Richer did this on October 4.  There were no complications.  The pathology report did not show anything that was malignant.  She is having some postoperative discomfort in the abdomen.  The patient is having some right-sided chest discomfort.  There is no dyspnea.  She has had no cough.  She is also complaining of some fullness in the right leg.  She has had no fevers, sweats or chills.  PHYSICAL EXAMINATION:  General:  This is a well-developed, well- nourished white female in no obvious distress.  Vital signs:  97.8, pulse 64, respiratory rate 18, blood pressure 100/60.  Weight is 213. Head and neck:  Showed a normocephalic, atraumatic skull.  There are no ocular or oral lesions.  There are no palpable cervical or supraclavicular lymph nodes.  Lungs:  Clear bilaterally.  There are no rales, wheezes or rhonchi.  Cardiac:  Regular rate and rhythm with a normal S1, S2.  There are no murmurs, rubs or bruits.  Abdomen:  Soft. She is mildly obese.  She has well-healed laparoscopy scars.  There is a slight tenderness in the right upper quadrant.  There is no palpable hepatomegaly.  Extremities:  Show no clubbing, cyanosis or edema. Neurological:  Shows no focal neurological deficits.  LABORATORY STUDIES:  White cell count is 13.6, hemoglobin 12.2, hematocrit 37.4, platelet count is 529.  IMPRESSION:  Kari Hahn is a 41 year old white female with idiopathic aortic thrombosis.  She does have some thrombocytosis which is more reactive than anything else.  We will go ahead and give her B12 today.  I  doubt that needs iron.  She just got iron about a month ago.  We will plan to get her back in another month for followup.    ______________________________ Volanda Napoleon, M.D. PRE/MEDQ  D:  04/22/2012  T:  04/23/2012  Job:  [id number]

## 2012-05-20 ENCOUNTER — Other Ambulatory Visit (HOSPITAL_BASED_OUTPATIENT_CLINIC_OR_DEPARTMENT_OTHER): Payer: PRIVATE HEALTH INSURANCE | Admitting: Lab

## 2012-05-20 ENCOUNTER — Ambulatory Visit (HOSPITAL_BASED_OUTPATIENT_CLINIC_OR_DEPARTMENT_OTHER): Payer: Medicaid Other | Admitting: Medical

## 2012-05-20 ENCOUNTER — Ambulatory Visit (HOSPITAL_BASED_OUTPATIENT_CLINIC_OR_DEPARTMENT_OTHER): Payer: PRIVATE HEALTH INSURANCE

## 2012-05-20 VITALS — BP 117/60 | HR 66 | Temp 97.6°F | Resp 18 | Ht 64.0 in | Wt 213.0 lb

## 2012-05-20 DIAGNOSIS — I7411 Embolism and thrombosis of thoracic aorta: Secondary | ICD-10-CM

## 2012-05-20 DIAGNOSIS — D510 Vitamin B12 deficiency anemia due to intrinsic factor deficiency: Secondary | ICD-10-CM

## 2012-05-20 DIAGNOSIS — I7410 Embolism and thrombosis of unspecified parts of aorta: Secondary | ICD-10-CM

## 2012-05-20 DIAGNOSIS — D509 Iron deficiency anemia, unspecified: Secondary | ICD-10-CM

## 2012-05-20 DIAGNOSIS — E538 Deficiency of other specified B group vitamins: Secondary | ICD-10-CM

## 2012-05-20 LAB — CBC WITH DIFFERENTIAL (CANCER CENTER ONLY)
BASO#: 0.1 10*3/uL (ref 0.0–0.2)
EOS%: 2.5 % (ref 0.0–7.0)
HCT: 37.5 % (ref 34.8–46.6)
HGB: 12.2 g/dL (ref 11.6–15.9)
LYMPH%: 16.6 % (ref 14.0–48.0)
MCH: 33.1 pg (ref 26.0–34.0)
MCHC: 32.5 g/dL (ref 32.0–36.0)
MONO%: 5.6 % (ref 0.0–13.0)
NEUT#: 12 10*3/uL — ABNORMAL HIGH (ref 1.5–6.5)
NEUT%: 74.9 % (ref 39.6–80.0)

## 2012-05-20 LAB — FERRITIN: Ferritin: 25 ng/mL (ref 10–291)

## 2012-05-20 LAB — TECHNOLOGIST REVIEW CHCC SATELLITE

## 2012-05-20 MED ORDER — CYANOCOBALAMIN 1000 MCG/ML IJ SOLN
1000.0000 ug | Freq: Once | INTRAMUSCULAR | Status: AC
  Administered 2012-05-20: 1000 ug via INTRAMUSCULAR

## 2012-05-20 NOTE — Patient Instructions (Signed)

## 2012-05-20 NOTE — Progress Notes (Signed)
Diagnoses: #1 idiopathic, aortic thrombosis. #2, pernicious anemia. #3 recurrent iron deficiency anemia.  Current therapy: #1 Xarelto 20 mg by mouth daily. #2 IV iron as indicated.  The patient had IV iron.  Last on 03/18/2012 #3, vitamin B12 every month.   Interim history: Kari Hahn presents today for an office followup visit.  Overall, she, reports, that she's doing quite well.  Again, she did have a laparoscopic cholecystectomy back on October 4.  She has recovered quite nicely from that without any complications.  The pathology report did not show that anything was malignant.  The last, time, she received IV iron was back in September.  Her most recent iron studies on 04/22/2012, revealed an iron of 67, with 24% saturation and a ferritin of 83.  She remains on Xarelto 20 mg daily without any problems.  She does not report any obvious, or abnormal leading or bruising.  She's not reporting any type of chest pain, shortness of breath, or cough.  She denies any lower leg swelling.  She states she has a good appetite.  She denies any nausea, vomiting, diarrhea, constipation.  She denies any headaches, visual changes, or rashes.  She does not report any craving for ice.  She denies any fevers, chills, or night sweats.  She denies any palpable adenopathy.  Overall, she, reports, that she's been doing quite well without any new problems.  Review of Systems: Constitutional:Negative for malaise/fatigue, fever, chills, weight loss, diaphoresis, activity change, appetite change, and unexpected weight change.  HEENT: Negative for double vision, blurred vision, visual loss, ear pain, tinnitus, congestion, rhinorrhea, epistaxis sore throat or sinus disease, oral pain/lesion, tongue soreness Respiratory: Negative for cough, chest tightness, shortness of breath, wheezing and stridor.  Cardiovascular: Negative for chest pain, palpitations, leg swelling, orthopnea, PND, DOE or claudication Gastrointestinal:  Negative for nausea, vomiting, abdominal pain, diarrhea, constipation, blood in stool, melena, hematochezia, abdominal distention, anal bleeding, rectal pain, anorexia and hematemesis.  Genitourinary: Negative for dysuria, frequency, hematuria,  Musculoskeletal: Negative for myalgias, back pain, joint swelling, arthralgias and gait problem.  Skin: Negative for rash, color change, pallor and wound.  Neurological:. Negative for dizziness/light-headedness, tremors, seizures, syncope, facial asymmetry, speech difficulty, weakness, numbness, headaches and paresthesias.  Hematological: Negative for adenopathy. Does not bruise/bleed easily.  Psychiatric/Behavioral:  Negative for depression, no loss of interest in normal activity or change in sleep pattern.   Physical Exam: This is a pleasant, 41 year old, well-developed, well-nourished, white female, in no obvious distress Vitals: Temperature 97.6 degrees, pulse 66, respirations 18, blood pressure 117/60, weight 213 pounds HEENT reveals a normocephalic, atraumatic skull, no scleral icterus, no oral lesions  Neck is supple without any cervical or supraclavicular adenopathy.  Lungs are clear to auscultation bilaterally. There are no wheezes, rales or rhonci Cardiac is regular rate and rhythm with a normal S1 and S2. There are no murmurs, rubs, or bruits.  Abdomen is soft with good bowel sounds, there is no palpable mass. There is no palpable hepatosplenomegaly. There is no palpable fluid wave.  Musculoskeletal no tenderness of the spine, ribs, or hips.  Extremities there are no clubbing, cyanosis, or edema.  Skin no petechia, purpura or ecchymosis Neurologic is nonfocal.  Laboratory Data: White count 16.1, hemoglobin 12.2, hematocrit 37.5.  Platelets 623,000  Current Outpatient Prescriptions on File Prior to Visit  Medication Sig Dispense Refill  . ALPRAZolam (XANAX) 0.5 MG tablet Take 1 tablet (0.5 mg total) by mouth at bedtime as needed for sleep.   30 tablet  1  . amLODipine-valsartan (EXFORGE) 5-160 MG per tablet Take 1 tablet by mouth daily.        . Ascorbic Acid (VITAMIN C) 1000 MG tablet Take 1,000 mg by mouth daily.      . B Complex-C (B-COMPLEX WITH VITAMIN C) tablet Take 1 tablet by mouth daily.        . cetirizine (ZYRTEC) 10 MG tablet Take 10 mg by mouth daily.        . cholecalciferol (VITAMIN D) 1000 UNITS tablet Take 2,000 Units by mouth daily.       . folic acid (FOLVITE) 1 MG tablet Take 1 mg by mouth daily.        Marland Kitchen gabapentin (NEURONTIN) 600 MG tablet Take 600 mg by mouth 3 (three) times daily.        Marland Kitchen HYDROcodone-acetaminophen (LORCET) 10-650 MG per tablet Take 1 tablet by mouth 3 (three) times daily as needed for pain.  90 tablet  0  . mesalamine (PENTASA) 500 MG CR capsule Take 1,000 mg by mouth 4 (four) times daily. Antiinflammatory drug /  Ulcerative colitis .      . potassium chloride (KLOR-CON) 10 MEQ CR tablet Take 10 mEq by mouth daily.        . promethazine (PHENERGAN) 25 MG tablet Take 25 mg by mouth every 6 (six) hours as needed.      . Rivaroxaban (XARELTO) 20 MG TABS Take 20 mg by mouth daily.      Marland Kitchen zinc gluconate 50 MG tablet Take 50 mg by mouth daily.       Assessment/Plan: This is a pleasant, 41 year old, white female, with the following issues:  #1 idiopathic aortic thrombosis.  She does have some thrombocytosis, which is more reactive than anything else.  She will remain on Xarelto 20 mg daily.  #2 iron deficiency anemia.  She just had IV iron.  Back in September.  I doubt she will need IV iron any time soon.  We will continue to monitor her blood work  #3 vitamin B12 deficiency.  She will go ahead and get a B12 injection today.  #4.  Followup.  [name] will follow back up with [place] in one month, but before then should there be questions or concerns.

## 2012-05-22 ENCOUNTER — Other Ambulatory Visit: Payer: Self-pay | Admitting: *Deleted

## 2012-05-22 ENCOUNTER — Telehealth: Payer: Self-pay | Admitting: *Deleted

## 2012-05-22 DIAGNOSIS — D509 Iron deficiency anemia, unspecified: Secondary | ICD-10-CM

## 2012-05-22 NOTE — Telephone Encounter (Addendum)
Message copied by Mirian Capuchin on Wed May 22, 2012  2:29 PM ------      Message from: Josph Macho      Created: Tue May 21, 2012  7:34 AM       Call her and tell her that her iron is low again. We need another dose of Feraheme at 1020 mg. Please set this up for 1-2 weeks. Thanks. Cindee Lame This message given to pt and then sent to scheduler to schedule

## 2012-05-22 NOTE — Telephone Encounter (Signed)
Pt aware of 12-3 appointment

## 2012-05-28 ENCOUNTER — Ambulatory Visit (HOSPITAL_BASED_OUTPATIENT_CLINIC_OR_DEPARTMENT_OTHER): Payer: PRIVATE HEALTH INSURANCE

## 2012-05-28 DIAGNOSIS — D509 Iron deficiency anemia, unspecified: Secondary | ICD-10-CM

## 2012-05-28 MED ORDER — SODIUM CHLORIDE 0.9 % IV SOLN
1020.0000 mg | Freq: Once | INTRAVENOUS | Status: AC
  Administered 2012-05-28: 1020 mg via INTRAVENOUS
  Filled 2012-05-28: qty 34

## 2012-05-28 MED ORDER — SODIUM CHLORIDE 0.9 % IV SOLN
INTRAVENOUS | Status: DC
  Administered 2012-05-28: 15:00:00 via INTRAVENOUS

## 2012-05-28 NOTE — Patient Instructions (Signed)
Ferumoxytol injection What is this medicine? FERUMOXYTOL is an iron complex. Iron is used to make healthy red blood cells, which carry oxygen and nutrients throughout the body. This medicine is used to treat iron deficiency anemia in people with chronic kidney disease. This medicine may be used for other purposes; ask your health care provider or pharmacist if you have questions. What should I tell my health care provider before I take this medicine? They need to know if you have any of these conditions: -anemia not caused by low iron levels -high levels of iron in the blood -magnetic resonance imaging (MRI) test scheduled -an unusual or allergic reaction to iron, other medicines, foods, dyes, or preservatives -pregnant or trying to get pregnant -breast-feeding How should I use this medicine? This medicine is for infusion into a vein. It is given by a health care professional in a hospital or clinic setting. Talk to your pediatrician regarding the use of this medicine in children. Special care may be needed. Overdosage: If you think you've taken too much of this medicine contact a poison control center or emergency room at once. Overdosage: If you think you have taken too much of this medicine contact a poison control center or emergency room at once. NOTE: This medicine is only for you. Do not share this medicine with others. What if I miss a dose? It is important not to miss your dose. Call your doctor or health care professional if you are unable to keep an appointment. What may interact with this medicine? This medicine may interact with the following medications: -other iron products This list may not describe all possible interactions. Give your health care provider a list of all the medicines, herbs, non-prescription drugs, or dietary supplements you use. Also tell them if you smoke, drink alcohol, or use illegal drugs. Some items may interact with your medicine. What should I watch  for while using this medicine? Visit your doctor or healthcare professional regularly. Tell your doctor or healthcare professional if your symptoms do not start to get better or if they get worse. You may need blood work done while you are taking this medicine. You may need to follow a special diet. Talk to your doctor. Foods that contain iron include: whole grains/cereals, dried fruits, beans, or peas, leafy green vegetables, and organ meats (liver, kidney). What side effects may I notice from receiving this medicine? Side effects that you should report to your doctor or health care professional as soon as possible: -allergic reactions like skin rash, itching or hives, swelling of the face, lips, or tongue -breathing problems -changes in blood pressure -feeling faint or lightheaded, falls -fever or chills -flushing, sweating, or hot feelings -swelling of the ankles or feet Side effects that usually do not require medical attention (Report these to your doctor or health care professional if they continue or are bothersome.): -diarrhea -headache -nausea, vomiting -stomach pain This list may not describe all possible side effects. Call your doctor for medical advice about side effects. You may report side effects to FDA at 1-800-FDA-1088. Where should I keep my medicine? This drug is given in a hospital or clinic and will not be stored at home. NOTE: This sheet is a summary. It may not cover all possible information. If you have questions about this medicine, talk to your doctor, pharmacist, or health care provider.  2012, [employer]. (03/04/2008 9:48:25 PM) 

## 2012-06-17 ENCOUNTER — Other Ambulatory Visit (HOSPITAL_BASED_OUTPATIENT_CLINIC_OR_DEPARTMENT_OTHER): Payer: PRIVATE HEALTH INSURANCE | Admitting: Lab

## 2012-06-17 ENCOUNTER — Ambulatory Visit (HOSPITAL_BASED_OUTPATIENT_CLINIC_OR_DEPARTMENT_OTHER): Payer: PRIVATE HEALTH INSURANCE

## 2012-06-17 ENCOUNTER — Ambulatory Visit (HOSPITAL_BASED_OUTPATIENT_CLINIC_OR_DEPARTMENT_OTHER): Payer: PRIVATE HEALTH INSURANCE | Admitting: Hematology & Oncology

## 2012-06-17 VITALS — BP 96/46 | HR 79 | Temp 98.1°F | Resp 18 | Ht 64.0 in | Wt 208.0 lb

## 2012-06-17 DIAGNOSIS — D509 Iron deficiency anemia, unspecified: Secondary | ICD-10-CM

## 2012-06-17 DIAGNOSIS — D510 Vitamin B12 deficiency anemia due to intrinsic factor deficiency: Secondary | ICD-10-CM

## 2012-06-17 DIAGNOSIS — Z86718 Personal history of other venous thrombosis and embolism: Secondary | ICD-10-CM

## 2012-06-17 DIAGNOSIS — K589 Irritable bowel syndrome without diarrhea: Secondary | ICD-10-CM

## 2012-06-17 LAB — IRON AND TIBC
%SAT: 19 % — ABNORMAL LOW (ref 20–55)
Iron: 57 ug/dL (ref 42–145)
TIBC: 295 ug/dL (ref 250–470)
UIBC: 238 ug/dL (ref 125–400)

## 2012-06-17 LAB — CHCC SATELLITE - SMEAR

## 2012-06-17 LAB — CBC WITH DIFFERENTIAL (CANCER CENTER ONLY)
BASO#: 0.1 10*3/uL (ref 0.0–0.2)
Eosinophils Absolute: 0.4 10*3/uL (ref 0.0–0.5)
HGB: 13.6 g/dL (ref 11.6–15.9)
LYMPH#: 2.1 10*3/uL (ref 0.9–3.3)
NEUT#: 8.7 10*3/uL — ABNORMAL HIGH (ref 1.5–6.5)
RBC: 4.1 10*6/uL (ref 3.70–5.32)
WBC: 12.1 10*3/uL — ABNORMAL HIGH (ref 3.9–10.0)

## 2012-06-17 MED ORDER — CYANOCOBALAMIN 1000 MCG/ML IJ SOLN
1000.0000 ug | Freq: Once | INTRAMUSCULAR | Status: AC
  Administered 2012-06-17: 1000 ug via INTRAMUSCULAR

## 2012-06-17 MED ORDER — SACCHAROMYCES BOULARDII 250 MG PO CAPS: 250.0000 mg | ORAL_CAPSULE | Freq: Two times a day (BID) | ORAL | Status: DC

## 2012-06-17 NOTE — Progress Notes (Signed)
This office note has been dictated.

## 2012-06-17 NOTE — Patient Instructions (Signed)

## 2012-06-20 NOTE — Progress Notes (Signed)
CC:   Kari Dykes, MD, Fax [phone]  DIAGNOSES: 1. Idiopathic aortic thrombosis. 2. Recurrent iron deficiency anemia. 3. Pernicious anemia.  CURRENT THERAPY: 1. Xarelto 20 mg p.o. daily. 2. Vitamin B12 1 mg IM monthly. 3. IV iron as indicated.  INTERIM HISTORY:  Ms. Augusta comes in for a followup.  She is doing fairly well.  She had her gallbladder taken out 2-1/2 months ago.  She has gotten through this fairly nicely.  She has done well on the Xarelto.  It will be, I think, 3 years in the early February, that she had this idiopathic thrombosis.  She always does well with iron.  She said she is trying to stop smoking. She is doing pretty well with this.  Her last iron was given back in early December.  She is also doing well with the B12 shots.  PHYSICAL EXAMINATION:  This is a well-developed, well-nourished white female in no obvious distress.  Vital signs:  Temperature of 98.1, pulse 79, respiratory rate 18, blood pressure 96/51.  Weight is 208.  Head and neck:  Normocephalic, atraumatic skull.  There are no ocular or oral lesions.  There are no palpable cervical or supraclavicular lymph nodes. Lungs:  Clear bilaterally.  Cardiac:  Regular rate and rhythm with a normal S1 and S2.  There are no murmurs, rubs or bruits.  Abdomen:  Soft with good bowel sounds.  There is no palpable abdominal mass.  Her laparoscopy scars from her cholecystectomy are well-healed.  She has no fluid wave.  There may be a little bit of a abdominal wall/ventral wall hernia.  Extremities:  No clubbing, cyanosis or edema.  LABORATORY STUDIES:  White cell count is 12, hemoglobin 13.6, hematocrit 41.2, platelet count 456.  IMPRESSION:  Ms. Griffith is a 41 year old white female with idiopathic aortic thrombosis.  She presented back in I think February of 2011.  She is doing well from my point of view with this.  I believe that we can probably try to get her off anticoagulation when we see her back.   Again, that will be 3 years.  All her tests have always come back negative.  Again, she is doing well with the Xarelto.  I think 3 years of anticoagulation is appropriate for her.  She will come in monthly for B12 shots.    ______________________________ Volanda Napoleon, M.D. PRE/MEDQ  D:  06/17/2012  T:  06/18/2012  Job:  [id number]

## 2012-06-28 ENCOUNTER — Other Ambulatory Visit: Payer: Self-pay | Admitting: *Deleted

## 2012-06-28 DIAGNOSIS — I7410 Embolism and thrombosis of unspecified parts of aorta: Secondary | ICD-10-CM

## 2012-06-28 DIAGNOSIS — D510 Vitamin B12 deficiency anemia due to intrinsic factor deficiency: Secondary | ICD-10-CM

## 2012-06-28 DIAGNOSIS — D509 Iron deficiency anemia, unspecified: Secondary | ICD-10-CM

## 2012-06-28 MED ORDER — RIVAROXABAN 20 MG PO TABS: 20.0000 mg | ORAL_TABLET | Freq: Every day | ORAL | Status: DC

## 2012-06-28 NOTE — Telephone Encounter (Signed)
Received a refill request for Xarelto 20 mg. Pt is to stay on it until March 2014 per Dr Gustavo Lah notes. Sent via e-rx to [facility].

## 2012-07-15 ENCOUNTER — Ambulatory Visit (HOSPITAL_BASED_OUTPATIENT_CLINIC_OR_DEPARTMENT_OTHER): Payer: BC Managed Care – PPO

## 2012-07-15 VITALS — BP 122/73 | HR 88 | Temp 97.4°F | Resp 18

## 2012-07-15 DIAGNOSIS — D510 Vitamin B12 deficiency anemia due to intrinsic factor deficiency: Secondary | ICD-10-CM

## 2012-07-15 MED ORDER — CYANOCOBALAMIN 1000 MCG/ML IJ SOLN
1000.0000 ug | Freq: Once | INTRAMUSCULAR | Status: AC
  Administered 2012-07-15: 1000 ug via INTRAMUSCULAR

## 2012-07-15 NOTE — Progress Notes (Signed)
Instructed patient to see PCP regarding left hand/arm pain and numbness fingers. Kari Hahn, [name] [employer]

## 2012-07-15 NOTE — Patient Instructions (Signed)

## 2012-08-12 ENCOUNTER — Telehealth: Payer: Self-pay | Admitting: Hematology & Oncology

## 2012-08-12 ENCOUNTER — Ambulatory Visit: Payer: PRIVATE HEALTH INSURANCE

## 2012-08-12 NOTE — Telephone Encounter (Signed)
Patient called and cx 08/12/12 apt.  She did not want to resch

## 2012-09-16 ENCOUNTER — Ambulatory Visit: Payer: PRIVATE HEALTH INSURANCE | Admitting: Medical

## 2012-09-16 ENCOUNTER — Ambulatory Visit: Payer: PRIVATE HEALTH INSURANCE

## 2012-09-16 ENCOUNTER — Telehealth: Payer: Self-pay | Admitting: Hematology & Oncology

## 2012-09-16 ENCOUNTER — Other Ambulatory Visit: Payer: PRIVATE HEALTH INSURANCE | Admitting: Lab

## 2012-09-16 NOTE — Telephone Encounter (Signed)
Patient called and cx 09/16/12 apt and resch for 09/25/12.  Kari Hahn was notified of cx apt

## 2012-09-25 ENCOUNTER — Ambulatory Visit (HOSPITAL_BASED_OUTPATIENT_CLINIC_OR_DEPARTMENT_OTHER): Payer: BC Managed Care – PPO | Admitting: Medical

## 2012-09-25 ENCOUNTER — Ambulatory Visit (HOSPITAL_BASED_OUTPATIENT_CLINIC_OR_DEPARTMENT_OTHER): Payer: BC Managed Care – PPO

## 2012-09-25 ENCOUNTER — Other Ambulatory Visit (HOSPITAL_BASED_OUTPATIENT_CLINIC_OR_DEPARTMENT_OTHER): Payer: BC Managed Care – PPO | Admitting: Lab

## 2012-09-25 ENCOUNTER — Telehealth: Payer: Self-pay | Admitting: Hematology & Oncology

## 2012-09-25 VITALS — BP 109/50 | HR 95 | Temp 98.1°F | Resp 16 | Ht 64.0 in | Wt 207.0 lb

## 2012-09-25 DIAGNOSIS — I7410 Embolism and thrombosis of unspecified parts of aorta: Secondary | ICD-10-CM

## 2012-09-25 DIAGNOSIS — D509 Iron deficiency anemia, unspecified: Secondary | ICD-10-CM

## 2012-09-25 DIAGNOSIS — E538 Deficiency of other specified B group vitamins: Secondary | ICD-10-CM

## 2012-09-25 DIAGNOSIS — I7411 Embolism and thrombosis of thoracic aorta: Secondary | ICD-10-CM

## 2012-09-25 DIAGNOSIS — K589 Irritable bowel syndrome without diarrhea: Secondary | ICD-10-CM

## 2012-09-25 DIAGNOSIS — D510 Vitamin B12 deficiency anemia due to intrinsic factor deficiency: Secondary | ICD-10-CM

## 2012-09-25 LAB — CBC WITH DIFFERENTIAL (CANCER CENTER ONLY)
BASO%: 0.4 % (ref 0.0–2.0)
HCT: 34.1 % — ABNORMAL LOW (ref 34.8–46.6)
LYMPH%: 16.4 % (ref 14.0–48.0)
MCH: 28.6 pg (ref 26.0–34.0)
MCV: 91 fL (ref 81–101)
MONO#: 0.6 10*3/uL (ref 0.1–0.9)
NEUT%: 76.6 % (ref 39.6–80.0)
RDW: 15.1 % (ref 11.1–15.7)
WBC: 13.5 10*3/uL — ABNORMAL HIGH (ref 3.9–10.0)

## 2012-09-25 LAB — CHCC SATELLITE - SMEAR

## 2012-09-25 MED ORDER — CYANOCOBALAMIN 1000 MCG/ML IJ SOLN
1000.0000 ug | Freq: Once | INTRAMUSCULAR | Status: AC
  Administered 2012-09-25: 1000 ug via INTRAMUSCULAR

## 2012-09-25 NOTE — Patient Instructions (Signed)

## 2012-09-25 NOTE — Telephone Encounter (Signed)
Pt is going to PCP for B12

## 2012-09-25 NOTE — Progress Notes (Signed)
Diagnoses: #1 idiopathic, aortic thrombosis. #2, pernicious anemia. #3 recurrent iron deficiency anemia.  Current therapy: #1 Xarelto 20 mg by mouth daily. #2 IV iron as indicated.  The patient had IV iron.  Last on 05/28/2012 #3, vitamin B12 every month.   Interim history: Ms. Trigueros presents today for an office followup visit.  Overall, she, reports, that she's doing quite well.  She does say that she has some intermittent right-sided chest pain.  She does followup with her primary care physician at [facility].  She is having extensive workup for the, chest, pain.  The last, time, she received IV iron was back in December.  Her most recent iron studies on 06/17/2012, revealed an iron of 57, with 19% saturation and a ferritin of 199.  She remains on Xarelto 20 mg daily without any problems.  She has now been on Xarelto for 3 years.  All of her workup has been negative.  At this point, she can go ahead and discontinue the Xarelto.  She states, that she is concerned about coming off of this medication.  She is afraid that she will develop another clot.  I did inform her that it is okay to stay on it.  However, we feel that 3 years is sufficient enough for her to come off of Xarelto.  At this point, she prefers to stay on it.  She does not report any obvious, or abnormal leading or bruising.  She's not reporting any type of, shortness of breath, or cough.  She denies any lower leg swelling.  She states she has a good appetite.  She denies any nausea, vomiting, diarrhea, constipation.  She denies any headaches, visual changes, or rashes.  She does not report any craving for ice.  She denies any fevers, chills, or night sweats.  She denies any palpable adenopathy.  Overall, she, reports, that she's been doing quite well without any new problems.  Review of Systems: Constitutional:Negative for malaise/fatigue, fever, chills, weight loss, diaphoresis, activity change, appetite change, and unexpected  weight change.  HEENT: Negative for double vision, blurred vision, visual loss, ear pain, tinnitus, congestion, rhinorrhea, epistaxis sore throat or sinus disease, oral pain/lesion, tongue soreness Respiratory: Negative for cough, chest tightness, shortness of breath, wheezing and stridor.  Cardiovascular: Negative for chest pain, palpitations, leg swelling, orthopnea, PND, DOE or claudication Gastrointestinal: Negative for nausea, vomiting, abdominal pain, diarrhea, constipation, blood in stool, melena, hematochezia, abdominal distention, anal bleeding, rectal pain, anorexia and hematemesis.  Genitourinary: Negative for dysuria, frequency, hematuria,  Musculoskeletal: Negative for myalgias, back pain, joint swelling, arthralgias and gait problem.  Skin: Negative for rash, color change, pallor and wound.  Neurological:. Negative for dizziness/light-headedness, tremors, seizures, syncope, facial asymmetry, speech difficulty, weakness, numbness, headaches and paresthesias.  Hematological: Negative for adenopathy. Does not bruise/bleed easily.  Psychiatric/Behavioral:  Negative for depression, no loss of interest in normal activity or change in sleep pattern.   Physical Exam: This is a pleasant, 42 year old, well-developed, well-nourished, white female, in no obvious distress Vitals: Temperature 98.1 degrees, pulse 95, respirations 16, blood pressure 106/42.  Weight 207 pounds HEENT reveals a normocephalic, atraumatic skull, no scleral icterus, no oral lesions  Neck is supple without any cervical or supraclavicular adenopathy.  Lungs are clear to auscultation bilaterally. There are no wheezes, rales or rhonci Cardiac is regular rate and rhythm with a normal S1 and S2. There are no murmurs, rubs, or bruits.  Abdomen is soft with good bowel sounds, there is no palpable mass. There  is no palpable hepatosplenomegaly. There is no palpable fluid wave.  Musculoskeletal no tenderness of the spine, ribs, or  hips.  Extremities there are no clubbing, cyanosis, or edema.  Skin no petechia, purpura or ecchymosis Neurologic is nonfocal.  Laboratory Data: White count 13.5, hemoglobin 10.7, hematocrit 34.1, platelets 710,000  Current Outpatient Prescriptions on File Prior to Visit  Medication Sig Dispense Refill  . ALPRAZolam (XANAX) 0.5 MG tablet Take 1 tablet (0.5 mg total) by mouth at bedtime as needed for sleep.  30 tablet  1  . amLODipine-valsartan (EXFORGE) 5-160 MG per tablet Take 1 tablet by mouth daily.        . Ascorbic Acid (VITAMIN C) 1000 MG tablet Take 1,000 mg by mouth daily.      . B Complex-C (B-COMPLEX WITH VITAMIN C) tablet Take 1 tablet by mouth daily.        . cetirizine (ZYRTEC) 10 MG tablet Take 10 mg by mouth daily.        . cholecalciferol (VITAMIN D) 1000 UNITS tablet Take 2,000 Units by mouth daily.       . folic acid (FOLVITE) 1 MG tablet Take 1 mg by mouth daily.        Marland Kitchen gabapentin (NEURONTIN) 600 MG tablet Take 600 mg by mouth 3 (three) times daily.        Marland Kitchen HYDROcodone-acetaminophen (LORCET) 10-650 MG per tablet Take 1 tablet by mouth 3 (three) times daily as needed for pain.  90 tablet  0  . mesalamine (PENTASA) 500 MG CR capsule Take 1,000 mg by mouth 4 (four) times daily. Antiinflammatory drug /  Ulcerative colitis .      . potassium chloride (KLOR-CON) 10 MEQ CR tablet Take 10 mEq by mouth daily.        . promethazine (PHENERGAN) 25 MG tablet Take 25 mg by mouth every 6 (six) hours as needed.      . Rivaroxaban (XARELTO) 20 MG TABS Take 20 mg by mouth daily.      Marland Kitchen zinc gluconate 50 MG tablet Take 50 mg by mouth daily.       Assessment/Plan: This is a pleasant, 42 year old, white female, with the following issues:  #1 idiopathic aortic thrombosis.  She is now been on anticoagulation for 3 years.  All her tests have always come back negative.  I did discuss discontinuing the Xarelto, however, she prefers to stay on this medication for the time being.  #2 iron  deficiency anemia.  We are checking an iron panel on her.  Her hemoglobin is a little bit on the low side.  She may need another dose of IV iron.  #3 vitamin B12 deficiency.  She will go ahead and get a B12 injection today.  #4.  Followup.  Ms. Fore will follow back up with [place] in three months, but before then should there be questions or concerns.

## 2012-09-26 LAB — IRON AND TIBC: %SAT: 6 % — ABNORMAL LOW (ref 20–55)

## 2012-09-26 LAB — D-DIMER, QUANTITATIVE: D-Dimer, Quant: 0.27 ug/mL-FEU (ref 0.00–0.48)

## 2012-09-26 LAB — VITAMIN B12: Vitamin B-12: 799 pg/mL (ref 211–911)

## 2012-09-27 ENCOUNTER — Other Ambulatory Visit: Payer: Self-pay | Admitting: Oncology

## 2012-09-27 ENCOUNTER — Telehealth: Payer: Self-pay | Admitting: Oncology

## 2012-09-27 DIAGNOSIS — D509 Iron deficiency anemia, unspecified: Secondary | ICD-10-CM

## 2012-09-27 NOTE — Telephone Encounter (Addendum)
Message copied by Lacie Draft on Fri Sep 27, 2012  2:23 PM ------      Message from: Arlan Organ R      Created: Thu Sep 26, 2012  6:30 PM       Call - iron is low again!!  Need Feraheme at 1020mg  x 1 dose!  Please set up!! Cindee Lame ------Spoke with patient and Raiford Noble, will schedule today.

## 2012-09-27 NOTE — Telephone Encounter (Signed)
Pt made 4-10 iron appointment

## 2012-10-03 ENCOUNTER — Ambulatory Visit: Payer: BC Managed Care – PPO

## 2012-10-04 ENCOUNTER — Telehealth: Payer: Self-pay | Admitting: Hematology & Oncology

## 2012-10-04 NOTE — Telephone Encounter (Signed)
Pt made 4-18 iron appointment from missed 4-10

## 2012-10-11 ENCOUNTER — Ambulatory Visit (HOSPITAL_BASED_OUTPATIENT_CLINIC_OR_DEPARTMENT_OTHER): Payer: BC Managed Care – PPO

## 2012-10-11 ENCOUNTER — Other Ambulatory Visit: Payer: Self-pay | Admitting: Oncology

## 2012-10-11 VITALS — BP 122/75 | HR 93 | Temp 97.5°F | Resp 16

## 2012-10-11 DIAGNOSIS — D509 Iron deficiency anemia, unspecified: Secondary | ICD-10-CM

## 2012-10-11 DIAGNOSIS — D510 Vitamin B12 deficiency anemia due to intrinsic factor deficiency: Secondary | ICD-10-CM

## 2012-10-11 DIAGNOSIS — I7410 Embolism and thrombosis of unspecified parts of aorta: Secondary | ICD-10-CM

## 2012-10-11 MED ORDER — RIVAROXABAN 20 MG PO TABS: 20.0000 mg | ORAL_TABLET | Freq: Every day | ORAL | Status: DC

## 2012-10-11 MED ORDER — SODIUM CHLORIDE 0.9 % IV SOLN
1020.0000 mg | Freq: Once | INTRAVENOUS | Status: AC
  Administered 2012-10-11: 1020 mg via INTRAVENOUS
  Filled 2012-10-11: qty 34

## 2012-10-11 MED ORDER — SODIUM CHLORIDE 0.9 % IV SOLN
Freq: Once | INTRAVENOUS | Status: AC
  Administered 2012-10-11: 14:00:00 via INTRAVENOUS

## 2012-10-11 NOTE — Patient Instructions (Signed)
Ferumoxytol injection What is this medicine? FERUMOXYTOL is an iron complex. Iron is used to make healthy red blood cells, which carry oxygen and nutrients throughout the body. This medicine is used to treat iron deficiency anemia in people with chronic kidney disease. This medicine may be used for other purposes; ask your health care provider or pharmacist if you have questions. What should I tell my health care provider before I take this medicine? They need to know if you have any of these conditions: -anemia not caused by low iron levels -high levels of iron in the blood -magnetic resonance imaging (MRI) test scheduled -an unusual or allergic reaction to iron, other medicines, foods, dyes, or preservatives -pregnant or trying to get pregnant -breast-feeding How should I use this medicine? This medicine is for infusion into a vein. It is given by a health care professional in a hospital or clinic setting. Talk to your pediatrician regarding the use of this medicine in children. Special care may be needed. Overdosage: If you think you've taken too much of this medicine contact a poison control center or emergency room at once. Overdosage: If you think you have taken too much of this medicine contact a poison control center or emergency room at once. NOTE: This medicine is only for you. Do not share this medicine with others. What if I miss a dose? It is important not to miss your dose. Call your doctor or health care professional if you are unable to keep an appointment. What may interact with this medicine? This medicine may interact with the following medications: -other iron products This list may not describe all possible interactions. Give your health care provider a list of all the medicines, herbs, non-prescription drugs, or dietary supplements you use. Also tell them if you smoke, drink alcohol, or use illegal drugs. Some items may interact with your medicine. What should I watch  for while using this medicine? Visit your doctor or healthcare professional regularly. Tell your doctor or healthcare professional if your symptoms do not start to get better or if they get worse. You may need blood work done while you are taking this medicine. You may need to follow a special diet. Talk to your doctor. Foods that contain iron include: whole grains/cereals, dried fruits, beans, or peas, leafy green vegetables, and organ meats (liver, kidney). What side effects may I notice from receiving this medicine? Side effects that you should report to your doctor or health care professional as soon as possible: -allergic reactions like skin rash, itching or hives, swelling of the face, lips, or tongue -breathing problems -changes in blood pressure -feeling faint or lightheaded, falls -fever or chills -flushing, sweating, or hot feelings -swelling of the ankles or feet Side effects that usually do not require medical attention (Report these to your doctor or health care professional if they continue or are bothersome.): -diarrhea -headache -nausea, vomiting -stomach pain This list may not describe all possible side effects. Call your doctor for medical advice about side effects. You may report side effects to FDA at 1-800-FDA-1088. Where should I keep my medicine? This drug is given in a hospital or clinic and will not be stored at home. NOTE: This sheet is a summary. It may not cover all possible information. If you have questions about this medicine, talk to your doctor, pharmacist, or health care provider.  2013, Elsevier/Gold Standard. (03/04/2008 9:48:25 PM)  

## 2012-11-30 DIAGNOSIS — K644 Residual hemorrhoidal skin tags: Secondary | ICD-10-CM | POA: Insufficient documentation

## 2012-11-30 DIAGNOSIS — K5090 Crohn's disease, unspecified, without complications: Secondary | ICD-10-CM | POA: Insufficient documentation

## 2012-11-30 DIAGNOSIS — N920 Excessive and frequent menstruation with regular cycle: Secondary | ICD-10-CM | POA: Insufficient documentation

## 2012-11-30 DIAGNOSIS — F329 Major depressive disorder, single episode, unspecified: Secondary | ICD-10-CM | POA: Insufficient documentation

## 2012-11-30 DIAGNOSIS — I749 Embolism and thrombosis of unspecified artery: Secondary | ICD-10-CM | POA: Insufficient documentation

## 2012-11-30 DIAGNOSIS — I10 Essential (primary) hypertension: Secondary | ICD-10-CM | POA: Insufficient documentation

## 2013-01-16 ENCOUNTER — Telehealth: Payer: Self-pay | Admitting: Hematology & Oncology

## 2013-01-16 NOTE — Telephone Encounter (Signed)
PT MOVED 7-31 TO 8-7

## 2013-01-23 ENCOUNTER — Ambulatory Visit: Payer: BC Managed Care – PPO | Admitting: Hematology & Oncology

## 2013-01-23 ENCOUNTER — Other Ambulatory Visit: Payer: BC Managed Care – PPO | Admitting: Lab

## 2013-01-30 ENCOUNTER — Other Ambulatory Visit (HOSPITAL_BASED_OUTPATIENT_CLINIC_OR_DEPARTMENT_OTHER): Payer: BC Managed Care – PPO | Admitting: Lab

## 2013-01-30 ENCOUNTER — Ambulatory Visit (HOSPITAL_BASED_OUTPATIENT_CLINIC_OR_DEPARTMENT_OTHER): Payer: BC Managed Care – PPO | Admitting: Hematology & Oncology

## 2013-01-30 VITALS — BP 94/40 | HR 80 | Resp 16 | Ht 64.0 in | Wt 210.0 lb

## 2013-01-30 DIAGNOSIS — D509 Iron deficiency anemia, unspecified: Secondary | ICD-10-CM

## 2013-01-30 DIAGNOSIS — Z7901 Long term (current) use of anticoagulants: Secondary | ICD-10-CM

## 2013-01-30 DIAGNOSIS — I7411 Embolism and thrombosis of thoracic aorta: Secondary | ICD-10-CM

## 2013-01-30 DIAGNOSIS — I7410 Embolism and thrombosis of unspecified parts of aorta: Secondary | ICD-10-CM

## 2013-01-30 DIAGNOSIS — D510 Vitamin B12 deficiency anemia due to intrinsic factor deficiency: Secondary | ICD-10-CM

## 2013-01-30 LAB — VITAMIN B12: Vitamin B-12: 1089 pg/mL — ABNORMAL HIGH (ref 211–911)

## 2013-01-30 LAB — CBC WITH DIFFERENTIAL (CANCER CENTER ONLY)
BASO%: 0.5 % (ref 0.0–2.0)
EOS%: 3 % (ref 0.0–7.0)
HCT: 32.4 % — ABNORMAL LOW (ref 34.8–46.6)
LYMPH#: 2.5 10*3/uL (ref 0.9–3.3)
MONO#: 0.8 10*3/uL (ref 0.1–0.9)
NEUT#: 11 10*3/uL — ABNORMAL HIGH (ref 1.5–6.5)
Platelets: 587 10*3/uL — ABNORMAL HIGH (ref 145–400)
RDW: 17.4 % — ABNORMAL HIGH (ref 11.1–15.7)
WBC: 14.8 10*3/uL — ABNORMAL HIGH (ref 3.9–10.0)

## 2013-01-30 LAB — IRON AND TIBC CHCC: Iron: 18 ug/dL — ABNORMAL LOW (ref 41–142)

## 2013-01-30 LAB — D-DIMER, QUANTITATIVE: D-Dimer, Quant: 0.33 ug/mL-FEU (ref 0.00–0.48)

## 2013-01-30 LAB — FERRITIN CHCC: Ferritin: 12 ng/ml (ref 9–269)

## 2013-01-30 MED ORDER — RIVAROXABAN 20 MG PO TABS: 20.0000 mg | ORAL_TABLET | Freq: Every day | ORAL | Status: DC

## 2013-01-30 NOTE — Progress Notes (Signed)
This office note has been dictated.

## 2013-01-31 NOTE — Progress Notes (Signed)
CC:   Patric Dykes, MD, Fax [phone]  DIAGNOSIS: 1. Aortic thrombosis-idiopathic. 2. Pernicious anemia, secondary to ulcerative colitis. 3. Recurrent iron-deficiency anemia secondary to ulcerative colitis.  CURRENT THERAPY: 1. Xarelto 20 mg p.o. daily. 2. Vitamin B12 1 mg IM monthly. 3. IV iron as indicated, patient last received in April 2014.  INTERIM HISTORY:  Kari Hahn comes in for followup.  She is feeling tired.  She hurt her back again. She is somewhat overweight.  This does not help her.  She has got no obvious bleeding.  She does have her monthly cycles, which she is on right now.  She has been getting her vitamin B12 monthly.  Again, she got her iron back in April.  She may need another dose of iron.  She has had no cough.  There has been no leg swelling.  There has been no rashes.  PHYSICAL EXAMINATION:  General:  This is an obese white female in no obvious distress.  Vital Signs:  Temperature of 98.2, pulse 85, respiratory rate 18, blood pressure 94/40.  Weight is 210 pounds.  Head and Neck:  Normocephalic, atraumatic skull.  There are no ocular or oral lesions.  There are no palpable cervical or supraclavicular lymph nodes. Lungs:  Clear bilaterally.  Cardiac:  Regular rate and rhythm with a normal S1 and S2.  There are no murmurs, rubs or bruits.  Abdomen: Soft.  She has good bowel sounds.  There is no fluid wave.  There is no palpable hepatosplenomegaly.  Extremities:  Show no clubbing, cyanosis or edema.  Skin:  No rashes, ecchymoses or petechia.  LABORATORY STUDIES:  White cell count is 14.8, hemoglobin 10, hematocrit 32.4, platelet count 587.  MCV is 92.  IMPRESSION:  [name] is a very charming 42 year old white female with multiple hematologic issues.  She had an idiopathic aortic thrombus. This was diagnosed in February 2011.  She has been on Xarelto.  This is lifelong.  She also has ulcerative colitis.  As such, she needs vitamin B12  and iron.  I am surprised that her hemoglobin is not better than it is.  We will see about trying to give her a dose of iron today along with B12.  I want to see her back in about 6 weeks' time.    ______________________________ Volanda Napoleon, M.D. PRE/MEDQ  D:  01/30/2013  T:  01/31/2013  Job:  [id number]

## 2013-02-05 ENCOUNTER — Telehealth: Payer: Self-pay | Admitting: Hematology & Oncology

## 2013-02-05 ENCOUNTER — Ambulatory Visit (HOSPITAL_BASED_OUTPATIENT_CLINIC_OR_DEPARTMENT_OTHER): Payer: BC Managed Care – PPO

## 2013-02-05 VITALS — BP 118/72 | HR 85 | Temp 97.6°F | Resp 20

## 2013-02-05 DIAGNOSIS — D509 Iron deficiency anemia, unspecified: Secondary | ICD-10-CM

## 2013-02-05 DIAGNOSIS — D510 Vitamin B12 deficiency anemia due to intrinsic factor deficiency: Secondary | ICD-10-CM

## 2013-02-05 MED ORDER — SODIUM CHLORIDE 0.9 % IV SOLN
Freq: Once | INTRAVENOUS | Status: AC
  Administered 2013-02-05: 12:00:00 via INTRAVENOUS

## 2013-02-05 MED ORDER — CYANOCOBALAMIN 1000 MCG/ML IJ SOLN
1000.0000 ug | Freq: Once | INTRAMUSCULAR | Status: AC
  Administered 2013-02-05: 1000 ug via INTRAMUSCULAR

## 2013-02-05 MED ORDER — SODIUM CHLORIDE 0.9 % IV SOLN
1020.0000 mg | Freq: Once | INTRAVENOUS | Status: AC
  Administered 2013-02-05: 1020 mg via INTRAVENOUS
  Filled 2013-02-05: qty 34

## 2013-02-05 NOTE — Telephone Encounter (Signed)
Patient called stating she needed to change apt time for today.  Spoke with Amy.  Per Amy to tell patient to come in at 11:30.

## 2013-02-05 NOTE — Patient Instructions (Signed)
Cyanocobalamin, Vitamin B12 injection What is this medicine? CYANOCOBALAMIN (sye an oh koe BAL a min) is a man made form of vitamin B12. Vitamin B12 is used in the growth of healthy blood cells, nerve cells, and proteins in the body. It also helps with the metabolism of fats and carbohydrates. This medicine is used to treat people who can not absorb vitamin B12. This medicine may be used for other purposes; ask your health care provider or pharmacist if you have questions. What should I tell my health care provider before I take this medicine? They need to know if you have any of these conditions: -kidney disease -Leber's disease -megaloblastic anemia -an unusual or allergic reaction to cyanocobalamin, cobalt, other medicines, foods, dyes, or preservatives -pregnant or trying to get pregnant -breast-feeding How should I use this medicine? This medicine is injected into a muscle or deeply under the skin. It is usually given by a health care professional in a clinic or doctor's office. However, your doctor may teach you how to inject yourself. Follow all instructions. Talk to your pediatrician regarding the use of this medicine in children. Special care may be needed. Overdosage: If you think you have taken too much of this medicine contact a poison control center or emergency room at once. NOTE: This medicine is only for you. Do not share this medicine with others. What if I miss a dose? If you are given your dose at a clinic or doctor's office, call to reschedule your appointment. If you give your own injections and you miss a dose, take it as soon as you can. If it is almost time for your next dose, take only that dose. Do not take double or extra doses. What may interact with this medicine? -colchicine -heavy alcohol intake This list may not describe all possible interactions. Give your health care provider a list of all the medicines, herbs, non-prescription drugs, or dietary supplements you  use. Also tell them if you smoke, drink alcohol, or use illegal drugs. Some items may interact with your medicine. What should I watch for while using this medicine? Visit your doctor or health care professional regularly. You may need blood work done while you are taking this medicine. You may need to follow a special diet. Talk to your doctor. Limit your alcohol intake and avoid smoking to get the best benefit. What side effects may I notice from receiving this medicine? Side effects that you should report to your doctor or health care professional as soon as possible: -allergic reactions like skin rash, itching or hives, swelling of the face, lips, or tongue -blue tint to skin -chest tightness, pain -difficulty breathing, wheezing -dizziness -red, swollen painful area on the leg Side effects that usually do not require medical attention (report to your doctor or health care professional if they continue or are bothersome): -diarrhea -headache This list may not describe all possible side effects. Call your doctor for medical advice about side effects. You may report side effects to FDA at 1-800-FDA-1088. Where should I keep my medicine? Keep out of the reach of children. Store at room temperature between 15 and 30 degrees C (59 and 85 degrees F). Protect from light. Throw away any unused medicine after the expiration date. NOTE: This sheet is a summary. It may not cover all possible information. If you have questions about this medicine, talk to your doctor, pharmacist, or health care provider.  2013, Elsevier/Gold Standard. (09/23/2007 10:10:20 PM) Ferumoxytol injection What is this medicine? FERUMOXYTOL   is an iron complex. Iron is used to make healthy red blood cells, which carry oxygen and nutrients throughout the body. This medicine is used to treat iron deficiency anemia in people with chronic kidney disease. This medicine may be used for other purposes; ask your health care provider  or pharmacist if you have questions. What should I tell my health care provider before I take this medicine? They need to know if you have any of these conditions: -anemia not caused by low iron levels -high levels of iron in the blood -magnetic resonance imaging (MRI) test scheduled -an unusual or allergic reaction to iron, other medicines, foods, dyes, or preservatives -pregnant or trying to get pregnant -breast-feeding How should I use this medicine? This medicine is for infusion into a vein. It is given by a health care professional in a hospital or clinic setting. Talk to your pediatrician regarding the use of this medicine in children. Special care may be needed. Overdosage: If you think you've taken too much of this medicine contact a poison control center or emergency room at once. Overdosage: If you think you have taken too much of this medicine contact a poison control center or emergency room at once. NOTE: This medicine is only for you. Do not share this medicine with others. What if I miss a dose? It is important not to miss your dose. Call your doctor or health care professional if you are unable to keep an appointment. What may interact with this medicine? This medicine may interact with the following medications: -other iron products This list may not describe all possible interactions. Give your health care provider a list of all the medicines, herbs, non-prescription drugs, or dietary supplements you use. Also tell them if you smoke, drink alcohol, or use illegal drugs. Some items may interact with your medicine. What should I watch for while using this medicine? Visit your doctor or healthcare professional regularly. Tell your doctor or healthcare professional if your symptoms do not start to get better or if they get worse. You may need blood work done while you are taking this medicine. You may need to follow a special diet. Talk to your doctor. Foods that contain iron  include: whole grains/cereals, dried fruits, beans, or peas, leafy green vegetables, and organ meats (liver, kidney). What side effects may I notice from receiving this medicine? Side effects that you should report to your doctor or health care professional as soon as possible: -allergic reactions like skin rash, itching or hives, swelling of the face, lips, or tongue -breathing problems -changes in blood pressure -feeling faint or lightheaded, falls -fever or chills -flushing, sweating, or hot feelings -swelling of the ankles or feet Side effects that usually do not require medical attention (Report these to your doctor or health care professional if they continue or are bothersome.): -diarrhea -headache -nausea, vomiting -stomach pain This list may not describe all possible side effects. Call your doctor for medical advice about side effects. You may report side effects to FDA at 1-800-FDA-1088. Where should I keep my medicine? This drug is given in a hospital or clinic and will not be stored at home. NOTE: This sheet is a summary. It may not cover all possible information. If you have questions about this medicine, talk to your doctor, pharmacist, or health care provider.  2013, Elsevier/Gold Standard. (03/04/2008 9:48:25 PM)  

## 2013-02-28 IMAGING — US US ABDOMEN COMPLETE
1 series · 13 of 25 positions shown · non-contrast
Comparison: CT scan 11/24/2011.

CLINICAL DATA: Abdominal pain.

COMPLETE ABDOMINAL ULTRASOUND

[Series 1: us abdomen complete · 0.35mm/px · 13 of 66 slices shown]
[im 1/66]
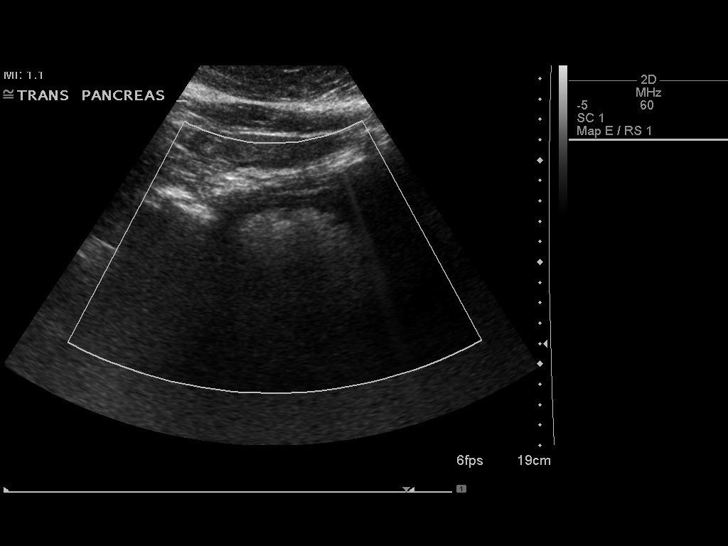
[im 6/66]
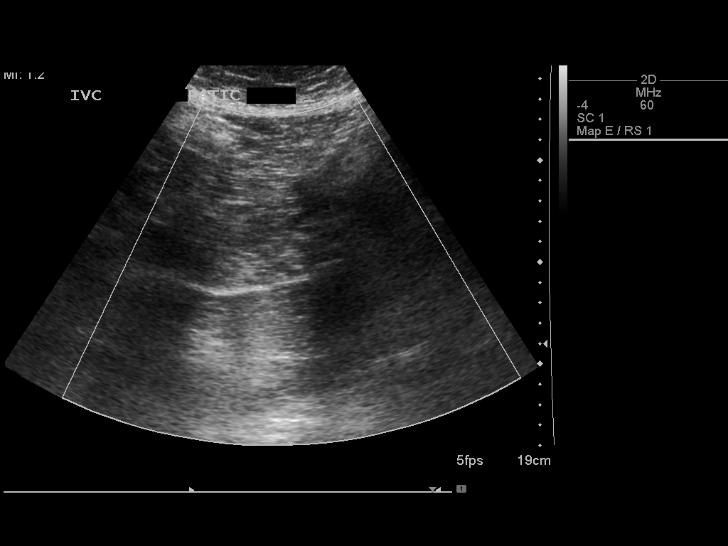
[im 11/66]
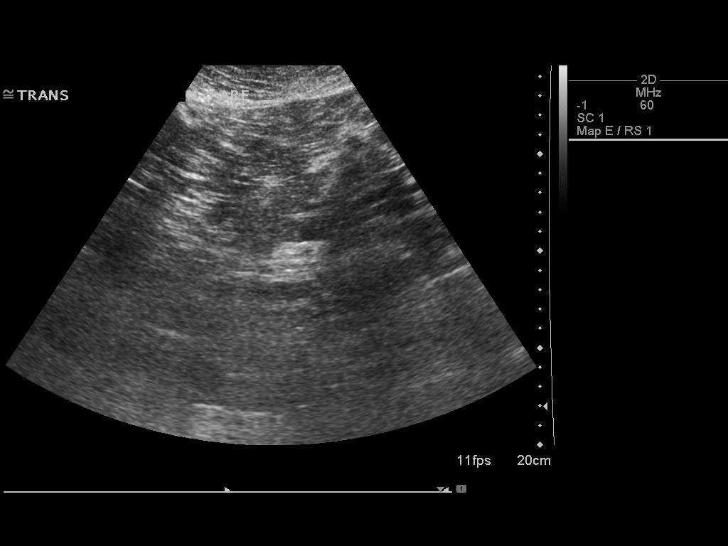
[im 17/66]
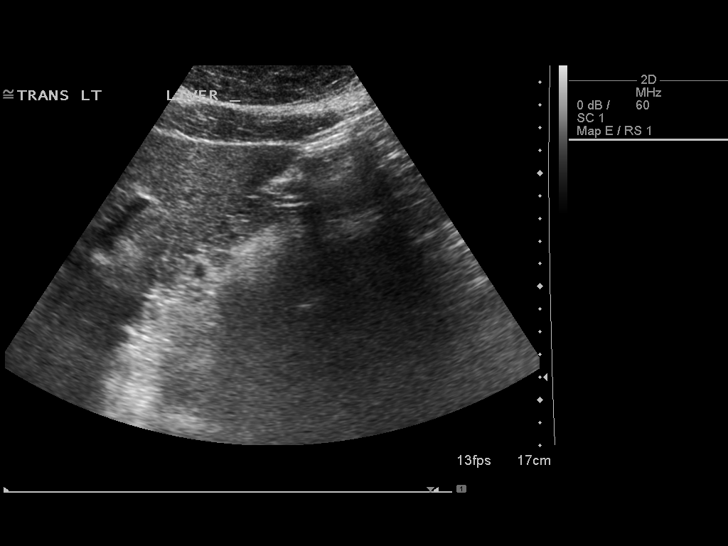
[im 22/66]
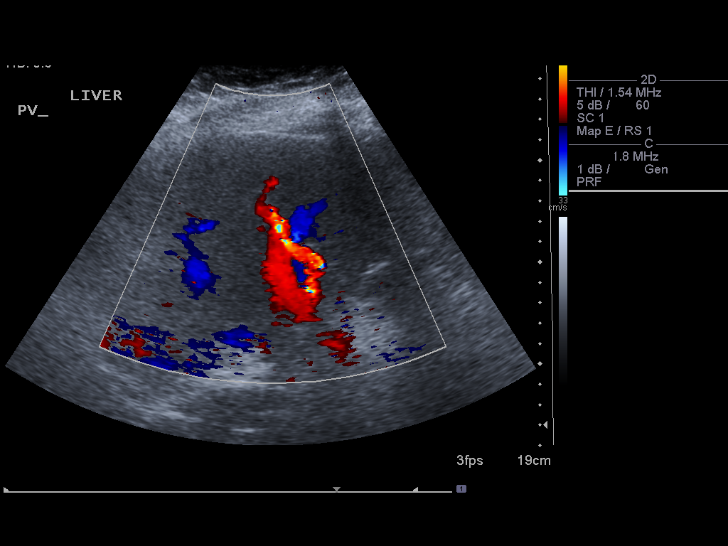
[im 28/66]
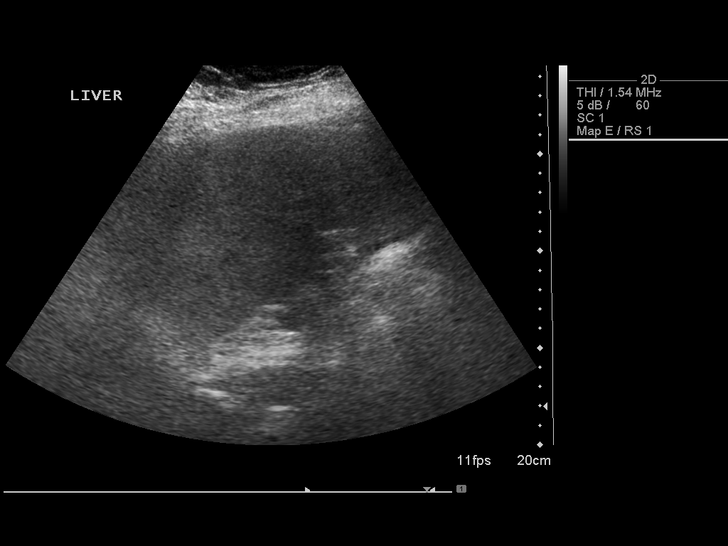
[im 33/66]
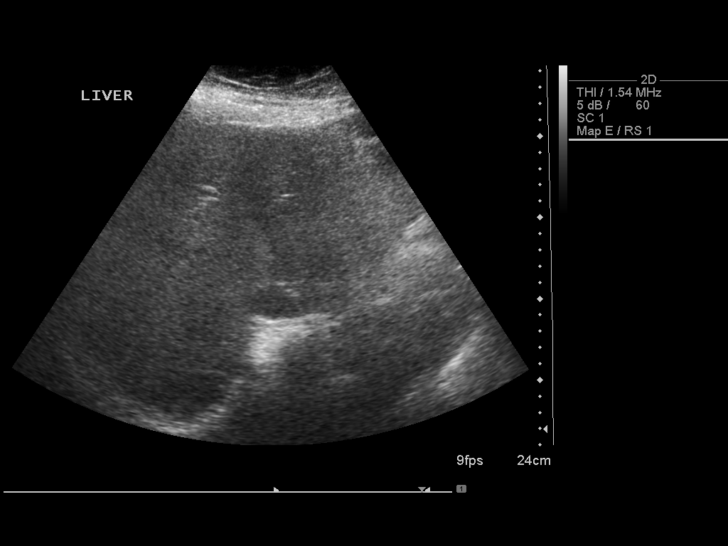
[im 38/66]
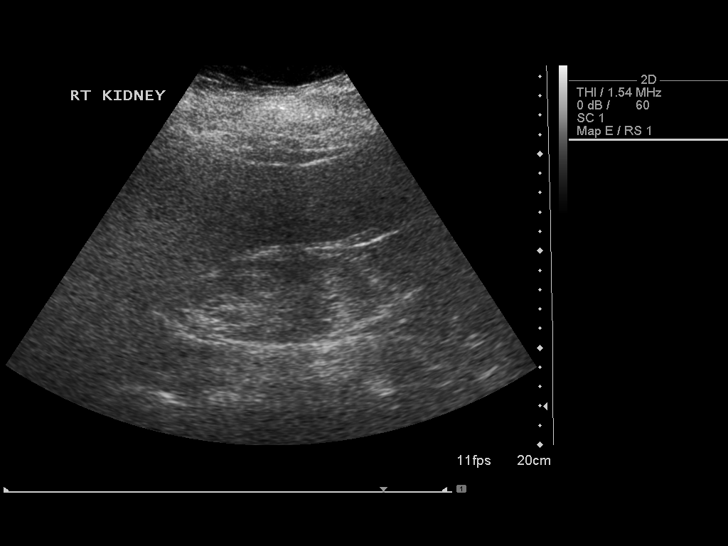
[im 44/66]
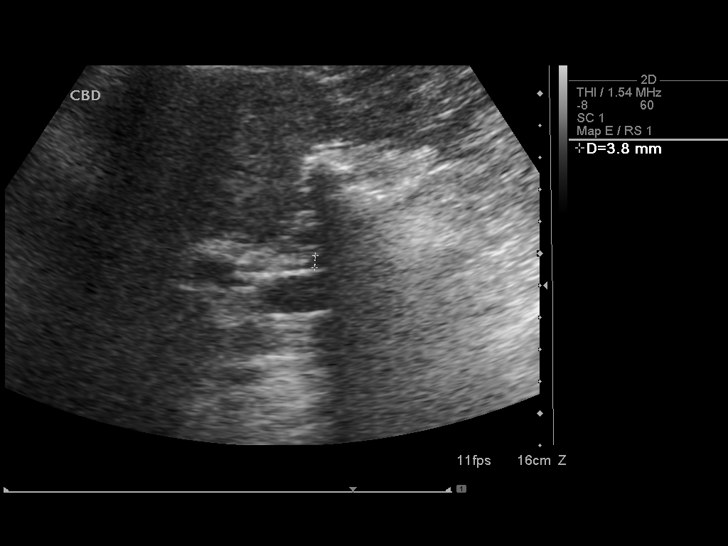
[im 49/66]
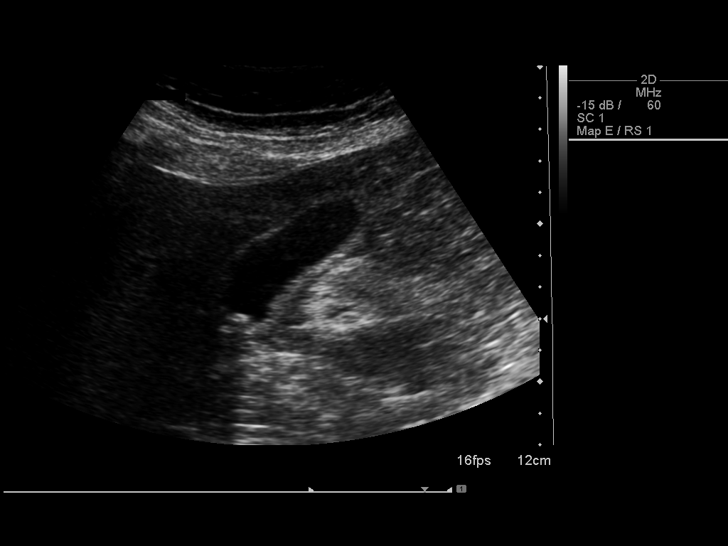
[im 55/66]
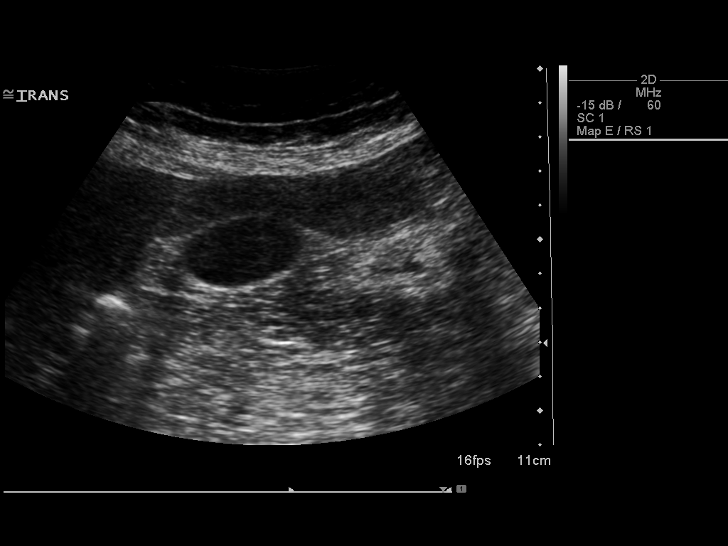
[im 60/66]
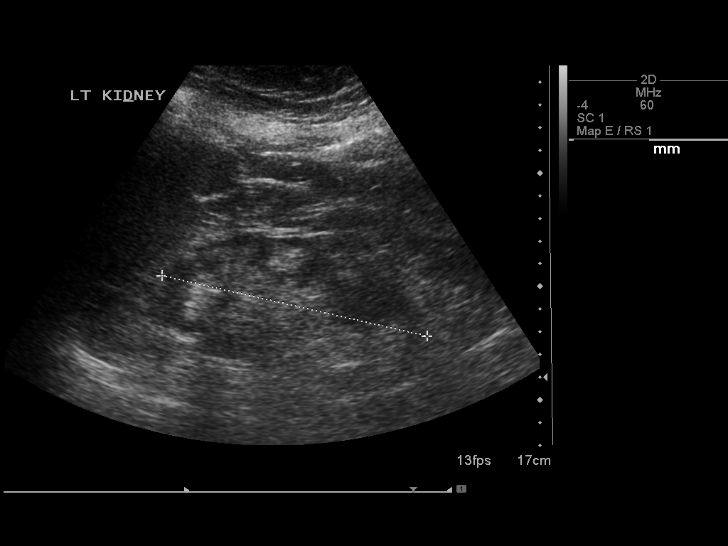
[im 66/66]
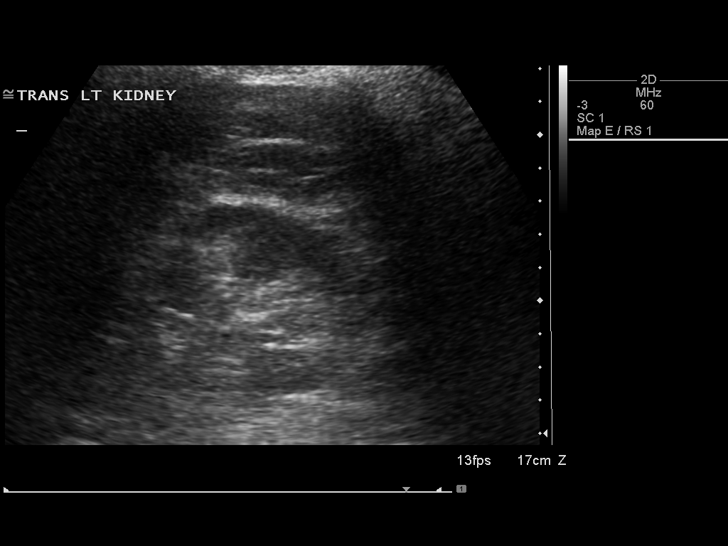

[13 of 25 positions shown; findings below may reference images not displayed]

FINDINGS: Gallbladder:  A 6 mm gallstone is noted in the gallbladder.  No
gallbladder wall thickening, pericholecystic fluid or sonographic
Murphy's sign to suggest acute cholecystitis.

Common bile duct:  Normal in caliber measuring a maximum of 3.8mm.

Liver:  There is diffuse increased echogenicity of the liver and
decreased through transmission consistent with fatty infiltration.
No focal lesions or biliary dilatation.

IVC:  Normal caliber.

Pancreas:  Sonographically unremarkable.

Spleen:  Normal size and echogenicity without focal lesions.

Right Kidney:  12.3 cm in length.  Mild renal cortical thinning and
slight increased echogenicity suggesting medical renal disease.  No
hydronephrosis or renal mass.

Left Kidney:  12.0 cm in length.  Mild renal cortical thinning and
increased echogenicity suggesting medical renal disease.  No
hydronephrosis or renal mass.

Abdominal aorta:  Normal caliber.  Atherosclerotic changes are
noted.
IMPRESSION: 1.  Cholelithiasis without sonographic findings for acute
cholecystitis.
2.  Diffuse fatty infiltration of the liver but no focal hepatic
lesions.
3.  Renal cortical thinning and increased echogenicity suggesting
medical renal disease.

## 2013-03-12 ENCOUNTER — Ambulatory Visit (HOSPITAL_BASED_OUTPATIENT_CLINIC_OR_DEPARTMENT_OTHER): Payer: BC Managed Care – PPO

## 2013-03-12 ENCOUNTER — Other Ambulatory Visit (HOSPITAL_BASED_OUTPATIENT_CLINIC_OR_DEPARTMENT_OTHER): Payer: BC Managed Care – PPO | Admitting: Lab

## 2013-03-12 ENCOUNTER — Ambulatory Visit (HOSPITAL_BASED_OUTPATIENT_CLINIC_OR_DEPARTMENT_OTHER): Payer: BC Managed Care – PPO | Admitting: Hematology & Oncology

## 2013-03-12 VITALS — BP 116/64 | HR 78 | Temp 98.0°F | Resp 16 | Ht 64.0 in | Wt 205.0 lb

## 2013-03-12 DIAGNOSIS — I7410 Embolism and thrombosis of unspecified parts of aorta: Secondary | ICD-10-CM

## 2013-03-12 DIAGNOSIS — D509 Iron deficiency anemia, unspecified: Secondary | ICD-10-CM

## 2013-03-12 DIAGNOSIS — D510 Vitamin B12 deficiency anemia due to intrinsic factor deficiency: Secondary | ICD-10-CM

## 2013-03-12 DIAGNOSIS — I7411 Embolism and thrombosis of thoracic aorta: Secondary | ICD-10-CM

## 2013-03-12 DIAGNOSIS — K5190 Ulcerative colitis, unspecified, without complications: Secondary | ICD-10-CM

## 2013-03-12 LAB — CBC WITH DIFFERENTIAL (CANCER CENTER ONLY)
BASO%: 0.4 % (ref 0.0–2.0)
Eosinophils Absolute: 0.5 10*3/uL (ref 0.0–0.5)
HCT: 39.7 % (ref 34.8–46.6)
LYMPH#: 2.5 10*3/uL (ref 0.9–3.3)
LYMPH%: 14.3 % (ref 14.0–48.0)
MCV: 93 fL (ref 81–101)
MONO#: 0.9 10*3/uL (ref 0.1–0.9)
Platelets: 498 10*3/uL — ABNORMAL HIGH (ref 145–400)
RBC: 4.25 10*6/uL (ref 3.70–5.32)
WBC: 17.3 10*3/uL — ABNORMAL HIGH (ref 3.9–10.0)

## 2013-03-12 LAB — RETICULOCYTES (CHCC): RBC.: 4.28 MIL/uL (ref 3.87–5.11)

## 2013-03-12 LAB — IRON AND TIBC CHCC
%SAT: 16 % — ABNORMAL LOW (ref 21–57)
TIBC: 285 ug/dL (ref 236–444)
UIBC: 240 ug/dL (ref 120–384)

## 2013-03-12 LAB — CHCC SATELLITE - SMEAR

## 2013-03-12 MED ORDER — CYANOCOBALAMIN 1000 MCG/ML IJ SOLN
1000.0000 ug | Freq: Once | INTRAMUSCULAR | Status: AC
  Administered 2013-03-12: 1000 ug via INTRAMUSCULAR

## 2013-03-12 NOTE — Patient Instructions (Signed)

## 2013-03-12 NOTE — Progress Notes (Signed)
This office note has been dictated.

## 2013-03-25 NOTE — Progress Notes (Signed)
CC:   Patric Dykes, MD, Fax [phone]  DIAGNOSES: 1. Idiopathic aortic thrombosis. 2. Ulcerative colitis with intermittent iron-deficiency anemia. 3. Pernicious anemia.  CURRENT THERAPY: 1. Xarelto 20 mg p.o. q. day, lifelong. 2. Vitamin B12 one milligram IM q. month. 3. IV iron as indicated.  INTERIM HISTORY:  Ms. Angst comes in for her followup.  She still feels a little bit tired.  Unfortunately, there is a lot of stress going on in her life right now.  There are a lot of financial issues that she is trying to deal with.  There are issues with one of the children.  She just really is frustrated over what is going on with her family.  She feels as if she has very little control over what is going on.  We last gave her iron back in August.  She got 1020 mg of Feraheme.  She always responds well to the [facility].  Her vitamin B12 level back in August was 1089.  She has had no bleeding.  She has some chronic abdominal issues.  She has chronic neck pain.  She is still smoking about 1-1/2 pack to 2 packs of cigarettes a day.  She has had no change in bowel or bladder habits.  She has not noted any problems with fever, sweats or chills.  There has been no leg swelling.  PHYSICAL EXAMINATION:  General:  This is a moderately obese white female in no obvious distress.  Vital signs:  Temperature of 98, pulse 78, respiratory rate 16, blood pressure 116/64.  Weight is 205.  Head and neck:  Normocephalic, atraumatic skull.  There are no ocular or oral lesions.  There are no palpable cervical or supraclavicular lymph nodes. Lungs:  Clear bilaterally.  Cardiac:  Regular rate and rhythm with a normal S1 and S2.  There are no murmurs, rubs or bruits.  Abdomen: Soft.  She has good bowel sounds.  She is moderately obese.  She has no fluid wave.  There is no guarding or rebound tenderness.  There is no palpable hepatosplenomegaly.  Extremities:  No clubbing, cyanosis or edema.  She has no  venous cord in her legs.  Back.  No tenderness over the spine, ribs, or hips.  Skin:  No rashes, ecchymosis, or petechia.  LABORATORY STUDIES:  White cell count is 17.3, hemoglobin 12.9, hematocrit 39.7, platelet count is 498.  MCV is 93.  Peripheral smear, which I reviewed, shows an increase in white blood cells.  There are mature.  She has an increase in polys.  I do not see any immature myeloid cells.  There are no nucleated red blood cells. Platelets are slightly increased in number.  She may have a few large platelets.  IMPRESSION:  Ms. Knee is a very charming 42 year old white female.  She presented back in January 2011 within an aortic thrombus.  This was idiopathic.  We have done extensive studies with hypercoagulable tests and a bone marrow test.  Everything so far has come back negative for any etiology.  She does not want to stop the Xarelto.  She is doing well with the Xarelto, so we will continue this for now.  We will go ahead and give her vitamin B12 today.  I do not think that she would need iron.  Her hemoglobin is quite good.  We will plan to get her back to see [place] in 2 months' time.  She says she can get her B12 with her family doctor in between when  we see her.  We will continue to pray hard for her and her family situation.    ______________________________ Volanda Napoleon, M.D. PRE/MEDQ  D:  03/12/2013  T:  03/25/2013  Job:  [id number]

## 2013-04-08 ENCOUNTER — Other Ambulatory Visit: Payer: Self-pay | Admitting: Family Medicine

## 2013-04-12 ENCOUNTER — Other Ambulatory Visit: Payer: Self-pay | Admitting: Family Medicine

## 2013-05-14 ENCOUNTER — Ambulatory Visit (HOSPITAL_BASED_OUTPATIENT_CLINIC_OR_DEPARTMENT_OTHER): Payer: BC Managed Care – PPO

## 2013-05-14 ENCOUNTER — Telehealth: Payer: Self-pay | Admitting: Hematology & Oncology

## 2013-05-14 ENCOUNTER — Ambulatory Visit (HOSPITAL_BASED_OUTPATIENT_CLINIC_OR_DEPARTMENT_OTHER): Payer: BC Managed Care – PPO | Admitting: Hematology & Oncology

## 2013-05-14 ENCOUNTER — Other Ambulatory Visit (HOSPITAL_BASED_OUTPATIENT_CLINIC_OR_DEPARTMENT_OTHER): Payer: BC Managed Care – PPO | Admitting: Lab

## 2013-05-14 VITALS — BP 146/74 | HR 77 | Temp 98.2°F | Resp 14 | Ht 64.0 in | Wt 206.0 lb

## 2013-05-14 DIAGNOSIS — D509 Iron deficiency anemia, unspecified: Secondary | ICD-10-CM

## 2013-05-14 DIAGNOSIS — K5190 Ulcerative colitis, unspecified, without complications: Secondary | ICD-10-CM

## 2013-05-14 DIAGNOSIS — I7411 Embolism and thrombosis of thoracic aorta: Secondary | ICD-10-CM

## 2013-05-14 DIAGNOSIS — D510 Vitamin B12 deficiency anemia due to intrinsic factor deficiency: Secondary | ICD-10-CM

## 2013-05-14 DIAGNOSIS — I7410 Embolism and thrombosis of unspecified parts of aorta: Secondary | ICD-10-CM

## 2013-05-14 LAB — CBC WITH DIFFERENTIAL (CANCER CENTER ONLY)
BASO%: 0.6 % (ref 0.0–2.0)
EOS%: 3.2 % (ref 0.0–7.0)
HCT: 37.7 % (ref 34.8–46.6)
HGB: 11.7 g/dL (ref 11.6–15.9)
LYMPH#: 2.8 10*3/uL (ref 0.9–3.3)
MCHC: 31 g/dL — ABNORMAL LOW (ref 32.0–36.0)
MONO#: 0.9 10*3/uL (ref 0.1–0.9)
NEUT#: 10.5 10*3/uL — ABNORMAL HIGH (ref 1.5–6.5)
NEUT%: 70.7 % (ref 39.6–80.0)
RDW: 18 % — ABNORMAL HIGH (ref 11.1–15.7)
WBC: 14.8 10*3/uL — ABNORMAL HIGH (ref 3.9–10.0)

## 2013-05-14 LAB — IRON AND TIBC CHCC
%SAT: 5 % — ABNORMAL LOW (ref 21–57)
TIBC: 374 ug/dL (ref 236–444)
UIBC: 354 ug/dL (ref 120–384)

## 2013-05-14 MED ORDER — CYANOCOBALAMIN 1000 MCG/ML IJ SOLN
INTRAMUSCULAR | Status: AC
  Filled 2013-05-14: qty 1

## 2013-05-14 MED ORDER — CYANOCOBALAMIN 1000 MCG/ML IJ SOLN
1000.0000 ug | Freq: Once | INTRAMUSCULAR | Status: AC
  Administered 2013-05-14: 1000 ug via INTRAMUSCULAR

## 2013-05-14 NOTE — Progress Notes (Signed)
This office note has been dictated.

## 2013-05-14 NOTE — Patient Instructions (Signed)
Cyanocobalamin, Vitamin B12 injection What is this medicine? CYANOCOBALAMIN (sye an oh koe BAL a min) is a man made form of vitamin B12. Vitamin B12 is used in the growth of healthy blood cells, nerve cells, and proteins in the body. It also helps with the metabolism of fats and carbohydrates. This medicine is used to treat people who can not absorb vitamin B12. This medicine may be used for other purposes; ask your health care provider or pharmacist if you have questions. COMMON BRAND NAME(S): Cyomin, LA-12 , Nutri-Twelve , Primabalt What should I tell my health care provider before I take this medicine? They need to know if you have any of these conditions: -kidney disease -Leber's disease -megaloblastic anemia -an unusual or allergic reaction to cyanocobalamin, cobalt, other medicines, foods, dyes, or preservatives -pregnant or trying to get pregnant -breast-feeding How should I use this medicine? This medicine is injected into a muscle or deeply under the skin. It is usually given by a health care professional in a clinic or doctor's office. However, your doctor may teach you how to inject yourself. Follow all instructions. Talk to your pediatrician regarding the use of this medicine in children. Special care may be needed. Overdosage: If you think you have taken too much of this medicine contact a poison control center or emergency room at once. NOTE: This medicine is only for you. Do not share this medicine with others. What if I miss a dose? If you are given your dose at a clinic or doctor's office, call to reschedule your appointment. If you give your own injections and you miss a dose, take it as soon as you can. If it is almost time for your next dose, take only that dose. Do not take double or extra doses. What may interact with this medicine? -colchicine -heavy alcohol intake This list may not describe all possible interactions. Give your health care provider a list of all the  medicines, herbs, non-prescription drugs, or dietary supplements you use. Also tell them if you smoke, drink alcohol, or use illegal drugs. Some items may interact with your medicine. What should I watch for while using this medicine? Visit your doctor or health care professional regularly. You may need blood work done while you are taking this medicine. You may need to follow a special diet. Talk to your doctor. Limit your alcohol intake and avoid smoking to get the best benefit. What side effects may I notice from receiving this medicine? Side effects that you should report to your doctor or health care professional as soon as possible: -allergic reactions like skin rash, itching or hives, swelling of the face, lips, or tongue -blue tint to skin -chest tightness, pain -difficulty breathing, wheezing -dizziness -red, swollen painful area on the leg Side effects that usually do not require medical attention (report to your doctor or health care professional if they continue or are bothersome): -diarrhea -headache This list may not describe all possible side effects. Call your doctor for medical advice about side effects. You may report side effects to FDA at 1-800-FDA-1088. Where should I keep my medicine? Keep out of the reach of children. Store at room temperature between 15 and 30 degrees C (59 and 85 degrees F). Protect from light. Throw away any unused medicine after the expiration date. NOTE: This sheet is a summary. It may not cover all possible information. If you have questions about this medicine, talk to your doctor, pharmacist, or health care provider.  2014, Elsevier/Gold Standard. (2007-09-23  22:10:20) ° °

## 2013-05-14 NOTE — Telephone Encounter (Signed)
Per in basket for monthly B12 inj pt is going to get them at PCP. She will get one here with MD appointment

## 2013-05-16 ENCOUNTER — Other Ambulatory Visit: Payer: Self-pay | Admitting: *Deleted

## 2013-05-16 ENCOUNTER — Telehealth: Payer: Self-pay | Admitting: *Deleted

## 2013-05-16 DIAGNOSIS — D509 Iron deficiency anemia, unspecified: Secondary | ICD-10-CM

## 2013-05-16 NOTE — Telephone Encounter (Addendum)
Message copied by Mirian Capuchin on Fri May 16, 2013 12:57 PM ------      Message from: Arlan Organ R      Created: Wed May 14, 2013  4:51 PM       Call - iron is very low again!!  Need to set up 1 dose of Feraheme of 1020mg .  Please let her know!!  We can do this after Thanksgiving!!  Pete ------This message given to pt.  States she will call back after Thanksgiving and get this set up.

## 2013-05-17 NOTE — Progress Notes (Signed)
DIAGNOSES: 1. Idiopathic aortic thrombosis. 2. Pernicious anemia. 3. Iron-deficiency anemia. 4. Ulcerative colitis.  CURRENT THERAPY: 1. Xarelto 20 mg p.o. q. day. 2. Vitamin B12 1 mg IM every month. 3. IV iron as needed.  INTERIM HISTORY:  Kari Hahn comes in for followup.  She is doing okay. She is worried about her grandmother.  She is in the hospital right now. She had knee surgery but she looks like she developed a heart block. She is going to have a pacemaker placed.  With Kari Hahn, we last gave her iron back in August.  When we saw her in September, her iron studies showed a ferritin of 75, with an iron saturation of 16%.  She has had no obvious bleeding.  She says she has cut back on tobacco use.  She says she will stop smoking by the Christmas holiday.  PHYSICAL EXAMINATION:  General:  This is a well-developed, well- nourished white female, who is mildly obese.  Vital Signs:  Temperature of 98.2, pulse 77, respiratory rate 14, blood pressure 146/74, weight is 206 pounds.  Head and Neck:  Normocephalic, atraumatic skull.  There are no ocular or oral lesions.  There are no palpable cervical or supraclavicular lymph nodes.  Lungs:  Clear bilaterally.  Cardiac Exam: Regular rate and rhythm with normal S1 and S2.  There are no murmurs, rubs, or bruits.  Abdomen:  Soft.  She has good bowel sounds.  She has no palpable abdominal mass.  No palpable hepatosplenomegaly. Extremities:  Show no clubbing, cyanosis, or edema.  Neurologic:  Shows no focal neurological deficits.  Skin:  No rash, ecchymosis, or petechia.  LABORATORY STUDIES:  White cell count 14.8, hemoglobin 11.7, hematocrit 37.7, platelet count 483.  MCV is 94.  IMPRESSION:  Kari Hahn is a very nice 42 year old white female with a history of an idiopathic aortic thrombus.  She presented back in I think January 2011.  We have her on lifelong anticoagulation.  She does not have any obvious hypercoagulable state.   We have done a bone marrow test on her.  Bone marrow is also unremarkable.  I think that the fact that she has cut back on smoking is helping her thrombocytosis a little bit.  We will see what her iron studies show.  We typically have to give her iron every few months.  We will go ahead and plan to get her back to see [place] in another 3 months.  She comes in monthly for her B12.    ______________________________ Volanda Napoleon, M.D. PRE/MEDQ  D:  05/14/2013  T:  05/17/2013  Job:  [id number]

## 2013-06-02 ENCOUNTER — Other Ambulatory Visit: Payer: Self-pay | Admitting: *Deleted

## 2013-06-02 DIAGNOSIS — D509 Iron deficiency anemia, unspecified: Secondary | ICD-10-CM

## 2013-06-02 IMAGING — CR DG CHEST 2V
2 series · 2 of 2 positions shown · non-contrast
Comparison: Radiographs dated 01/28/2009

CLINICAL DATA: Cholelithiasis.  Preoperative respiratory exam.
Chest congestion.

CHEST - 2 VIEW

[view not recorded (1 of 2)]
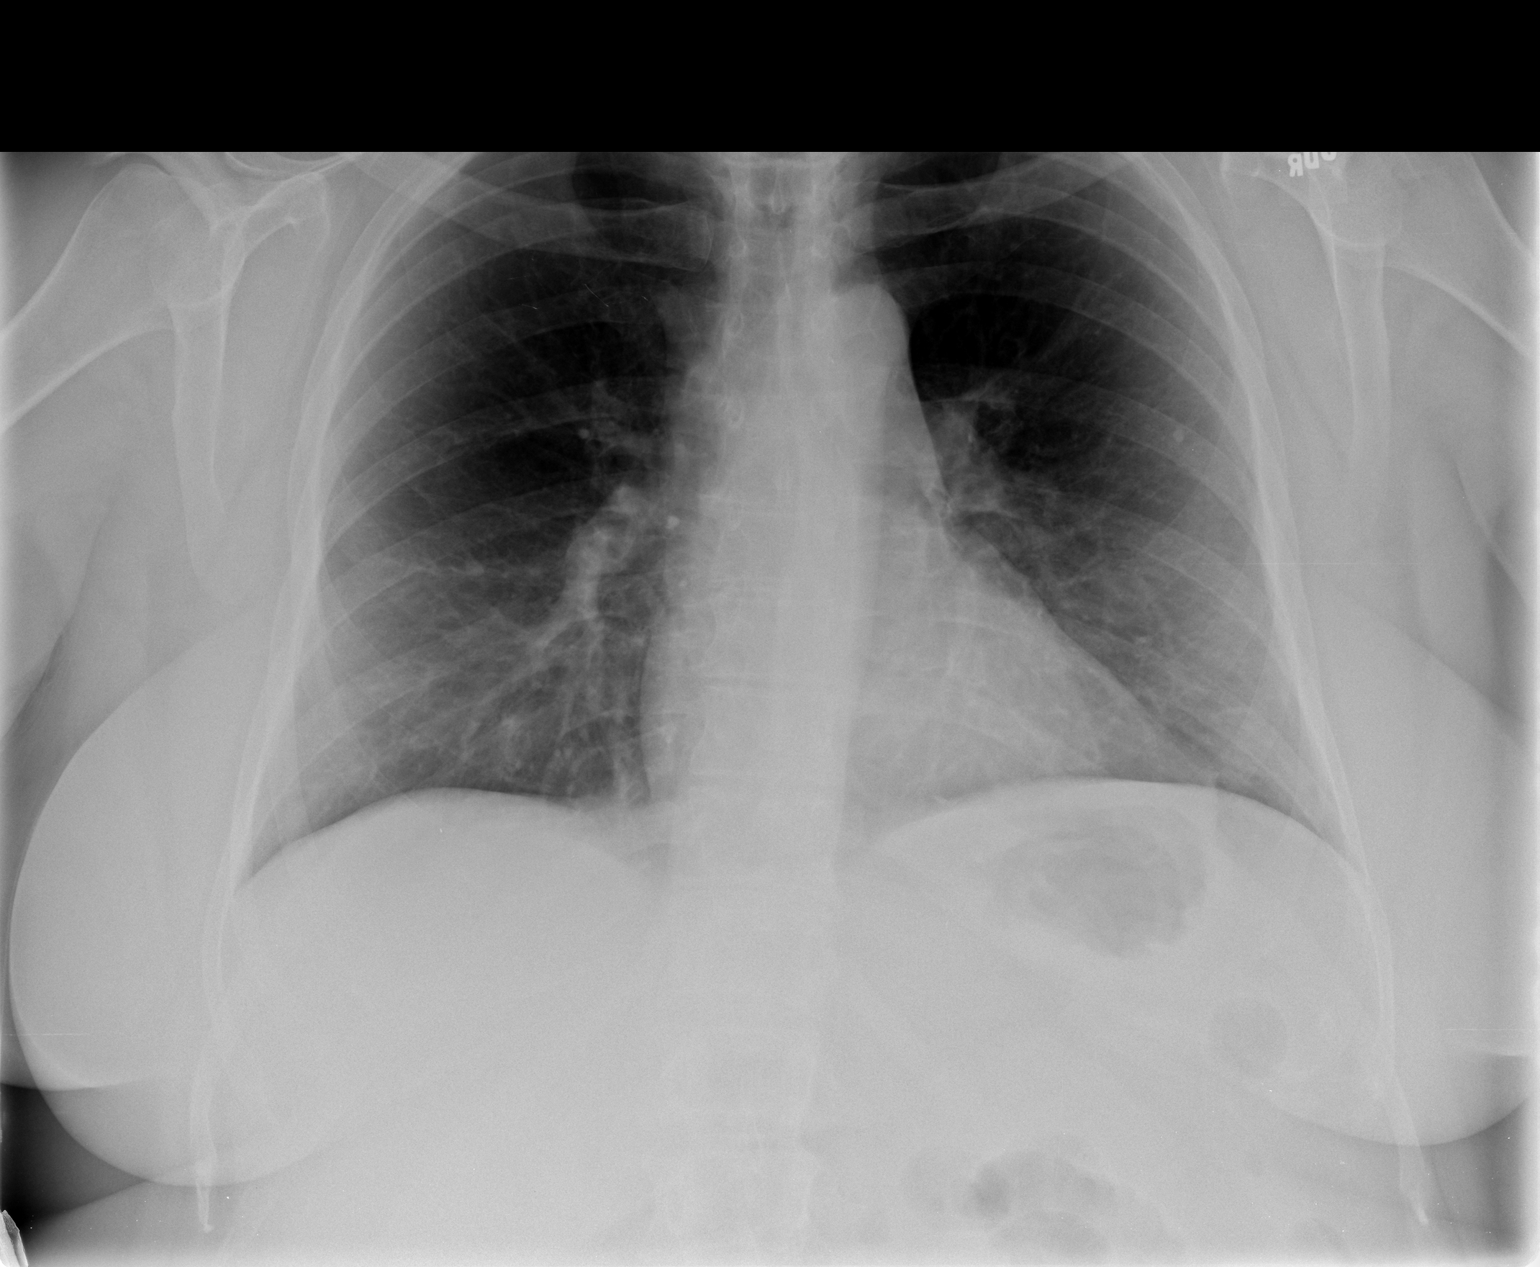

[view not recorded (2 of 2)]
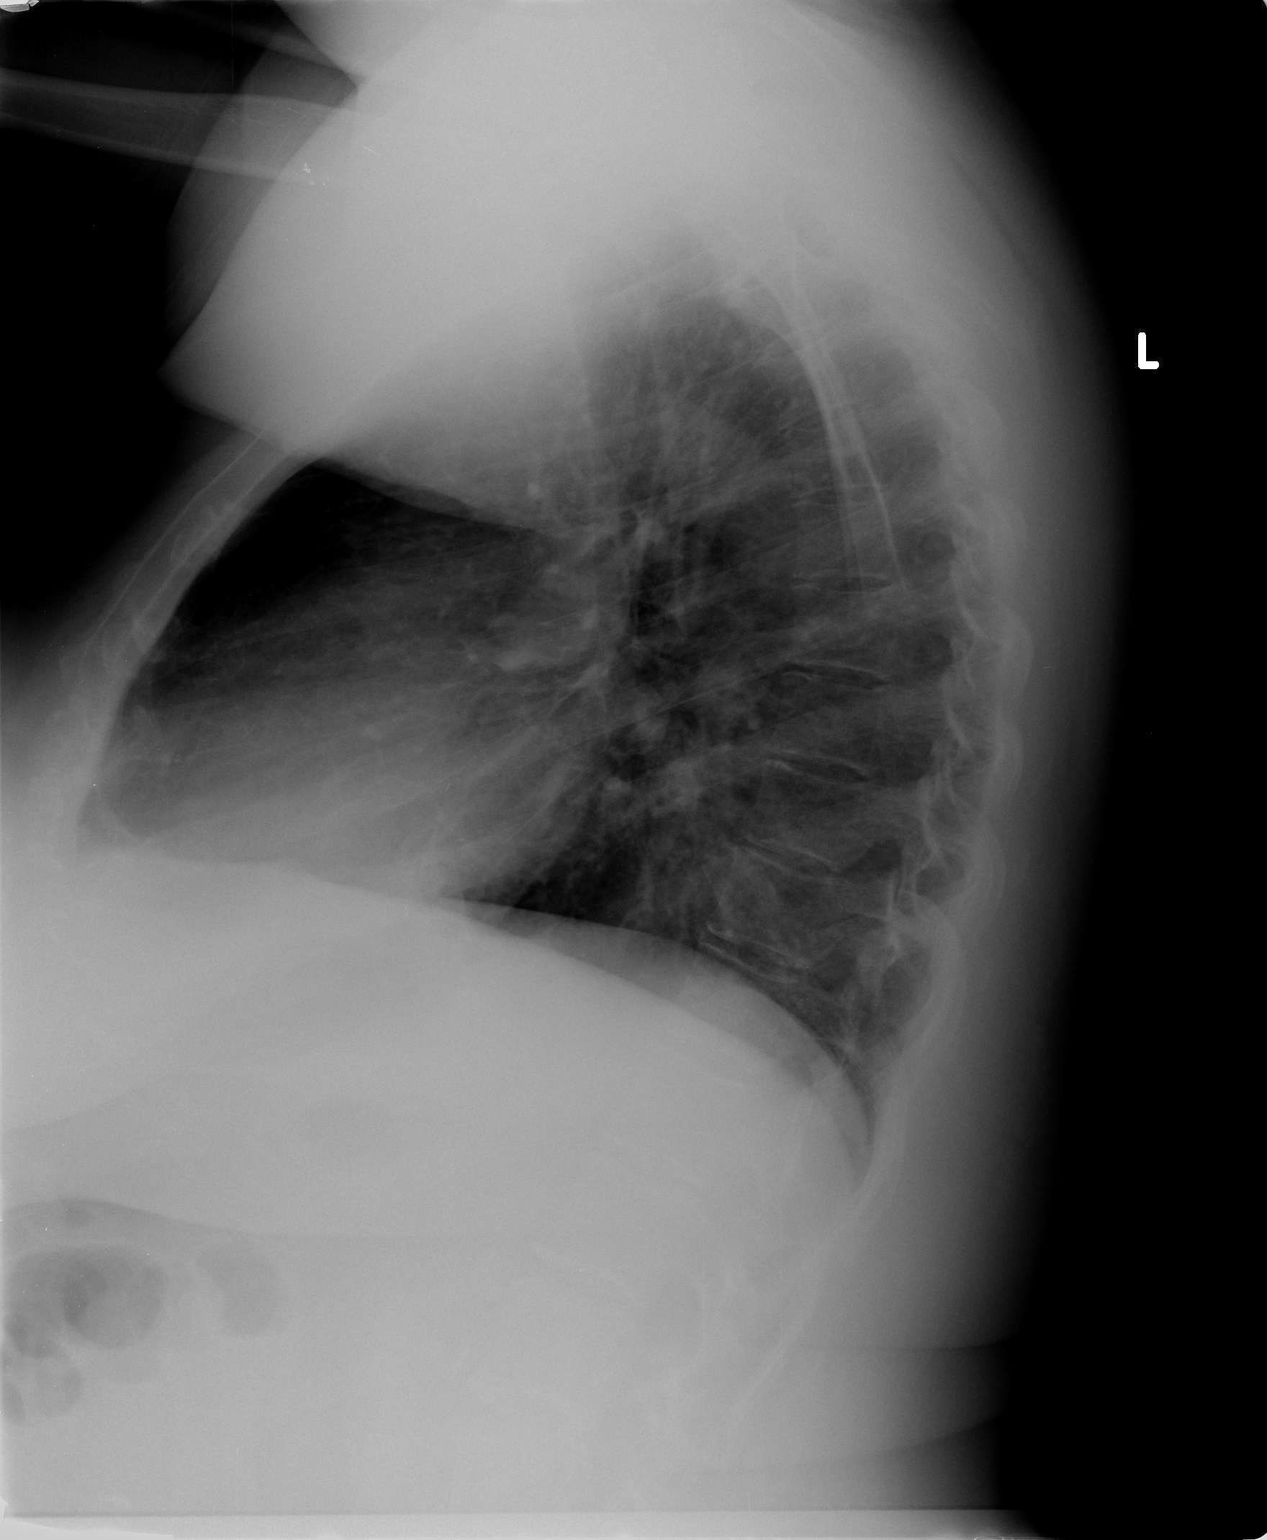

[2 of 2 positions shown; findings below may reference images not displayed]

FINDINGS: Heart size and pulmonary vascularity are normal and the
lungs are clear except for a tiny granuloma in the superior segment
of the left lower lobe.  No osseous abnormality.
IMPRESSION: No acute abnormalities.

## 2013-06-02 NOTE — Progress Notes (Signed)
CC:   Kari Hahn, P.A.  DIAGNOSES: 1. Idiopathic aortic thrombosis. 2. Ulcerative colitis with intermittent iron-deficiency anemia. 3. Pernicious anemia.  CURRENT THERAPY: 1. Xarelto 20 mg p.o. daily -- lifelong. 2. Vitamin B12 at 1 mg IM monthly. 3. IV iron as indicated.  INTERIM HISTORY:  Kari Hahn come in for followup.  Kari Hahn still feels a little bit tired.  Unfortunately, there is a lot of stress going on in her life right now.  There are a lot of financial issues that Kari Hahn is trying to deal with.  There are issues with one of the children.  Kari Hahn just really is frustrated over what is going on with her family.  Kari Hahn feels as if Kari Hahn has very little control over what is going.  We last gave her iron back in August.  Kari Hahn got 1020 mg of Feraheme.  Kari Hahn was responding well to the [facility].  Her vitamin B12 level back in August was 1089.  Kari Hahn has had no bleeding.  Kari Hahn has had some chronic abdominal issues. Kari Hahn has chronic neck pain.  Kari Hahn is still smoking about 1-1/2 to 2 packs of cigarettes a day.  Kari Hahn has had no change in bowel or bladder habits.  Kari Hahn has not noted any problems with fever, sweats, or chills.  There has been no leg swelling.  PHYSICAL EXAMINATION:  General:  This is a mildly obese white female in no obvious distress.  Vital Signs:  Temperature of 98, pulse 78, respiratory rate 16, blood pressure 116/64, weight is 205.  Head and Neck:  Normocephalic, atraumatic skull.  There are no ocular or oral lesions.  There are no palpable cervical or supraclavicular lymph nodes. Lungs:  Clear bilaterally.  Cardiac:  Regular rate and rhythm with a normal S1, S2.  There are no murmurs, rubs, or bruits.  Abdomen:  Soft. Kari Hahn has good bowel sounds.  Kari Hahn is mildly obese.  Kari Hahn has no fluid wave. There is no guarding or rebound or tenderness.  There is no palpable hepatosplenomegaly.  Extremities:  No clubbing, cyanosis, or edema.  Kari Hahn has no venous cord in her legs.  Back:  No  tenderness over the spine, ribs, or hips.  Skin:  No rashes, ecchymosis, or petechia.  LABORATORY STUDIES:  White cell count is 17.3, hemoglobin 12.9, hematocrit 39.7, platelet count is 498.  MCV is 93.  Peripheral smear, which I reviewed, shows an increase in white blood cells.  They are mature.  Kari Hahn has an increase in polys.  I do not see any immature myeloid cells.  There are no nucleated red blood cells. Platelets are slightly increased in number.  Kari Hahn may have a few large platelets.  IMPRESSION:  [name] is a very charming 42 year old white female.  Kari Hahn presented back in January 2011 with an aortic thrombus.  This was idiopathic.  We have done extensive studies with hypercoagulable tests and a bone marrow test.  Everything so far has come back negative for any etiology.  Kari Hahn does not want to stop the Xarelto.  Kari Hahn is doing well with the Xarelto, so we will continue this for now.  We will go ahead give her vitamin B12 today.  I do not think that Kari Hahn would need iron.  Her hemoglobin is quite good.  We plan to get her back to see [place] in 2 months' time.  Kari Hahn says Kari Hahn can get her B12 with her family doctor in between when we see her.  We will continue to  pray hard for her and her family situation.    ______________________________ Volanda Napoleon, M.D. PRE/MEDQ  D:  03/12/2013  T:  06/01/2013  Job:  [id number]

## 2013-06-05 ENCOUNTER — Ambulatory Visit (HOSPITAL_BASED_OUTPATIENT_CLINIC_OR_DEPARTMENT_OTHER): Payer: BC Managed Care – PPO

## 2013-06-05 VITALS — BP 132/79 | HR 94 | Temp 97.1°F | Resp 16

## 2013-06-05 DIAGNOSIS — D509 Iron deficiency anemia, unspecified: Secondary | ICD-10-CM

## 2013-06-05 MED ORDER — SODIUM CHLORIDE 0.9 % IV SOLN
1020.0000 mg | Freq: Once | INTRAVENOUS | Status: AC
  Administered 2013-06-05: 1020 mg via INTRAVENOUS
  Filled 2013-06-05: qty 34

## 2013-06-05 MED ORDER — SODIUM CHLORIDE 0.9 % IV SOLN
Freq: Once | INTRAVENOUS | Status: AC
  Administered 2013-06-05: 12:00:00 via INTRAVENOUS

## 2013-06-06 IMAGING — RF DG CHOLANGIOGRAM OPERATIVE
1 series · 4 of 4 positions shown · non-contrast
Comparison: CT 01/31/2012

CLINICAL DATA: Cholelithiasis.

INTRAOPERATIVE CHOLANGIOGRAM
TECHNIQUE: Cholangiographic images from the C-arm fluoroscopic
device were submitted for interpretation post-operatively.  Please
see the procedural report for the amount of contrast and the
fluoroscopy time utilized.

[Series 1: run · 4 of 35 frames shown]
[frame 6/35]
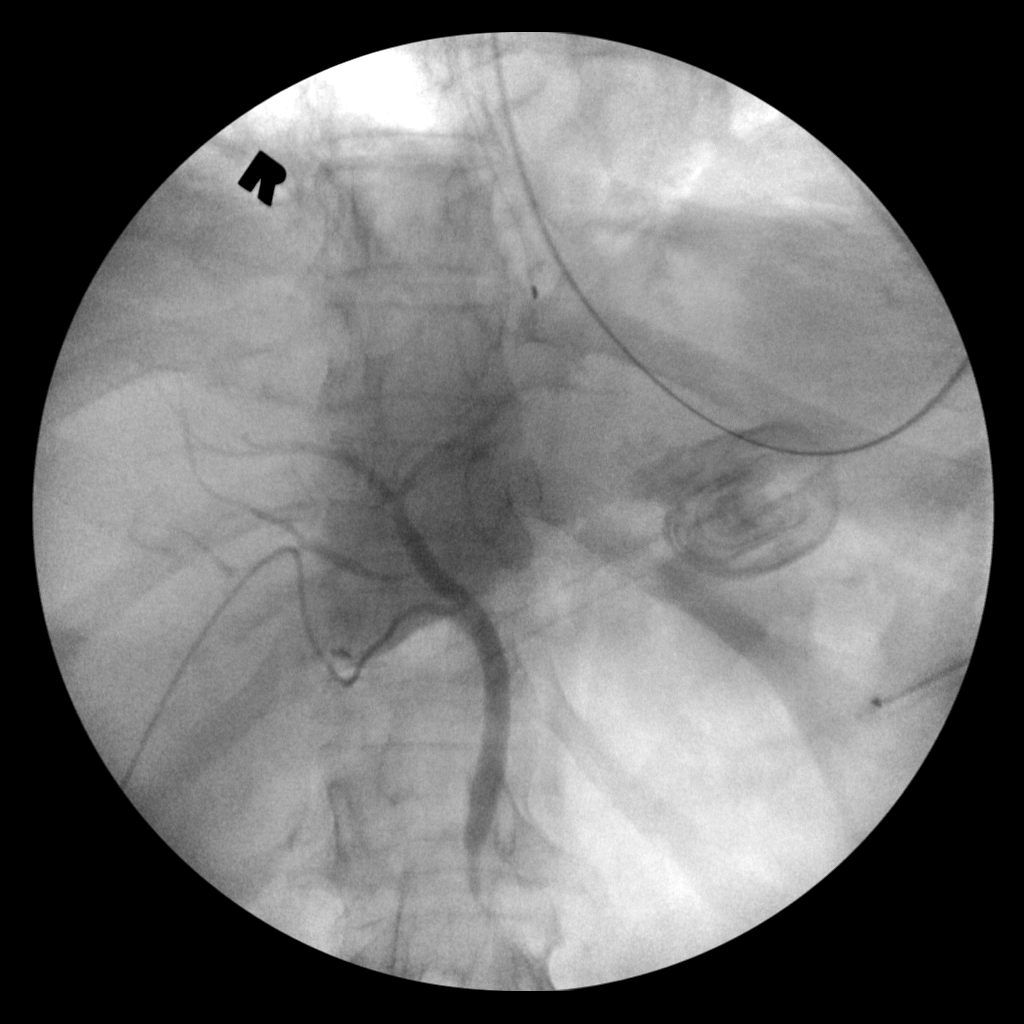
[frame 12/35]
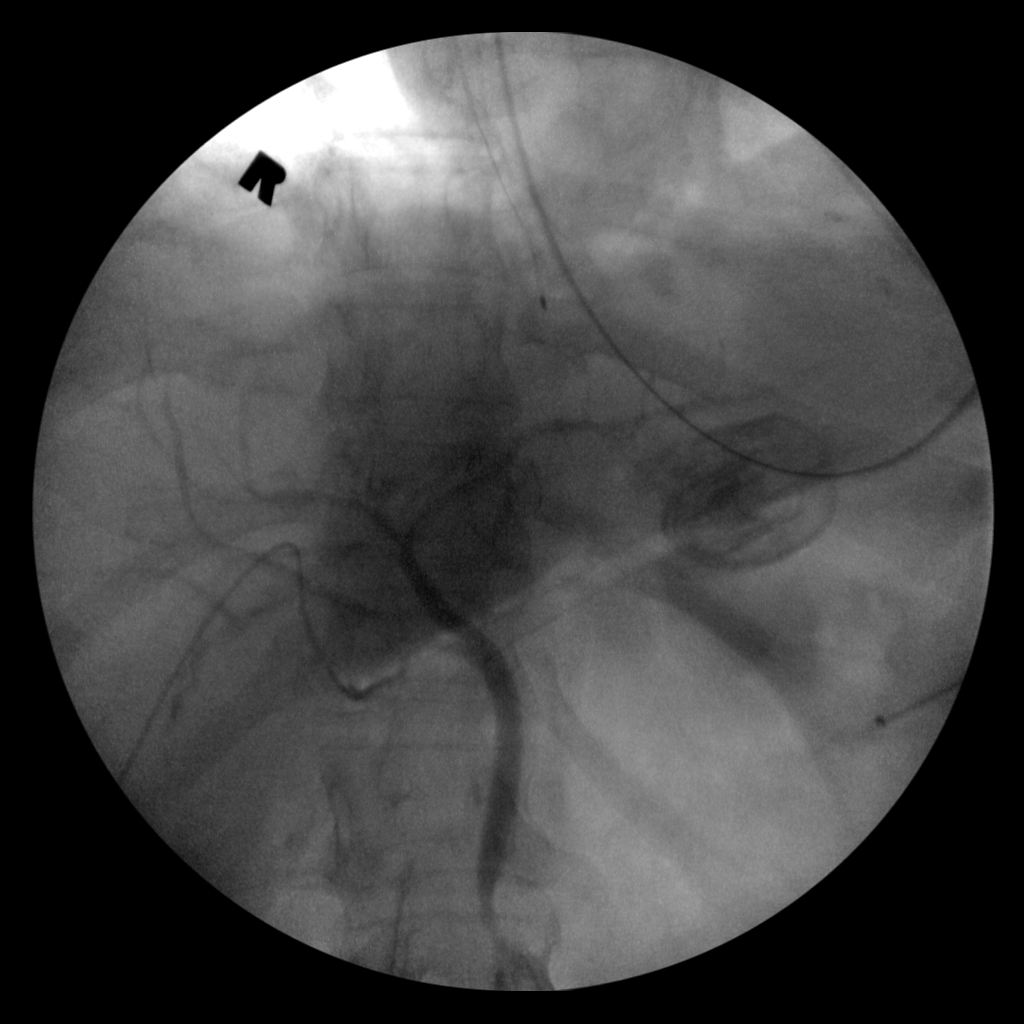
[frame 18/35]
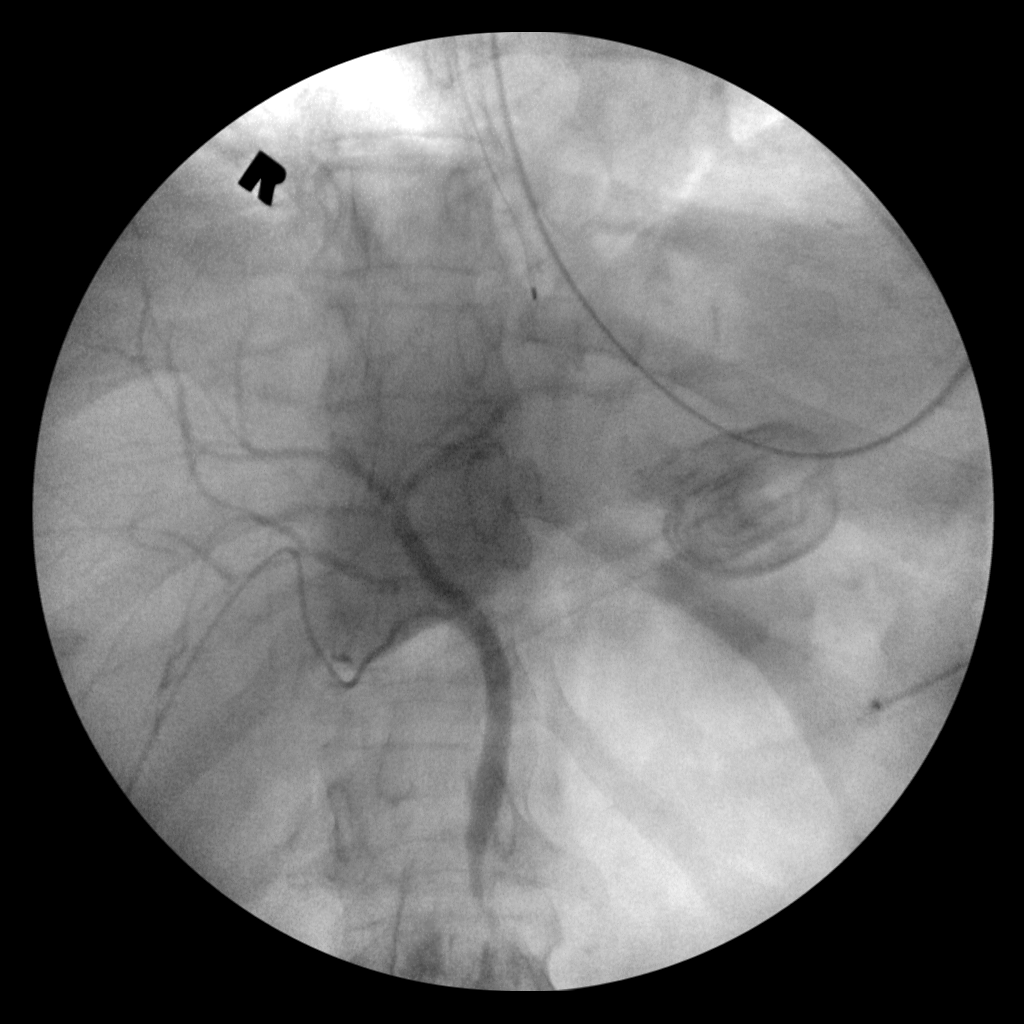
[frame 30/35]
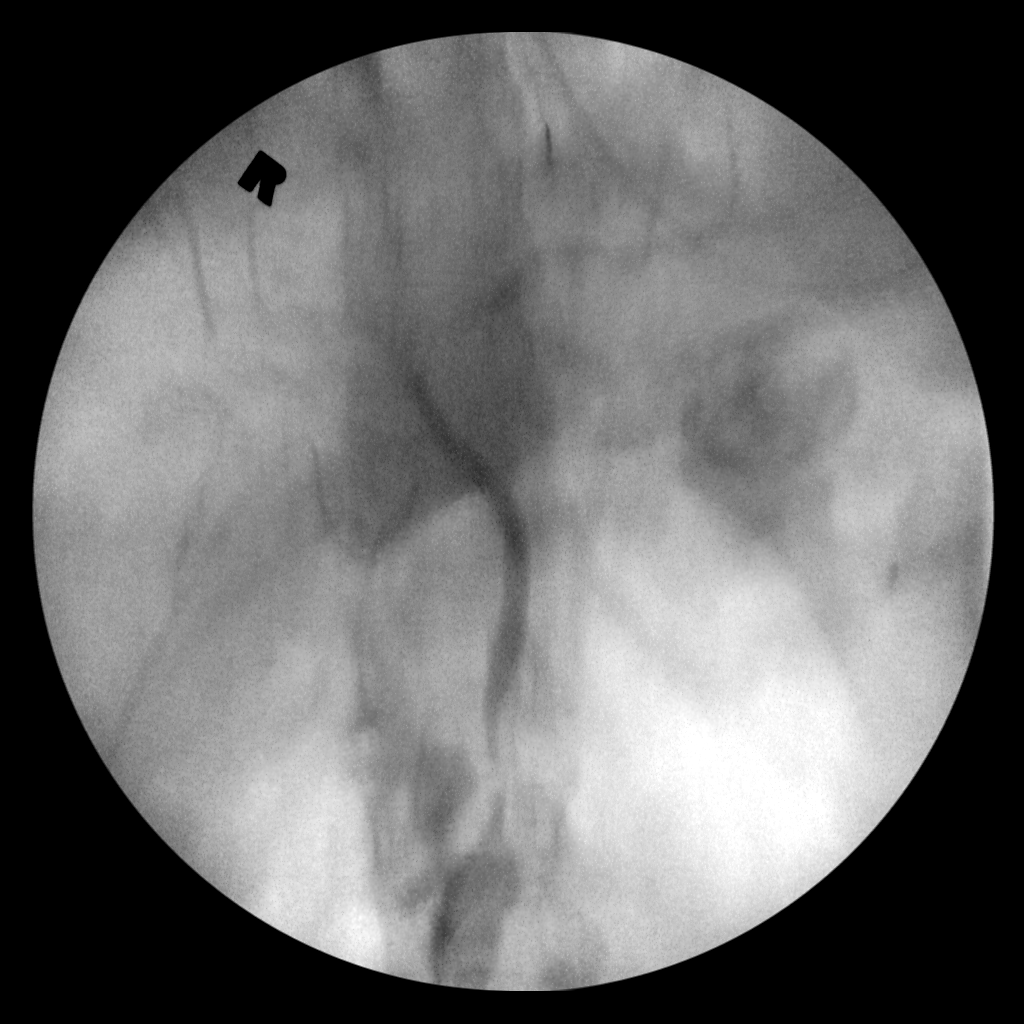

[4 of 4 positions shown; findings below may reference images not displayed]

FINDINGS: Contrast fills the intrahepatic and extrahepatic biliary
system.  The biliary system is nondilated.  There are no large
filling defects or obstructing lesions.  Contrast drains into the
duodenum.  Images are somewhat limited due to respiratory motion.
IMPRESSION: Normal intraoperative cholangiogram.  No evidence for obstructing
stones.

## 2013-06-17 ENCOUNTER — Telehealth: Payer: Self-pay | Admitting: *Deleted

## 2013-06-17 ENCOUNTER — Emergency Department (HOSPITAL_BASED_OUTPATIENT_CLINIC_OR_DEPARTMENT_OTHER)
Admission: EM | Admit: 2013-06-17 | Discharge: 2013-06-18 | Disposition: A | Discharge status: Home / Self Care | Payer: BC Managed Care – PPO | Attending: Emergency Medicine | Admitting: Emergency Medicine

## 2013-06-17 ENCOUNTER — Encounter (HOSPITAL_BASED_OUTPATIENT_CLINIC_OR_DEPARTMENT_OTHER): Payer: Self-pay | Admitting: Emergency Medicine

## 2013-06-17 ENCOUNTER — Emergency Department (HOSPITAL_BASED_OUTPATIENT_CLINIC_OR_DEPARTMENT_OTHER): Payer: BC Managed Care – PPO

## 2013-06-17 DIAGNOSIS — G5790 Unspecified mononeuropathy of unspecified lower limb: Secondary | ICD-10-CM | POA: Insufficient documentation

## 2013-06-17 DIAGNOSIS — Z7901 Long term (current) use of anticoagulants: Secondary | ICD-10-CM | POA: Insufficient documentation

## 2013-06-17 DIAGNOSIS — D510 Vitamin B12 deficiency anemia due to intrinsic factor deficiency: Secondary | ICD-10-CM | POA: Insufficient documentation

## 2013-06-17 DIAGNOSIS — K219 Gastro-esophageal reflux disease without esophagitis: Secondary | ICD-10-CM | POA: Insufficient documentation

## 2013-06-17 DIAGNOSIS — F411 Generalized anxiety disorder: Secondary | ICD-10-CM | POA: Insufficient documentation

## 2013-06-17 DIAGNOSIS — M79661 Pain in right lower leg: Secondary | ICD-10-CM

## 2013-06-17 DIAGNOSIS — Z8679 Personal history of other diseases of the circulatory system: Secondary | ICD-10-CM | POA: Insufficient documentation

## 2013-06-17 DIAGNOSIS — D509 Iron deficiency anemia, unspecified: Secondary | ICD-10-CM | POA: Insufficient documentation

## 2013-06-17 DIAGNOSIS — Z79899 Other long term (current) drug therapy: Secondary | ICD-10-CM | POA: Insufficient documentation

## 2013-06-17 DIAGNOSIS — E876 Hypokalemia: Secondary | ICD-10-CM | POA: Insufficient documentation

## 2013-06-17 DIAGNOSIS — R059 Cough, unspecified: Secondary | ICD-10-CM | POA: Insufficient documentation

## 2013-06-17 DIAGNOSIS — I10 Essential (primary) hypertension: Secondary | ICD-10-CM | POA: Insufficient documentation

## 2013-06-17 DIAGNOSIS — K5090 Crohn's disease, unspecified, without complications: Secondary | ICD-10-CM | POA: Insufficient documentation

## 2013-06-17 DIAGNOSIS — Z6841 Body Mass Index (BMI) 40.0 and over, adult: Secondary | ICD-10-CM | POA: Insufficient documentation

## 2013-06-17 DIAGNOSIS — F17200 Nicotine dependence, unspecified, uncomplicated: Secondary | ICD-10-CM | POA: Insufficient documentation

## 2013-06-17 DIAGNOSIS — M549 Dorsalgia, unspecified: Secondary | ICD-10-CM | POA: Insufficient documentation

## 2013-06-17 DIAGNOSIS — M79609 Pain in unspecified limb: Secondary | ICD-10-CM | POA: Insufficient documentation

## 2013-06-17 DIAGNOSIS — R05 Cough: Secondary | ICD-10-CM | POA: Insufficient documentation

## 2013-06-17 MED ORDER — SODIUM CHLORIDE 0.9 % IV SOLN: Freq: Once | INTRAVENOUS | Status: DC

## 2013-06-17 MED ORDER — IOHEXOL 350 MG/ML SOLN
100.0000 mL | Freq: Once | INTRAVENOUS | Status: AC | PRN
  Administered 2013-06-18: 100 mL via INTRAVENOUS

## 2013-06-17 NOTE — Telephone Encounter (Signed)
Pt called stating she was concerned about her blood clots moving around. She told Alvino Chapel that she has been having pain below knees, left arm & elbow, right arm & back along with shortness of breath over a 2 week period. Pt was instructed to go to the ER ASAP for evaluation. She told her she would go but hesitated because of all of the Christmas things that were happening. Alvino Chapel reinforced that if she developed a PE or blot to the brain it could be a life threatening issue. Jamisyn stated she would go now.

## 2013-06-17 NOTE — ED Notes (Addendum)
bilat lower leg pain, bilat arm pain from elbow down and sob-c/o started approx 2-3 weeks-states she called Dr Blenda Peals office this am with c/o-called back approx 2 hours ago and was advised by office to come to ED due to hx of blood clots "on the back side of the heart" 3-4 yrs ago-pt speaking in full sentences with no resp distress noted

## 2013-06-17 NOTE — ED Provider Notes (Signed)
CSN: [id number]     Arrival date & time 06/17/13  2056 History  This chart was scribed for Veryl Speak, MD by Donato Schultz, ED Scribe. This patient was seen in room MH03/MH03 and the patient's care was started at 11:20 PM.     Chief Complaint  Patient presents with  . Leg Pain    Patient is a 42 y.o. female presenting with leg pain. The history is provided by the patient. No language interpreter was used.  Leg Pain Associated symptoms: back pain    HPI Comments: Kari Hahn is a 42 y.o. female who presents to the Emergency Department complaining of constant bilateral leg pain located below her calves.  She describes the pain as stinging and burning.  She denies any recent trauma.  She states that she woke up this morning with SOB.  The patient also states that she experienced a shooting left arm pain last week.  She states that she has had trouble with her ulnar nerve this past year.  She is also complaining of constant back pain.  she states that she is taking muscle relaxants for her symptoms.  The patient states she came to [facility] a year ago presenting with the same symptoms and a CT angiogram revealed a blood clot behind her heart.  She states that the blood clot was not removed.  She states that she is taking Xarelto.  The patient states that she has not had a blood clot since being on anticoagulants.  The patient states that she saw her hematologist today and was told to report to the ED due to his clinical suspicion of a blood clot.     Past Medical History  Diagnosis Date  . 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 Anemia, iron deficiency 09/28/2011  . Anemia, pernicious 02/09/2012  . Hypertension   . Morbid obesity with BMI of 40.0-44.9, adult   . GERD (gastroesophageal reflux disease)   . Anxiety   . Neuropathy of leg     right leg  . Crohn disease    Past Surgical History  Procedure Laterality Date  . Cesarean section    . Laparoscopic nissen fundoplication    . Knee surgery    .  Cholecystectomy  03/29/2012    Procedure: LAPAROSCOPIC CHOLECYSTECTOMY WITH INTRAOPERATIVE CHOLANGIOGRAM;  Surgeon: Odis Hollingshead, MD;  Location: [place];  Service: General;  Laterality: N/A;  laparoscopic cholecystectomy with intraopertaive choloangiogram   No family history on file. History  Substance Use Topics  . Smoking status: Current Every Day Smoker -- 1.50 packs/day for 24 years    Types: Cigarettes  . Smokeless tobacco: Never Used  . Alcohol Use: No   OB History   Grav Para Term Preterm Abortions TAB SAB Ect Mult Living                 Review of Systems  Respiratory: Positive for cough.   Musculoskeletal: Positive for arthralgias (left arm, bilateral legs) and back pain.  All other systems reviewed and are negative.    Allergies  Eggs or egg-derived products and Ibuprofen  Home Medications   Current Outpatient Rx  Name  Route  Sig  Dispense  Refill  . Methocarbamol (ROBAXIN PO)   Oral   Take by mouth.         . ALPRAZolam (XANAX) 0.5 MG tablet   Oral   Take 1 tablet (0.5 mg total) by mouth at bedtime as needed for sleep.   30 tablet  1   . Ascorbic Acid (VITAMIN C) 1000 MG tablet   Oral   Take 1,000 mg by mouth daily.         . B Complex-C (B-COMPLEX WITH VITAMIN C) tablet   Oral   Take 1 tablet by mouth daily.           . cetirizine (ZYRTEC) 10 MG tablet   Oral   Take 10 mg by mouth daily.           . cholecalciferol (VITAMIN D) 1000 UNITS tablet   Oral   Take 2,000 Units by mouth daily.          . clobetasol cream (TEMOVATE) 0.05 %   Topical   Apply 1 application topically as needed.          Marland Kitchen escitalopram (LEXAPRO) 20 MG tablet   Oral   Take 20 mg by mouth daily. 2  20 mg tab = 40 mg daily         . folic acid (FOLVITE) 1 MG tablet   Oral   Take 1 mg by mouth daily.           Marland Kitchen gabapentin (NEURONTIN) 600 MG tablet   Oral   Take 600 mg by mouth 3 (three) times daily.           . Mesalamine (ASACOL HD) 800 MG  TBEC   Oral   Take by mouth 3 (three) times daily. 2 tabs tid         . oxyCODONE-acetaminophen (PERCOCET) 10-325 MG per tablet   Oral   Take 1 tablet by mouth every 6 (six) hours as needed.          . potassium chloride (KLOR-CON) 10 MEQ CR tablet   Oral   Take 10 mEq by mouth daily.           . promethazine (PHENERGAN) 25 MG tablet   Oral   Take 25 mg by mouth every 6 (six) hours as needed.         . ranitidine (ZANTAC) 150 MG tablet   Oral   Take 150 mg by mouth 2 (two) times daily.         . Rivaroxaban (XARELTO) 20 MG TABS tablet   Oral   Take 1 tablet (20 mg total) by mouth daily.   30 tablet   12   . Vitamins-Lipotropics (B-100 CR) TBCR      TAKE 1 TABLET BY MOUTH QD----------------------DISPENSED NATURE MADE BRAND [id number]   30 tablet   0     Please advice patient this is a 30 day courtesy re ...   . zinc gluconate 50 MG tablet   Oral   Take 50 mg by mouth daily.          Triage Vitals: BP 179/92  Pulse 88  Temp(Src) 98.7 F (37.1 C) (Oral)  Resp 20  Ht 5' 2"  (1.575 m)  Wt 203 lb (92.08 kg)  BMI 37.12 kg/m2  SpO2 100%  LMP 06/12/2013  Physical Exam  Nursing note and vitals reviewed. Constitutional: She is oriented to person, place, and time. She appears well-developed and well-nourished.  HENT:  Head: Normocephalic and atraumatic.  Mouth/Throat: Oropharynx is clear and moist.  Eyes: Conjunctivae and EOM are normal. Pupils are equal, round, and reactive to light.  Neck: Normal range of motion and phonation normal. Neck supple.  Cardiovascular: Normal rate, regular rhythm, normal heart sounds and intact distal pulses.  Exam reveals no  gallop and no friction rub.   No murmur heard. Pulmonary/Chest: Effort normal and breath sounds normal. No respiratory distress. She exhibits no tenderness.  Abdominal: Soft. She exhibits no distension. There is no tenderness. There is no guarding.  Musculoskeletal: Normal range of motion. She exhibits  no edema.  Bilateral lower extremities appear grossly normal.  No swelling.  No edema.  Homan's Sign is absent bilaterally.   Neurological: She is alert and oriented to person, place, and time. She exhibits normal muscle tone.  Skin: Skin is warm and dry.  Psychiatric: She has a normal mood and affect. Her behavior is normal. Judgment and thought content normal.    ED Course  Procedures (including critical care time)  DIAGNOSTIC STUDIES: Oxygen Saturation is 100% on room air, normal by my interpretation.    COORDINATION OF CARE: 11:26 PM- Discussed obtaining a CAT scan of the patient's abdomen and chest to rule out a clinical suspicion of a clot.  Advised the patient to return to the ED tomorrow for an ultrasound of her legs.  The patient agreed to the treatment plan.    Labs Review Labs Reviewed - No data to display Imaging Review No results found.    MDM  No diagnosis found. Patient is a 42 year old female with history of aortic thrombus, hypertension. She presents here with complaints of pain in both calves which is similar to the discomfort that she felt when she had an arterial thrombus. She is very concerned that this has occurred again even though she has been on [place] toe for quite some time. CT angiogram the chest and abdomen is negative for arterial thrombus. There is no ultrasound tonight to rule out DVT so I will bring her back tomorrow for studies of both legs. Her potassium is slightly low at 3 and this could potentially be the cause. I will prescribed some additional potassium for her and see if this helps. She is to see her doctor in the near future if not improving   I personally performed the services described in this documentation, which was scribed in my presence. The recorded information has been reviewed and is accurate.      Veryl Speak, MD 06/18/13 [phone]

## 2013-06-18 ENCOUNTER — Ambulatory Visit (HOSPITAL_BASED_OUTPATIENT_CLINIC_OR_DEPARTMENT_OTHER): Admit: 2013-06-18 | Payer: BC Managed Care – PPO

## 2013-06-18 LAB — CBC WITH DIFFERENTIAL/PLATELET
Basophils Absolute: 0.1 10*3/uL (ref 0.0–0.1)
Eosinophils Absolute: 0.5 10*3/uL (ref 0.0–0.7)
Eosinophils Relative: 3 % (ref 0–5)
HCT: 36.1 % (ref 36.0–46.0)
Lymphocytes Relative: 16 % (ref 12–46)
Lymphs Abs: 2.6 10*3/uL (ref 0.7–4.0)
MCV: 94.5 fL (ref 78.0–100.0)
Monocytes Absolute: 1 10*3/uL (ref 0.1–1.0)
Neutrophils Relative %: 74 % (ref 43–77)
RBC: 3.82 MIL/uL — ABNORMAL LOW (ref 3.87–5.11)
RDW: 18.5 % — ABNORMAL HIGH (ref 11.5–15.5)
WBC: 15.9 10*3/uL — ABNORMAL HIGH (ref 4.0–10.5)

## 2013-06-18 LAB — COMPREHENSIVE METABOLIC PANEL
AST: 27 U/L (ref 0–37)
Albumin: 3.5 g/dL (ref 3.5–5.2)
Alkaline Phosphatase: 108 U/L (ref 39–117)
BUN: 8 mg/dL (ref 6–23)
CO2: 29 mEq/L (ref 19–32)
Calcium: 9.4 mg/dL (ref 8.4–10.5)
Chloride: 101 mEq/L (ref 96–112)
Creatinine, Ser: 1 mg/dL (ref 0.50–1.10)
GFR calc non Af Amer: 67 mL/min — ABNORMAL LOW (ref >90)
Potassium: 3 mEq/L — ABNORMAL LOW (ref 3.5–5.1)
Total Bilirubin: 0.1 mg/dL — ABNORMAL LOW (ref 0.3–1.2)

## 2013-06-18 MED ORDER — POTASSIUM CHLORIDE CRYS ER 20 MEQ PO TBCR: 20.0000 meq | EXTENDED_RELEASE_TABLET | Freq: Two times a day (BID) | ORAL | Status: DC

## 2013-06-18 NOTE — ED Notes (Addendum)
C/o bilater leg and arm pain states normally takes pain meds q 4 hours  But has not had in a while

## 2013-07-16 ENCOUNTER — Ambulatory Visit (HOSPITAL_BASED_OUTPATIENT_CLINIC_OR_DEPARTMENT_OTHER)
Admission: RE | Admit: 2013-07-16 | Discharge: 2013-07-16 | Disposition: A | Discharge status: Home / Self Care | Payer: BC Managed Care – PPO | Source: Ambulatory Visit | Attending: Emergency Medicine | Admitting: Emergency Medicine

## 2013-07-16 DIAGNOSIS — M79609 Pain in unspecified limb: Secondary | ICD-10-CM | POA: Insufficient documentation

## 2013-07-16 NOTE — ED Notes (Addendum)
[place] FOLLOW UP PHONE CALL - Pt contacted with [place] results. Understood finding and verbalized understanding. Advised to follow up with PCP and to return to ED is new or other urgent concerns.

## 2013-08-12 ENCOUNTER — Ambulatory Visit: Payer: BC Managed Care – PPO

## 2013-08-12 ENCOUNTER — Other Ambulatory Visit: Payer: BC Managed Care – PPO | Admitting: Lab

## 2013-08-12 ENCOUNTER — Ambulatory Visit: Payer: BC Managed Care – PPO | Admitting: Hematology & Oncology

## 2013-08-13 ENCOUNTER — Ambulatory Visit: Payer: BC Managed Care – PPO

## 2013-08-13 ENCOUNTER — Ambulatory Visit: Payer: BC Managed Care – PPO | Admitting: Hematology & Oncology

## 2013-08-13 ENCOUNTER — Other Ambulatory Visit: Payer: BC Managed Care – PPO | Admitting: Lab

## 2013-08-13 ENCOUNTER — Telehealth: Payer: Self-pay | Admitting: Hematology & Oncology

## 2013-08-13 NOTE — Telephone Encounter (Signed)
Pt aware of 3-25 she wanted early morning

## 2013-09-17 ENCOUNTER — Ambulatory Visit (HOSPITAL_BASED_OUTPATIENT_CLINIC_OR_DEPARTMENT_OTHER): Payer: BC Managed Care – PPO | Admitting: Hematology & Oncology

## 2013-09-17 ENCOUNTER — Other Ambulatory Visit (HOSPITAL_BASED_OUTPATIENT_CLINIC_OR_DEPARTMENT_OTHER): Payer: BC Managed Care – PPO | Admitting: Lab

## 2013-09-17 ENCOUNTER — Encounter: Payer: Self-pay | Admitting: Hematology & Oncology

## 2013-09-17 VITALS — BP 137/54 | HR 78 | Temp 98.2°F | Resp 14 | Ht 64.0 in | Wt 200.0 lb

## 2013-09-17 DIAGNOSIS — D509 Iron deficiency anemia, unspecified: Secondary | ICD-10-CM

## 2013-09-17 LAB — CBC WITH DIFFERENTIAL (CANCER CENTER ONLY)
BASO#: 0.1 10e3/uL (ref 0.0–0.2)
BASO%: 0.5 % (ref 0.0–2.0)
EOS%: 3.1 % (ref 0.0–7.0)
Eosinophils Absolute: 0.5 10e3/uL (ref 0.0–0.5)
HCT: 31.5 % — ABNORMAL LOW (ref 34.8–46.6)
HGB: 9.8 g/dL — ABNORMAL LOW (ref 11.6–15.9)
LYMPH#: 2.6 10e3/uL (ref 0.9–3.3)
LYMPH%: 17.4 % (ref 14.0–48.0)
MCH: 28 pg (ref 26.0–34.0)
MCHC: 31.1 g/dL — ABNORMAL LOW (ref 32.0–36.0)
MCV: 90 fL (ref 81–101)
MONO#: 0.9 10e3/uL (ref 0.1–0.9)
MONO%: 6.1 % (ref 0.0–13.0)
NEUT#: 10.8 10e3/uL — ABNORMAL HIGH (ref 1.5–6.5)
NEUT%: 72.9 % (ref 39.6–80.0)
Platelets: 509 10e3/uL — ABNORMAL HIGH (ref 145–400)
RBC: 3.5 10e6/uL — ABNORMAL LOW (ref 3.70–5.32)
RDW: 17.1 % — ABNORMAL HIGH (ref 11.1–15.7)
WBC: 14.8 10e3/uL — ABNORMAL HIGH (ref 3.9–10.0)

## 2013-09-17 LAB — IRON AND TIBC CHCC
%SAT: 4 % — ABNORMAL LOW (ref 21–57)
Iron: 19 ug/dL — ABNORMAL LOW (ref 41–142)
TIBC: 441 ug/dL (ref 236–444)
UIBC: 422 ug/dL — ABNORMAL HIGH (ref 120–384)

## 2013-09-17 LAB — CHCC SATELLITE - SMEAR

## 2013-09-17 LAB — FERRITIN CHCC: Ferritin: 11 ng/mL (ref 9–269)

## 2013-09-17 NOTE — Patient Instructions (Signed)
You Can Quit Smoking If you are ready to quit smoking or are thinking about it, congratulations! You have chosen to help yourself be healthier and live longer! There are lots of different ways to quit smoking. Nicotine gum, nicotine patches, a nicotine inhaler, or nicotine nasal spray can help with physical craving. Hypnosis, support groups, and medicines help break the habit of smoking. TIPS TO GET OFF AND STAY OFF CIGARETTES  Learn to predict your moods. Do not let a bad situation be your excuse to have a cigarette. Some situations in your life might tempt you to have a cigarette.  Ask friends and co-workers not to smoke around you.  Make your home smoke-free.  Never have "just one" cigarette. It leads to wanting another and another. Remind yourself of your decision to quit.  On a card, make a list of your reasons for not smoking. Read it at least the same number of times a day as you have a cigarette. Tell yourself everyday, "I do not want to smoke. I choose not to smoke."  Ask someone at home or work to help you with your plan to quit smoking.  Have something planned after you eat or have a cup of coffee. Take a walk or get other exercise to perk you up. This will help to keep you from overeating.  Try a relaxation exercise to calm you down and decrease your stress. Remember, you may be tense and nervous the first two weeks after you quit. This will pass.  Find new activities to keep your hands busy. Play with a pen, coin, or rubber band. Doodle or draw things on paper.  Brush your teeth right after eating. This will help cut down the craving for the taste of tobacco after meals. You can try mouthwash too.  Try gum, breath mints, or diet candy to keep something in your mouth. IF YOU SMOKE AND WANT TO QUIT:  Do not stock up on cigarettes. Never buy a carton. Wait until one pack is finished before you buy another.  Never carry cigarettes with you at work or at home.  Keep cigarettes  as far away from you as possible. Leave them with someone else.  Never carry matches or a lighter with you.  Ask yourself, "Do I need this cigarette or is this just a reflex?"  Bet with someone that you can quit. Put cigarette money in a piggy bank every morning. If you smoke, you give up the money. If you do not smoke, by the end of the week, you keep the money.  Keep trying. It takes 21 days to change a habit!  Talk to your doctor about using medicines to help you quit. These include nicotine replacement gum, lozenges, or skin patches. Document Released: 04/08/2009 Document Revised: 09/04/2011 Document Reviewed: 04/08/2009 [employer] Patient Information 2014 [employer].  
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
## 2013-09-29 ENCOUNTER — Ambulatory Visit (HOSPITAL_BASED_OUTPATIENT_CLINIC_OR_DEPARTMENT_OTHER): Payer: BC Managed Care – PPO

## 2013-09-29 VITALS — BP 126/71 | HR 89 | Temp 98.5°F | Resp 16

## 2013-09-29 DIAGNOSIS — D509 Iron deficiency anemia, unspecified: Secondary | ICD-10-CM

## 2013-09-29 MED ORDER — SODIUM CHLORIDE 0.9 % IV SOLN
1020.0000 mg | Freq: Once | INTRAVENOUS | Status: AC
  Administered 2013-09-29: 1020 mg via INTRAVENOUS
  Filled 2013-09-29: qty 34

## 2013-12-18 ENCOUNTER — Encounter: Payer: Self-pay | Admitting: Hematology & Oncology

## 2013-12-18 ENCOUNTER — Other Ambulatory Visit (HOSPITAL_BASED_OUTPATIENT_CLINIC_OR_DEPARTMENT_OTHER): Payer: BC Managed Care – PPO | Admitting: Lab

## 2013-12-18 ENCOUNTER — Ambulatory Visit (HOSPITAL_BASED_OUTPATIENT_CLINIC_OR_DEPARTMENT_OTHER): Payer: BC Managed Care – PPO | Admitting: Hematology & Oncology

## 2013-12-18 VITALS — BP 127/73 | HR 89 | Temp 98.1°F | Resp 18 | Ht 62.0 in | Wt 196.0 lb

## 2013-12-18 DIAGNOSIS — I7411 Embolism and thrombosis of thoracic aorta: Secondary | ICD-10-CM

## 2013-12-18 DIAGNOSIS — Z7901 Long term (current) use of anticoagulants: Secondary | ICD-10-CM

## 2013-12-18 DIAGNOSIS — D509 Iron deficiency anemia, unspecified: Secondary | ICD-10-CM

## 2013-12-18 DIAGNOSIS — D510 Vitamin B12 deficiency anemia due to intrinsic factor deficiency: Secondary | ICD-10-CM

## 2013-12-18 DIAGNOSIS — D473 Essential (hemorrhagic) thrombocythemia: Secondary | ICD-10-CM

## 2013-12-18 DIAGNOSIS — D75839 Thrombocytosis, unspecified: Secondary | ICD-10-CM

## 2013-12-18 LAB — RETICULOCYTES (CHCC)
ABS Retic: 61.6 10*3/uL (ref 19.0–186.0)
RBC.: 3.85 MIL/uL — ABNORMAL LOW (ref 3.87–5.11)
Retic Ct Pct: 1.6 % (ref 0.4–2.3)

## 2013-12-18 LAB — CBC WITH DIFFERENTIAL (CANCER CENTER ONLY)
BASO#: 0.1 10*3/uL (ref 0.0–0.2)
BASO%: 0.7 % (ref 0.0–2.0)
EOS ABS: 0.5 10*3/uL (ref 0.0–0.5)
EOS%: 3.2 % (ref 0.0–7.0)
HEMATOCRIT: 34.1 % — AB (ref 34.8–46.6)
HGB: 10.3 g/dL — ABNORMAL LOW (ref 11.6–15.9)
LYMPH#: 1.9 10*3/uL (ref 0.9–3.3)
LYMPH%: 12.5 % — ABNORMAL LOW (ref 14.0–48.0)
MCH: 27 pg (ref 26.0–34.0)
MCHC: 30.2 g/dL — ABNORMAL LOW (ref 32.0–36.0)
MCV: 90 fL (ref 81–101)
MONO#: 0.9 10*3/uL (ref 0.1–0.9)
MONO%: 6.1 % (ref 0.0–13.0)
NEUT#: 11.5 10*3/uL — ABNORMAL HIGH (ref 1.5–6.5)
NEUT%: 77.5 % (ref 39.6–80.0)
PLATELETS: 602 10*3/uL — AB (ref 145–400)
RBC: 3.81 10*6/uL (ref 3.70–5.32)
RDW: 20.5 % — AB (ref 11.1–15.7)
WBC: 14.9 10*3/uL — AB (ref 3.9–10.0)

## 2013-12-18 LAB — IRON AND TIBC CHCC
%SAT: 4 % — AB (ref 21–57)
IRON: 14 ug/dL — AB (ref 41–142)
TIBC: 403 ug/dL (ref 236–444)
UIBC: 388 ug/dL — ABNORMAL HIGH (ref 120–384)

## 2013-12-18 LAB — FERRITIN CHCC: FERRITIN: 12 ng/mL (ref 9–269)

## 2013-12-19 ENCOUNTER — Telehealth: Payer: Self-pay | Admitting: Hematology & Oncology

## 2013-12-19 NOTE — Progress Notes (Signed)
Hematology and Oncology Follow Up Visit  BREALYN BARIL [id number] 12/08/1970 43 y.o. 12/19/2013   Principle Diagnosis:  1. Idiopathic aortic thrombosis. 2. Pernicious anemia. 3. Iron-deficiency anemia. 4. Ulcerative colitis.  Current Therapy:   1. Xarelto 20 mg p.o. q. day. 2. Vitamin B12 1 mg IM every month. 3. IV iron as needed.     Interim History:  Ms.  Purrington is back for followup. Unfortunately, she has or family problems. Her husband wants to refer. This really has upset her.  She is smoking more now.  She's not having any pain problems. She's having no bleeding problems. She is on Xarelto and doing well.  There is no cough. There's no change in bowel or bladder habits. She's had no leg swelling. There's been no rashes.  Medications: Current outpatient prescriptions:loratadine (CLARITIN) 10 MG tablet, Take 10 mg by mouth daily as needed for allergies., Disp: , Rfl: ;  ALPRAZolam (XANAX) 0.5 MG tablet, Take 1 tablet (0.5 mg total) by mouth at bedtime as needed for sleep., Disp: 30 tablet, Rfl: 1;  amLODipine-valsartan (EXFORGE) 5-160 MG per tablet, Take 1 tablet by mouth as needed. , Disp: , Rfl:  Ascorbic Acid (VITAMIN C) 1000 MG tablet, Take 1,000 mg by mouth daily., Disp: , Rfl: ;  baclofen (LIORESAL) 10 MG tablet, Take 10 mg by mouth as needed. , Disp: , Rfl: ;  cetirizine (ZYRTEC) 10 MG tablet, Take 10 mg by mouth daily.  , Disp: , Rfl: ;  cholecalciferol (VITAMIN D) 1000 UNITS tablet, Take 2,000 Units by mouth daily. , Disp: , Rfl: ;  clobetasol cream (TEMOVATE) 6.29 %, Apply 1 application topically as needed. , Disp: , Rfl:  escitalopram (LEXAPRO) 20 MG tablet, Take 20 mg by mouth daily. 2  20 mg tab = 40 mg daily, Disp: , Rfl: ;  folic acid (FOLVITE) 1 MG tablet, Take 1 mg by mouth daily.  , Disp: , Rfl: ;  gabapentin (NEURONTIN) 600 MG tablet, Take 600 mg by mouth 3 (three) times daily.  , Disp: , Rfl: ;  Mesalamine (ASACOL HD) 800 MG TBEC, Take by mouth 3 (three) times  daily. 2 tabs tid, Disp: , Rfl: ;  Methocarbamol (ROBAXIN PO), Take by mouth., Disp: , Rfl:  oxyCODONE-acetaminophen (PERCOCET) 10-325 MG per tablet, Take 1 tablet by mouth every 6 (six) hours as needed. , Disp: , Rfl: ;  potassium chloride SA (K-DUR,KLOR-CON) 20 MEQ tablet, Take 20 mEq by mouth daily., Disp: , Rfl: ;  promethazine (PHENERGAN) 25 MG tablet, Take 25 mg by mouth every 6 (six) hours as needed., Disp: , Rfl: ;  Rivaroxaban (XARELTO) 20 MG TABS tablet, Take 1 tablet (20 mg total) by mouth daily., Disp: 30 tablet, Rfl: 12 Vitamins-Lipotropics (B-100 CR) TBCR, TAKE 1 TABLET BY MOUTH QD----------------------DISPENSED NATURE MADE BRAND [id number], Disp: 30 tablet, Rfl: 0;  zinc gluconate 50 MG tablet, Take 50 mg by mouth daily., Disp: , Rfl:   Allergies:  Allergies  Allergen Reactions  . Eggs Or Egg-Derived Products   . Ibuprofen     Past Medical History, Surgical history, Social history, and Family History were reviewed and updated.  Review of Systems: As above  Physical Exam:  height is 5\' 2"  (1.575 m) and weight is 196 lb (88.905 kg). Her oral temperature is 98.1 F (36.7 C). Her blood pressure is 127/73 and her pulse is 89. Her respiration is 18.   Well-developed and well-nourished white female. Head and neck exam shows no ocular or oral  was. Shows no palpable cervical or supraclavicular lymph nodes. Lungs are clear. Cardiac exam regular rate and rhythm. Abdomen soft. She is mildly obese. There is no fluid wave. There is no palpable liver or spleen tip. Back exam no tenderness over the spine ribs or hips. Extremities shows no clubbing cyanosis or edema. Skin exam no rashes. Neurological exam no focal deficits.  Lab Results  Component Value Date   WBC 14.9* 12/18/2013   HGB 10.3* 12/18/2013   HCT 34.1* 12/18/2013   MCV 90 12/18/2013   PLT 602* 12/18/2013     Chemistry      Component Value Date/Time   NA 143 06/18/2013 0030   NA 141 12/08/2011 1145   K 3.0* 06/18/2013 0030    K 4.3 12/08/2011 1145   CL 101 06/18/2013 0030   CL 106 12/08/2011 1145   CO2 29 06/18/2013 0030   CO2 27 12/08/2011 1145   BUN 8 06/18/2013 0030   BUN 9 12/08/2011 1145   CREATININE 1.00 06/18/2013 0030   CREATININE 1.1 12/08/2011 1145      Component Value Date/Time   CALCIUM 9.4 06/18/2013 0030   CALCIUM 9.0 12/08/2011 1145   ALKPHOS 108 06/18/2013 0030   ALKPHOS 69 12/08/2011 1145   AST 27 06/18/2013 0030   AST 30 12/08/2011 1145   ALT 24 06/18/2013 0030   ALT 29 12/08/2011 1145   BILITOT 0.1* 06/18/2013 0030   BILITOT 0.40 12/08/2011 1145      Ferritin is 12. Iron saturation 4%. Total iron 14.   Impression and Plan: Ms. Diguglielmo is 43 year old female. She has an idiopathic aortic thrombosis. She is on lifelong Xarelto. It has been 4 years since she was done have this. All hypocortical studies were negative.   She is artificial again. We will have to give her a dose of IV iron.  We will pray for her and her family situation.  We will have her come back to see [place] in another couple of months.  Volanda Napoleon, MD 6/26/20157:48 AM
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
## 2014-01-30 ENCOUNTER — Ambulatory Visit (HOSPITAL_BASED_OUTPATIENT_CLINIC_OR_DEPARTMENT_OTHER): Payer: BC Managed Care – PPO

## 2014-01-30 VITALS — BP 120/66 | HR 83 | Temp 96.9°F | Resp 20

## 2014-01-30 DIAGNOSIS — D510 Vitamin B12 deficiency anemia due to intrinsic factor deficiency: Secondary | ICD-10-CM

## 2014-01-30 DIAGNOSIS — D509 Iron deficiency anemia, unspecified: Secondary | ICD-10-CM

## 2014-01-30 MED ORDER — SODIUM CHLORIDE 0.9 % IV SOLN
Freq: Once | INTRAVENOUS | Status: AC
  Administered 2014-01-30: 11:00:00 via INTRAVENOUS

## 2014-01-30 MED ORDER — SODIUM CHLORIDE 0.9 % IV SOLN
1020.0000 mg | Freq: Once | INTRAVENOUS | Status: AC
  Administered 2014-01-30: 1020 mg via INTRAVENOUS
  Filled 2014-01-30: qty 34

## 2014-01-30 MED ORDER — CYANOCOBALAMIN 1000 MCG/ML IJ SOLN: 1000.0000 ug | Freq: Once | INTRAMUSCULAR | Status: DC

## 2014-01-30 NOTE — Patient Instructions (Signed)

## 2014-02-03 ENCOUNTER — Encounter: Payer: Self-pay | Admitting: *Deleted

## 2014-02-03 NOTE — Progress Notes (Signed)
Per Dr. Marin Olp,  Last MD note and lab's faxed to Dr Patric Dykes

## 2014-02-19 ENCOUNTER — Other Ambulatory Visit: Payer: BC Managed Care – PPO | Admitting: Lab

## 2014-02-19 ENCOUNTER — Ambulatory Visit: Payer: BC Managed Care – PPO | Admitting: Hematology & Oncology

## 2014-02-27 ENCOUNTER — Other Ambulatory Visit: Payer: Self-pay | Admitting: Hematology & Oncology

## 2014-04-10 ENCOUNTER — Other Ambulatory Visit: Payer: Self-pay | Admitting: Hematology & Oncology

## 2014-05-16 ENCOUNTER — Other Ambulatory Visit: Payer: Self-pay | Admitting: Hematology & Oncology

## 2014-06-24 ENCOUNTER — Telehealth: Payer: Self-pay | Admitting: Hematology & Oncology

## 2014-06-24 NOTE — Telephone Encounter (Signed)
Pt aware of 1-12 appointment and per Roselyn Reef RN pt is aware to have lab results faxed to [place]

## 2014-06-29 ENCOUNTER — Emergency Department (HOSPITAL_BASED_OUTPATIENT_CLINIC_OR_DEPARTMENT_OTHER): Payer: No Typology Code available for payment source

## 2014-06-29 ENCOUNTER — Emergency Department (HOSPITAL_BASED_OUTPATIENT_CLINIC_OR_DEPARTMENT_OTHER)
Admission: EM | Admit: 2014-06-29 | Discharge: 2014-06-29 | Disposition: A | Discharge status: Home / Self Care | Payer: No Typology Code available for payment source | Attending: Emergency Medicine | Admitting: Emergency Medicine

## 2014-06-29 ENCOUNTER — Encounter (HOSPITAL_BASED_OUTPATIENT_CLINIC_OR_DEPARTMENT_OTHER): Payer: Self-pay | Admitting: *Deleted

## 2014-06-29 DIAGNOSIS — Z720 Tobacco use: Secondary | ICD-10-CM | POA: Insufficient documentation

## 2014-06-29 DIAGNOSIS — K5090 Crohn's disease, unspecified, without complications: Secondary | ICD-10-CM | POA: Diagnosis not present

## 2014-06-29 DIAGNOSIS — Y92410 Unspecified street and highway as the place of occurrence of the external cause: Secondary | ICD-10-CM | POA: Diagnosis not present

## 2014-06-29 DIAGNOSIS — F419 Anxiety disorder, unspecified: Secondary | ICD-10-CM | POA: Insufficient documentation

## 2014-06-29 DIAGNOSIS — G629 Polyneuropathy, unspecified: Secondary | ICD-10-CM | POA: Insufficient documentation

## 2014-06-29 DIAGNOSIS — I10 Essential (primary) hypertension: Secondary | ICD-10-CM | POA: Insufficient documentation

## 2014-06-29 DIAGNOSIS — Z79899 Other long term (current) drug therapy: Secondary | ICD-10-CM | POA: Insufficient documentation

## 2014-06-29 DIAGNOSIS — S3992XA Unspecified injury of lower back, initial encounter: Secondary | ICD-10-CM | POA: Insufficient documentation

## 2014-06-29 DIAGNOSIS — D649 Anemia, unspecified: Secondary | ICD-10-CM | POA: Diagnosis not present

## 2014-06-29 DIAGNOSIS — R079 Chest pain, unspecified: Secondary | ICD-10-CM

## 2014-06-29 DIAGNOSIS — S161XXA Strain of muscle, fascia and tendon at neck level, initial encounter: Secondary | ICD-10-CM | POA: Diagnosis not present

## 2014-06-29 DIAGNOSIS — S299XXA Unspecified injury of thorax, initial encounter: Secondary | ICD-10-CM | POA: Insufficient documentation

## 2014-06-29 DIAGNOSIS — Y998 Other external cause status: Secondary | ICD-10-CM | POA: Insufficient documentation

## 2014-06-29 DIAGNOSIS — Y9389 Activity, other specified: Secondary | ICD-10-CM | POA: Diagnosis not present

## 2014-06-29 DIAGNOSIS — M549 Dorsalgia, unspecified: Secondary | ICD-10-CM

## 2014-06-29 MED ORDER — OXYCODONE-ACETAMINOPHEN 5-325 MG PO TABS
2.0000 | ORAL_TABLET | Freq: Once | ORAL | Status: AC
  Administered 2014-06-29: 2 via ORAL
  Filled 2014-06-29: qty 2

## 2014-06-29 NOTE — Discharge Instructions (Signed)
Back Pain, Adult Low back pain is very common. About 1 in 5 people have back pain.The cause of low back pain is rarely dangerous. The pain often gets better over time.About half of people with a sudden onset of back pain feel better in just 2 weeks. About 8 in 10 people feel better by 6 weeks.  CAUSES Some common causes of back pain include:  Strain of the muscles or ligaments supporting the spine.  Wear and tear (degeneration) of the spinal discs.  Arthritis.  Direct injury to the back. DIAGNOSIS Most of the time, the direct cause of low back pain is not known.However, back pain can be treated effectively even when the exact cause of the pain is unknown.Answering your caregiver's questions about your overall health and symptoms is one of the most accurate ways to make sure the cause of your pain is not dangerous. If your caregiver needs more information, he or she may order lab work or imaging tests (X-rays or MRIs).However, even if imaging tests show changes in your back, this usually does not require surgery. HOME CARE INSTRUCTIONS For many people, back pain returns.Since low back pain is rarely dangerous, it is often a condition that people can learn to [facility] their own.   Remain active. It is stressful on the back to sit or stand in one place. Do not sit, drive, or stand in one place for more than 30 minutes at a time. Take short walks on level surfaces as soon as pain allows.Try to increase the length of time you walk each day.  Do not stay in bed.Resting more than 1 or 2 days can delay your recovery.  Do not avoid exercise or work.Your body is made to move.It is not dangerous to be active, even though your back may hurt.Your back will likely heal faster if you return to being active before your pain is gone.  Pay attention to your body when you bend and lift. Many people have less discomfortwhen lifting if they bend their knees, keep the load close to their bodies,and  avoid twisting. Often, the most comfortable positions are those that put less stress on your recovering back.  Find a comfortable position to sleep. Use a firm mattress and lie on your side with your knees slightly bent. If you lie on your back, put a pillow under your knees.  Only take over-the-counter or prescription medicines as directed by your caregiver. Over-the-counter medicines to reduce pain and inflammation are often the most helpful.Your caregiver may prescribe muscle relaxant drugs.These medicines help dull your pain so you can more quickly return to your normal activities and healthy exercise.  Put ice on the injured area.  Put ice in a plastic bag.  Place a towel between your skin and the bag.  Leave the ice on for 15-20 minutes, 03-04 times a day for the first 2 to 3 days. After that, ice and heat may be alternated to reduce pain and spasms.  Ask your caregiver about trying back exercises and gentle massage. This may be of some benefit.  Avoid feeling anxious or stressed.Stress increases muscle tension and can worsen back pain.It is important to recognize when you are anxious or stressed and learn ways to manage it.Exercise is a great option. SEEK MEDICAL CARE IF:  You have pain that is not relieved with rest or medicine.  You have pain that does not improve in 1 week.  You have new symptoms.  You are generally not feeling well. SEEK  IMMEDIATE MEDICAL CARE IF:   You have pain that radiates from your back into your legs.  You develop new bowel or bladder control problems.  You have unusual weakness or numbness in your arms or legs.  You develop nausea or vomiting.  You develop abdominal pain.  You feel faint. Document Released: 06/12/2005 Document Revised: 12/12/2011 Document Reviewed: 10/14/2013 [facility] Patient Information 2015 [place], [place]. This information is not intended to replace advice given to you by your health care provider. Make sure you  discuss any questions you have with your health care provider.  Cervical Sprain A cervical sprain is an injury in the neck in which the strong, fibrous tissues (ligaments) that connect your neck bones stretch or tear. Cervical sprains can range from mild to severe. Severe cervical sprains can cause the neck vertebrae to be unstable. This can lead to damage of the spinal cord and can result in serious nervous system problems. The amount of time it takes for a cervical sprain to get better depends on the cause and extent of the injury. Most cervical sprains heal in 1 to 3 weeks. CAUSES  Severe cervical sprains may be caused by:   Contact sport injuries (such as from football, rugby, wrestling, hockey, auto racing, gymnastics, diving, martial arts, or boxing).   Motor vehicle collisions.   Whiplash injuries. This is an injury from a sudden forward and backward whipping movement of the head and neck.  Falls.  Mild cervical sprains may be caused by:   Being in an awkward position, such as while cradling a telephone between your ear and shoulder.   Sitting in a chair that does not offer proper support.   Working at a poorly Landscape architect station.   Looking up or down for long periods of time.  SYMPTOMS   Pain, soreness, stiffness, or a burning sensation in the front, back, or sides of the neck. This discomfort may develop immediately after the injury or slowly, 24 hours or more after the injury.   Pain or tenderness directly in the middle of the back of the neck.   Shoulder or upper back pain.   Limited ability to move the neck.   Headache.   Dizziness.   Weakness, numbness, or tingling in the hands or arms.   Muscle spasms.   Difficulty swallowing or chewing.   Tenderness and swelling of the neck.  DIAGNOSIS  Most of the time your health care provider can diagnose a cervical sprain by taking your history and doing a physical exam. Your health care  provider will ask about previous neck injuries and any known neck problems, such as arthritis in the neck. X-rays may be taken to find out if there are any other problems, such as with the bones of the neck. Other tests, such as a CT scan or MRI, may also be needed.  TREATMENT  Treatment depends on the severity of the cervical sprain. Mild sprains can be treated with rest, keeping the neck in place (immobilization), and pain medicines. Severe cervical sprains are immediately immobilized. Further treatment is done to help with pain, muscle spasms, and other symptoms and may include:  Medicines, such as pain relievers, numbing medicines, or muscle relaxants.   Physical therapy. This may involve stretching exercises, strengthening exercises, and posture training. Exercises and improved posture can help stabilize the neck, strengthen muscles, and help stop symptoms from returning.  HOME CARE INSTRUCTIONS   Put ice on the injured area.   Put ice in a  plastic bag.   Place a towel between your skin and the bag.   Leave the ice on for 15-20 minutes, 3-4 times a day.   If your injury was severe, you may have been given a cervical collar to wear. A cervical collar is a two-piece collar designed to keep your neck from moving while it heals.  Do not remove the collar unless instructed by your health care provider.  If you have long hair, keep it outside of the collar.  Ask your health care provider before making any adjustments to your collar. Minor adjustments may be required over time to improve comfort and reduce pressure on your chin or on the back of your head.  Ifyou are allowed to remove the collar for cleaning or bathing, follow your health care provider's instructions on how to do so safely.  Keep your collar clean by wiping it with mild soap and water and drying it completely. If the collar you have been given includes removable pads, remove them every 1-2 days and hand wash them with  soap and water. Allow them to air dry. They should be completely dry before you wear them in the collar.  If you are allowed to remove the collar for cleaning and bathing, wash and dry the skin of your neck. Check your skin for irritation or sores. If you see any, tell your health care provider.  Do not drive while wearing the collar.   Only take over-the-counter or prescription medicines for pain, discomfort, or fever as directed by your health care provider.   Keep all follow-up appointments as directed by your health care provider.   Keep all physical therapy appointments as directed by your health care provider.   Make any needed adjustments to your workstation to promote good posture.   Avoid positions and activities that make your symptoms worse.   Warm up and stretch before being active to help prevent problems.  SEEK MEDICAL CARE IF:   Your pain is not controlled with medicine.   You are unable to decrease your pain medicine over time as planned.   Your activity level is not improving as expected.  SEEK IMMEDIATE MEDICAL CARE IF:   You develop any bleeding.  You develop stomach upset.  You have signs of an allergic reaction to your medicine.   Your symptoms get worse.   You develop new, unexplained symptoms.   You have numbness, tingling, weakness, or paralysis in any part of your body.  MAKE SURE YOU:   Understand these instructions.  Will watch your condition.  Will get help right away if you are not doing well or get worse. Document Released: 04/09/2007 Document Revised: 06/17/2013 Document Reviewed: 12/18/2012 [facility] Patient Information 2015 [place], [place]. This information is not intended to replace advice given to you by your health care provider. Make sure you discuss any questions you have with your health care provider.  Motor Vehicle Collision It is common to have multiple bruises and sore muscles after a motor vehicle collision  (MVC). These tend to feel worse for the first 24 hours. You may have the most stiffness and soreness over the first several hours. You may also feel worse when you wake up the first morning after your collision. After this point, you will usually begin to improve with each day. The speed of improvement often depends on the severity of the collision, the number of injuries, and the location and nature of these injuries. HOME CARE INSTRUCTIONS  Put ice  on the injured area.  Put ice in a plastic bag.  Place a towel between your skin and the bag.  Leave the ice on for 15-20 minutes, 3-4 times a day, or as directed by your health care provider.  Drink enough fluids to keep your urine clear or pale yellow. Do not drink alcohol.  Take a warm shower or bath once or twice a day. This will increase blood flow to sore muscles.  You may return to activities as directed by your caregiver. Be careful when lifting, as this may aggravate neck or back pain.  Only take over-the-counter or prescription medicines for pain, discomfort, or fever as directed by your caregiver. Do not use aspirin. This may increase bruising and bleeding. SEEK IMMEDIATE MEDICAL CARE IF:  You have numbness, tingling, or weakness in the arms or legs.  You develop severe headaches not relieved with medicine.  You have severe neck pain, especially tenderness in the middle of the back of your neck.  You have changes in bowel or bladder control.  There is increasing pain in any area of the body.  You have shortness of breath, light-headedness, dizziness, or fainting.  You have chest pain.  You feel sick to your stomach (nauseous), throw up (vomit), or sweat.  You have increasing abdominal discomfort.  There is blood in your urine, stool, or vomit.  You have pain in your shoulder (shoulder strap areas).  You feel your symptoms are getting worse. MAKE SURE YOU:  Understand these instructions.  Will watch your  condition.  Will get help right away if you are not doing well or get worse. Document Released: 06/12/2005 Document Revised: 10/27/2013 Document Reviewed: 11/09/2010 [facility] Patient Information 2015 [place], [place]. This information is not intended to replace advice given to you by your health care provider. Make sure you discuss any questions you have with your health care provider.

## 2014-06-29 NOTE — ED Notes (Signed)
Back from radiology, lying in stretcher, NAD, calm, c-collar remains in place. Repositioned for comfort in stretcher.

## 2014-06-29 NOTE — ED Notes (Signed)
Pt sitting in chair, ambulated to stretcher, alert, NAD, calm, interactive, c/o pain, no changes, c-collar remains in place, states, "c-collar causing more pain".
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
## 2014-06-29 NOTE — ED Notes (Signed)
Dr. Tamera Punt in to speak with & update pt with results and plan of care, c-collar removed.
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
## 2014-06-29 NOTE — ED Notes (Signed)
Pt to CT

## 2014-06-30 ENCOUNTER — Ambulatory Visit (HOSPITAL_BASED_OUTPATIENT_CLINIC_OR_DEPARTMENT_OTHER): Payer: BLUE CROSS/BLUE SHIELD

## 2014-06-30 ENCOUNTER — Ambulatory Visit (HOSPITAL_BASED_OUTPATIENT_CLINIC_OR_DEPARTMENT_OTHER): Payer: BLUE CROSS/BLUE SHIELD | Admitting: Family

## 2014-06-30 ENCOUNTER — Other Ambulatory Visit (HOSPITAL_BASED_OUTPATIENT_CLINIC_OR_DEPARTMENT_OTHER): Payer: BLUE CROSS/BLUE SHIELD | Admitting: Lab

## 2014-06-30 ENCOUNTER — Encounter: Payer: Self-pay | Admitting: Family

## 2014-06-30 DIAGNOSIS — I7411 Embolism and thrombosis of thoracic aorta: Secondary | ICD-10-CM

## 2014-06-30 DIAGNOSIS — D509 Iron deficiency anemia, unspecified: Secondary | ICD-10-CM

## 2014-06-30 DIAGNOSIS — D510 Vitamin B12 deficiency anemia due to intrinsic factor deficiency: Secondary | ICD-10-CM

## 2014-06-30 DIAGNOSIS — D473 Essential (hemorrhagic) thrombocythemia: Secondary | ICD-10-CM

## 2014-06-30 DIAGNOSIS — Z7901 Long term (current) use of anticoagulants: Secondary | ICD-10-CM

## 2014-06-30 DIAGNOSIS — D75839 Thrombocytosis, unspecified: Secondary | ICD-10-CM

## 2014-06-30 LAB — TECHNOLOGIST REVIEW CHCC SATELLITE

## 2014-06-30 LAB — CBC WITH DIFFERENTIAL (CANCER CENTER ONLY)
BASO#: 0 10*3/uL (ref 0.0–0.2)
BASO%: 0.2 % (ref 0.0–2.0)
EOS%: 0.7 % (ref 0.0–7.0)
Eosinophils Absolute: 0.1 10*3/uL (ref 0.0–0.5)
HEMATOCRIT: 23.5 % — AB (ref 34.8–46.6)
HGB: 6.5 g/dL — CL (ref 11.6–15.9)
LYMPH#: 4.5 10*3/uL — ABNORMAL HIGH (ref 0.9–3.3)
LYMPH%: 24.4 % (ref 14.0–48.0)
MCH: 20.2 pg — ABNORMAL LOW (ref 26.0–34.0)
MCHC: 27.7 g/dL — ABNORMAL LOW (ref 32.0–36.0)
MCV: 73 fL — ABNORMAL LOW (ref 81–101)
MONO#: 1.5 10*3/uL — ABNORMAL HIGH (ref 0.1–0.9)
MONO%: 8 % (ref 0.0–13.0)
NEUT#: 12.3 10*3/uL — ABNORMAL HIGH (ref 1.5–6.5)
NEUT%: 66.7 % (ref 39.6–80.0)
Platelets: 729 10*3/uL — ABNORMAL HIGH (ref 145–400)
RBC: 3.21 10*6/uL — ABNORMAL LOW (ref 3.70–5.32)
RDW: 18.9 % — ABNORMAL HIGH (ref 11.1–15.7)
WBC: 18.5 10*3/uL — ABNORMAL HIGH (ref 3.9–10.0)

## 2014-06-30 LAB — IRON AND TIBC CHCC
%SAT: 2 % — AB (ref 21–57)
Iron: 12 ug/dL — ABNORMAL LOW (ref 41–142)
TIBC: 536 ug/dL — ABNORMAL HIGH (ref 236–444)
UIBC: 524 ug/dL — ABNORMAL HIGH (ref 120–384)

## 2014-06-30 LAB — CHCC SATELLITE - SMEAR

## 2014-06-30 LAB — FERRITIN CHCC: Ferritin: 6 ng/ml — ABNORMAL LOW (ref 9–269)

## 2014-06-30 MED ORDER — FERUMOXYTOL INJECTION 510 MG/17 ML: 510.0000 mg | Freq: Once | INTRAVENOUS | Status: DC

## 2014-06-30 MED ORDER — CYANOCOBALAMIN 1000 MCG/ML IJ SOLN
INTRAMUSCULAR | Status: AC
  Filled 2014-06-30: qty 1

## 2014-06-30 MED ORDER — RIVAROXABAN 20 MG PO TABS: 20.0000 mg | ORAL_TABLET | Freq: Every day | ORAL | Status: DC

## 2014-06-30 MED ORDER — SODIUM CHLORIDE 0.9 % IV SOLN
510.0000 mg | Freq: Once | INTRAVENOUS | Status: AC
  Administered 2014-06-30: 510 mg via INTRAVENOUS
  Filled 2014-06-30: qty 17

## 2014-06-30 MED ORDER — CYANOCOBALAMIN 1000 MCG/ML IJ SOLN
1000.0000 ug | Freq: Once | INTRAMUSCULAR | Status: AC
  Administered 2014-06-30: 1000 ug via INTRAMUSCULAR
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
## 2014-06-30 NOTE — Patient Instructions (Signed)
Cyanocobalamin, Vitamin B12 injection What is this medicine? CYANOCOBALAMIN (sye an oh koe BAL a min) is a man made form of vitamin B12. Vitamin B12 is used in the growth of healthy blood cells, nerve cells, and proteins in the body. It also helps with the metabolism of fats and carbohydrates. This medicine is used to treat people who can not absorb vitamin B12. This medicine may be used for other purposes; ask your health care provider or pharmacist if you have questions. COMMON BRAND NAME(S): Cyomin, LA-12, Nutri-Twelve, Primabalt What should I tell my health care provider before I take this medicine? They need to know if you have any of these conditions: -kidney disease -Leber's disease -megaloblastic anemia -an unusual or allergic reaction to cyanocobalamin, cobalt, other medicines, foods, dyes, or preservatives -pregnant or trying to get pregnant -breast-feeding How should I use this medicine? This medicine is injected into a muscle or deeply under the skin. It is usually given by a health care professional in a clinic or doctor's office. However, your doctor may teach you how to inject yourself. Follow all instructions. Talk to your pediatrician regarding the use of this medicine in children. Special care may be needed. Overdosage: If you think you have taken too much of this medicine contact a poison control center or emergency room at once. NOTE: This medicine is only for you. Do not share this medicine with others. What if I miss a dose? If you are given your dose at a clinic or doctor's office, call to reschedule your appointment. If you give your own injections and you miss a dose, take it as soon as you can. If it is almost time for your next dose, take only that dose. Do not take double or extra doses. What may interact with this medicine? -colchicine -heavy alcohol intake This list may not describe all possible interactions. Give your health care provider a list of all the  medicines, herbs, non-prescription drugs, or dietary supplements you use. Also tell them if you smoke, drink alcohol, or use illegal drugs. Some items may interact with your medicine. What should I watch for while using this medicine? Visit your doctor or health care professional regularly. You may need blood work done while you are taking this medicine. You may need to follow a special diet. Talk to your doctor. Limit your alcohol intake and avoid smoking to get the best benefit. What side effects may I notice from receiving this medicine? Side effects that you should report to your doctor or health care professional as soon as possible: -allergic reactions like skin rash, itching or hives, swelling of the face, lips, or tongue -blue tint to skin -chest tightness, pain -difficulty breathing, wheezing -dizziness -red, swollen painful area on the leg Side effects that usually do not require medical attention (report to your doctor or health care professional if they continue or are bothersome): -diarrhea -headache This list may not describe all possible side effects. Call your doctor for medical advice about side effects. You may report side effects to FDA at 1-800-FDA-1088. Where should I keep my medicine? Keep out of the reach of children. Store at room temperature between 15 and 30 degrees C (59 and 85 degrees F). Protect from light. Throw away any unused medicine after the expiration date. NOTE: This sheet is a summary. It may not cover all possible information. If you have questions about this medicine, talk to your doctor, pharmacist, or health care provider.  2015, Elsevier/Gold Standard. (2007-09-23 22:10:20) Ferumoxytol  injection What is this medicine? FERUMOXYTOL is an iron complex. Iron is used to make healthy red blood cells, which carry oxygen and nutrients throughout the body. This medicine is used to treat iron deficiency anemia in people with chronic kidney disease. This  medicine may be used for other purposes; ask your health care provider or pharmacist if you have questions. COMMON BRAND NAME(S): Feraheme What should I tell my health care provider before I take this medicine? They need to know if you have any of these conditions: -anemia not caused by low iron levels -high levels of iron in the blood -magnetic resonance imaging (MRI) test scheduled -an unusual or allergic reaction to iron, other medicines, foods, dyes, or preservatives -pregnant or trying to get pregnant -breast-feeding How should I use this medicine? This medicine is for injection into a vein. It is given by a health care professional in a hospital or clinic setting. Talk to your pediatrician regarding the use of this medicine in children. Special care may be needed. Overdosage: If you think you've taken too much of this medicine contact a poison control center or emergency room at once. Overdosage: If you think you have taken too much of this medicine contact a poison control center or emergency room at once. NOTE: This medicine is only for you. Do not share this medicine with others. What if I miss a dose? It is important not to miss your dose. Call your doctor or health care professional if you are unable to keep an appointment. What may interact with this medicine? This medicine may interact with the following medications: -other iron products This list may not describe all possible interactions. Give your health care provider a list of all the medicines, herbs, non-prescription drugs, or dietary supplements you use. Also tell them if you smoke, drink alcohol, or use illegal drugs. Some items may interact with your medicine. What should I watch for while using this medicine? Visit your doctor or healthcare professional regularly. Tell your doctor or healthcare professional if your symptoms do not start to get better or if they get worse. You may need blood work done while you are taking  this medicine. You may need to follow a special diet. Talk to your doctor. Foods that contain iron include: whole grains/cereals, dried fruits, beans, or peas, leafy green vegetables, and organ meats (liver, kidney). What side effects may I notice from receiving this medicine? Side effects that you should report to your doctor or health care professional as soon as possible: -allergic reactions like skin rash, itching or hives, swelling of the face, lips, or tongue -breathing problems -changes in blood pressure -feeling faint or lightheaded, falls -fever or chills -flushing, sweating, or hot feelings -swelling of the ankles or feet Side effects that usually do not require medical attention (Report these to your doctor or health care professional if they continue or are bothersome.): -diarrhea -headache -nausea, vomiting -stomach pain This list may not describe all possible side effects. Call your doctor for medical advice about side effects. You may report side effects to FDA at 1-800-FDA-1088. Where should I keep my medicine? This drug is given in a hospital or clinic and will not be stored at home. NOTE: This sheet is a summary. It may not cover all possible information. If you have questions about this medicine, talk to your doctor, pharmacist, or health care provider.  2015, Elsevier/Gold Standard. (2012-01-26 15:23:36)

## 2014-07-04 LAB — RETICULOCYTES (CHCC)
ABS Retic: 60.6 10*3/uL (ref 19.0–186.0)
RBC.: 3.19 MIL/uL — ABNORMAL LOW (ref 3.87–5.11)
RETIC CT PCT: 1.9 % (ref 0.4–2.3)

## 2014-07-04 LAB — CALRETICULIN (CALR) MUTATION ANALYSIS: CALR Mutation (exon 9): NOT DETECTED

## 2014-07-04 LAB — VITAMIN B12: Vitamin B-12: 1261 pg/mL — ABNORMAL HIGH (ref 211–911)

## 2014-07-07 ENCOUNTER — Other Ambulatory Visit: Payer: BC Managed Care – PPO | Admitting: Lab

## 2014-07-07 ENCOUNTER — Ambulatory Visit: Payer: BC Managed Care – PPO | Admitting: Hematology & Oncology

## 2014-07-08 ENCOUNTER — Ambulatory Visit (HOSPITAL_BASED_OUTPATIENT_CLINIC_OR_DEPARTMENT_OTHER): Payer: BLUE CROSS/BLUE SHIELD

## 2014-07-08 VITALS — BP 136/65 | HR 90 | Temp 98.7°F | Resp 20

## 2014-07-08 DIAGNOSIS — D509 Iron deficiency anemia, unspecified: Secondary | ICD-10-CM

## 2014-07-08 MED ORDER — SODIUM CHLORIDE 0.9 % IV SOLN
510.0000 mg | Freq: Once | INTRAVENOUS | Status: AC
  Administered 2014-07-08: 510 mg via INTRAVENOUS
  Filled 2014-07-08: qty 17

## 2014-07-08 MED ORDER — SODIUM CHLORIDE 0.9 % IV SOLN
Freq: Once | INTRAVENOUS | Status: AC
  Administered 2014-07-08: 11:00:00 via INTRAVENOUS

## 2014-07-08 NOTE — Patient Instructions (Signed)

## 2014-07-28 ENCOUNTER — Encounter: Payer: Self-pay | Admitting: Family

## 2014-07-28 ENCOUNTER — Ambulatory Visit (HOSPITAL_BASED_OUTPATIENT_CLINIC_OR_DEPARTMENT_OTHER): Payer: BLUE CROSS/BLUE SHIELD | Admitting: Family

## 2014-07-28 ENCOUNTER — Other Ambulatory Visit (HOSPITAL_BASED_OUTPATIENT_CLINIC_OR_DEPARTMENT_OTHER): Payer: BLUE CROSS/BLUE SHIELD | Admitting: Lab

## 2014-07-28 VITALS — BP 163/74 | HR 71 | Temp 98.2°F | Resp 16 | Ht 62.0 in | Wt 197.0 lb

## 2014-07-28 DIAGNOSIS — D509 Iron deficiency anemia, unspecified: Secondary | ICD-10-CM

## 2014-07-28 DIAGNOSIS — D510 Vitamin B12 deficiency anemia due to intrinsic factor deficiency: Secondary | ICD-10-CM

## 2014-07-28 DIAGNOSIS — I7419 Embolism and thrombosis of other parts of aorta: Secondary | ICD-10-CM

## 2014-07-28 DIAGNOSIS — Z7901 Long term (current) use of anticoagulants: Secondary | ICD-10-CM

## 2014-07-28 LAB — CBC WITH DIFFERENTIAL (CANCER CENTER ONLY)
BASO#: 0 10*3/uL (ref 0.0–0.2)
BASO%: 0.2 % (ref 0.0–2.0)
EOS%: 1 % (ref 0.0–7.0)
Eosinophils Absolute: 0.1 10*3/uL (ref 0.0–0.5)
HCT: 36.9 % (ref 34.8–46.6)
HGB: 10.8 g/dL — ABNORMAL LOW (ref 11.6–15.9)
LYMPH#: 1.9 10*3/uL (ref 0.9–3.3)
LYMPH%: 14.3 % (ref 14.0–48.0)
MCH: 26.5 pg (ref 26.0–34.0)
MCHC: 29.3 g/dL — ABNORMAL LOW (ref 32.0–36.0)
MCV: 91 fL (ref 81–101)
MONO#: 1.1 10*3/uL — ABNORMAL HIGH (ref 0.1–0.9)
MONO%: 7.8 % (ref 0.0–13.0)
NEUT#: 10.4 10*3/uL — ABNORMAL HIGH (ref 1.5–6.5)
NEUT%: 76.7 % (ref 39.6–80.0)
PLATELETS: 486 10*3/uL — AB (ref 145–400)
RBC: 4.07 10*6/uL (ref 3.70–5.32)
WBC: 13.6 10*3/uL — AB (ref 3.9–10.0)

## 2014-07-28 LAB — IRON AND TIBC CHCC
%SAT: 7 % — ABNORMAL LOW (ref 21–57)
IRON: 28 ug/dL — AB (ref 41–142)
TIBC: 394 ug/dL (ref 236–444)
UIBC: 366 ug/dL (ref 120–384)

## 2014-07-28 LAB — FERRITIN CHCC: Ferritin: 81 ng/ml (ref 9–269)

## 2014-07-28 LAB — CHCC SATELLITE - SMEAR
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
## 2014-07-29 LAB — VITAMIN B12: Vitamin B-12: >2000 pg/mL — ABNORMAL HIGH (ref 211–911)

## 2014-07-29 LAB — RETICULOCYTES (CHCC)
ABS RETIC: 75.8 10*3/uL (ref 19.0–186.0)
RBC.: 3.99 MIL/uL (ref 3.87–5.11)
Retic Ct Pct: 1.9 % (ref 0.4–2.3)

## 2014-09-28 ENCOUNTER — Other Ambulatory Visit (HOSPITAL_BASED_OUTPATIENT_CLINIC_OR_DEPARTMENT_OTHER): Payer: BLUE CROSS/BLUE SHIELD

## 2014-09-28 ENCOUNTER — Ambulatory Visit (HOSPITAL_BASED_OUTPATIENT_CLINIC_OR_DEPARTMENT_OTHER): Payer: BLUE CROSS/BLUE SHIELD | Admitting: Family

## 2014-09-28 VITALS — BP 134/67 | HR 89 | Temp 97.7°F | Wt 188.0 lb

## 2014-09-28 DIAGNOSIS — Z7901 Long term (current) use of anticoagulants: Secondary | ICD-10-CM | POA: Diagnosis not present

## 2014-09-28 DIAGNOSIS — I7410 Embolism and thrombosis of unspecified parts of aorta: Secondary | ICD-10-CM | POA: Diagnosis not present

## 2014-09-28 DIAGNOSIS — D509 Iron deficiency anemia, unspecified: Secondary | ICD-10-CM

## 2014-09-28 DIAGNOSIS — D510 Vitamin B12 deficiency anemia due to intrinsic factor deficiency: Secondary | ICD-10-CM

## 2014-09-28 LAB — CBC WITH DIFFERENTIAL (CANCER CENTER ONLY)
BASO#: 0.1 10*3/uL (ref 0.0–0.2)
BASO%: 0.6 % (ref 0.0–2.0)
EOS ABS: 0.5 10*3/uL (ref 0.0–0.5)
EOS%: 2.9 % (ref 0.0–7.0)
HEMATOCRIT: 37.2 % (ref 34.8–46.6)
HEMOGLOBIN: 11.3 g/dL — AB (ref 11.6–15.9)
LYMPH#: 2.3 10*3/uL (ref 0.9–3.3)
LYMPH%: 13.4 % — AB (ref 14.0–48.0)
MCH: 25.9 pg — AB (ref 26.0–34.0)
MCHC: 30.4 g/dL — ABNORMAL LOW (ref 32.0–36.0)
MCV: 85 fL (ref 81–101)
MONO#: 0.9 10*3/uL (ref 0.1–0.9)
MONO%: 5.2 % (ref 0.0–13.0)
NEUT#: 13.2 10*3/uL — ABNORMAL HIGH (ref 1.5–6.5)
NEUT%: 77.9 % (ref 39.6–80.0)
Platelets: 641 10*3/uL — ABNORMAL HIGH (ref 145–400)
RBC: 4.37 10*6/uL (ref 3.70–5.32)
RDW: 22.6 % — ABNORMAL HIGH (ref 11.1–15.7)
WBC: 17 10*3/uL — ABNORMAL HIGH (ref 3.9–10.0)

## 2014-09-28 LAB — RETICULOCYTES (CHCC)
ABS Retic: 62.7 10*3/uL (ref 19.0–186.0)
RBC.: 4.48 MIL/uL (ref 3.87–5.11)
Retic Ct Pct: 1.4 % (ref 0.4–2.3)

## 2014-09-28 LAB — CHCC SATELLITE - SMEAR

## 2014-09-28 LAB — VITAMIN B12: Vitamin B-12: 1988 pg/mL — ABNORMAL HIGH (ref 211–911)
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
## 2014-09-29 LAB — IRON AND TIBC CHCC
%SAT: 6 % — ABNORMAL LOW (ref 21–57)
Iron: 21 ug/dL — ABNORMAL LOW (ref 41–142)
TIBC: 375 ug/dL (ref 236–444)
UIBC: 354 ug/dL (ref 120–384)

## 2014-09-29 LAB — FERRITIN CHCC: FERRITIN: 14 ng/mL (ref 9–269)

## 2014-10-07 ENCOUNTER — Other Ambulatory Visit: Payer: Self-pay | Admitting: Family

## 2014-10-07 ENCOUNTER — Telehealth: Payer: Self-pay | Admitting: Hematology & Oncology

## 2014-10-07 DIAGNOSIS — D509 Iron deficiency anemia, unspecified: Secondary | ICD-10-CM

## 2014-10-07 MED ORDER — FERUMOXYTOL INJECTION 510 MG/17 ML
510.0000 mg | Freq: Once | INTRAVENOUS | Status: DC
Start: 2014-10-08 — End: 2014-10-07

## 2014-10-07 NOTE — Addendum Note (Signed)
Addended by: Gerrit Halls T on: 10/07/2014 09:00 AM   Modules accepted: Orders

## 2014-10-07 NOTE — Telephone Encounter (Signed)
Pt and Tara aware of 4-20 Iron infusion

## 2014-10-14 ENCOUNTER — Ambulatory Visit (HOSPITAL_BASED_OUTPATIENT_CLINIC_OR_DEPARTMENT_OTHER): Payer: BLUE CROSS/BLUE SHIELD

## 2014-10-14 DIAGNOSIS — D509 Iron deficiency anemia, unspecified: Secondary | ICD-10-CM | POA: Diagnosis not present

## 2014-10-14 MED ORDER — SODIUM CHLORIDE 0.9 % IV SOLN
Freq: Once | INTRAVENOUS | Status: AC
  Administered 2014-10-14: 10:00:00 via INTRAVENOUS

## 2014-10-14 MED ORDER — FERUMOXYTOL INJECTION 510 MG/17 ML
510.0000 mg | Freq: Once | INTRAVENOUS | Status: AC
  Administered 2014-10-14: 510 mg via INTRAVENOUS
  Filled 2014-10-14: qty 17

## 2014-10-14 NOTE — Patient Instructions (Signed)

## 2014-12-21 ENCOUNTER — Other Ambulatory Visit (HOSPITAL_BASED_OUTPATIENT_CLINIC_OR_DEPARTMENT_OTHER): Payer: BLUE CROSS/BLUE SHIELD

## 2014-12-21 ENCOUNTER — Ambulatory Visit (HOSPITAL_BASED_OUTPATIENT_CLINIC_OR_DEPARTMENT_OTHER): Payer: BLUE CROSS/BLUE SHIELD | Admitting: Hematology & Oncology

## 2014-12-21 ENCOUNTER — Encounter: Payer: Self-pay | Admitting: Hematology & Oncology

## 2014-12-21 VITALS — BP 152/82 | HR 84 | Temp 98.1°F | Resp 18 | Ht 62.0 in | Wt 188.0 lb

## 2014-12-21 DIAGNOSIS — D510 Vitamin B12 deficiency anemia due to intrinsic factor deficiency: Secondary | ICD-10-CM

## 2014-12-21 DIAGNOSIS — D509 Iron deficiency anemia, unspecified: Secondary | ICD-10-CM

## 2014-12-21 DIAGNOSIS — I7410 Embolism and thrombosis of unspecified parts of aorta: Secondary | ICD-10-CM

## 2014-12-21 LAB — RETICULOCYTES (CHCC)
ABS Retic: 88.8 10*3/uL (ref 19.0–186.0)
RBC.: 3.7 MIL/uL — ABNORMAL LOW (ref 3.87–5.11)
Retic Ct Pct: 2.4 % — ABNORMAL HIGH (ref 0.4–2.3)

## 2014-12-21 LAB — CBC WITH DIFFERENTIAL (CANCER CENTER ONLY)
BASO#: 0 10*3/uL (ref 0.0–0.2)
BASO%: 0.2 % (ref 0.0–2.0)
EOS%: 0.9 % (ref 0.0–7.0)
Eosinophils Absolute: 0.2 10*3/uL (ref 0.0–0.5)
HCT: 29.6 % — ABNORMAL LOW (ref 34.8–46.6)
HGB: 8.9 g/dL — ABNORMAL LOW (ref 11.6–15.9)
LYMPH#: 1.6 10*3/uL (ref 0.9–3.3)
LYMPH%: 7.4 % — ABNORMAL LOW (ref 14.0–48.0)
MCH: 25.4 pg — ABNORMAL LOW (ref 26.0–34.0)
MCHC: 30.1 g/dL — ABNORMAL LOW (ref 32.0–36.0)
MCV: 85 fL (ref 81–101)
MONO#: 1.3 10*3/uL — ABNORMAL HIGH (ref 0.1–0.9)
MONO%: 5.9 % (ref 0.0–13.0)
NEUT#: 18 10*3/uL — ABNORMAL HIGH (ref 1.5–6.5)
NEUT%: 85.6 % — ABNORMAL HIGH (ref 39.6–80.0)
PLATELETS: 672 10*3/uL — AB (ref 145–400)
RBC: 3.5 10*6/uL — ABNORMAL LOW (ref 3.70–5.32)
RDW: 22.6 % — AB (ref 11.1–15.7)
WBC: 21 10*3/uL — ABNORMAL HIGH (ref 3.9–10.0)

## 2014-12-21 LAB — VITAMIN B12: Vitamin B-12: 1076 pg/mL — ABNORMAL HIGH (ref 211–911)

## 2014-12-21 LAB — CHCC SATELLITE - SMEAR

## 2014-12-21 NOTE — Progress Notes (Signed)
Hematology and Oncology Follow Up Visit  Kari Hahn [id number] 12-26-1970 44 y.o. 12/21/2014   Principle Diagnosis:  1. Idiopathic aortic thrombosis. 2. Pernicious anemia. 3. Iron-deficiency anemia. 4. Ulcerative colitis.  Current Therapy:   1. Xarelto 20 mg p.o. q. day. 2. Vitamin B12 1 mg IM every month. 3. IV iron as needed.     Interim History:  [name] is back for followup. She is doing pretty well. She is having a flareup of her ulcerative colitis.  She said that she got a Escherichia coli urinary tract infection that may have triggered this.  Thankfully, her situation at home seems to be a little better.  She has had no obvious bleeding.  She is doing well with the Xarelto.  I not sure when her last B-12 was. She might be doing this at home. If her last iron was given back in April area and we are checking her iron studies today.  Her appetite has been doing okay. Per she's had no fever. She's had no cough. Again, she is smoking but is cutting back. She says that she will be stopped by the end of the year.   Medications:  Current outpatient prescriptions:  .  amLODipine (NORVASC) 2.5 MG tablet, , Disp: , Rfl:  .  valACYclovir (VALTREX) 500 MG tablet, Take 500 mg by mouth., Disp: , Rfl:  .  ALPRAZolam (XANAX) 0.5 MG tablet, Take 1 tablet (0.5 mg total) by mouth at bedtime as needed for sleep., Disp: 30 tablet, Rfl: 1 .  Ascorbic Acid (VITAMIN C) 1000 MG tablet, Take 1,000 mg by mouth daily., Disp: , Rfl:  .  baclofen (LIORESAL) 10 MG tablet, Take 10 mg by mouth as needed. 07-28-14  NOW TAKING 20 MG QID, Disp: , Rfl:  .  Cholecalciferol (VITAMIN D3) 5000 UNITS TABS, Take by mouth every morning., Disp: , Rfl:  .  Cyanocobalamin (VITAMIN B 12 PO), Take by mouth 2 (two) times daily., Disp: , Rfl:  .  escitalopram (LEXAPRO) 20 MG tablet, Take 20 mg by mouth daily. 2  20 mg tab = 40 mg daily, Disp: , Rfl:  .  folic acid (FOLVITE) 1 MG tablet, Take 1 mg by mouth daily.   , Disp: , Rfl:  .  gabapentin (NEURONTIN) 600 MG tablet, Take 600 mg by mouth 3 (three) times daily.  , Disp: , Rfl:  .  loratadine (CLARITIN) 10 MG tablet, Take 10 mg by mouth daily as needed for allergies., Disp: , Rfl:  .  Mesalamine (ASACOL HD) 800 MG TBEC, Take by mouth 3 (three) times daily. 2 tabs tid, Disp: , Rfl:  .  mirabegron ER (MYRBETRIQ) 50 MG TB24 tablet, Take 50 mg by mouth daily., Disp: , Rfl:  .  omeprazole (PRILOSEC) 40 MG capsule, Take 40 mg by mouth daily., Disp: , Rfl:  .  oxyCODONE-acetaminophen (PERCOCET) 10-325 MG per tablet, Take 1 tablet by mouth every 6 (six) hours as needed. , Disp: , Rfl:  .  PENTASA 500 MG CR capsule, TK 2 CS PO QID FOR CROHNS, Disp: , Rfl: 5 .  potassium chloride SA (K-DUR,KLOR-CON) 20 MEQ tablet, Take 20 mEq by mouth daily., Disp: , Rfl:  .  predniSONE (DELTASONE) 10 MG tablet, TAKE AS DIRECTED WITH FOOD, Disp: , Rfl: 0 .  promethazine (PHENERGAN) 25 MG tablet, Take 25 mg by mouth every 6 (six) hours as needed., Disp: , Rfl:  .  rivaroxaban (XARELTO) 20 MG TABS tablet, Take 1 tablet (  20 mg total) by mouth daily., Disp: 30 tablet, Rfl: 3 .  Zinc 50 MG TABS, TK 1 T PO D, Disp: , Rfl: 12 .  zinc gluconate 50 MG tablet, Take 50 mg by mouth daily., Disp: , Rfl:   Allergies:  Allergies  Allergen Reactions  . Eggs Or Egg-Derived Products   . Ibuprofen     Past Medical History, Surgical history, Social history, and Family History were reviewed and updated.  Review of Systems: As above  Physical Exam:  height is 5' 2"  (1.575 m) and weight is 188 lb (85.276 kg). Her oral temperature is 98.1 F (36.7 C). Her blood pressure is 152/82 and her pulse is 84. Her respiration is 18.   Well-developed and well-nourished white female. Head and neck exam shows no ocular or oral was. Shows no palpable cervical or supraclavicular lymph nodes. Lungs are clear. Cardiac exam regular rate and rhythm. Abdomen soft. She is mildly obese. There is no fluid wave. There  is no palpable liver or spleen tip. Back exam no tenderness over the spine ribs or hips. Extremities shows no clubbing cyanosis or edema. Skin exam no rashes. Neurological exam no focal deficits.  Lab Results  Component Value Date   WBC 21.0* 12/21/2014   HGB 8.9* 12/21/2014   HCT 29.6* 12/21/2014   MCV 85 12/21/2014   PLT 672* 12/21/2014     Chemistry      Component Value Date/Time   NA 143 06/18/2013 0030   NA 141 12/08/2011 1145   K 3.0* 06/18/2013 0030   K 4.3 12/08/2011 1145   CL 101 06/18/2013 0030   CL 106 12/08/2011 1145   CO2 29 06/18/2013 0030   CO2 27 12/08/2011 1145   BUN 8 06/18/2013 0030   BUN 9 12/08/2011 1145   CREATININE 1.00 06/18/2013 0030   CREATININE 1.1 12/08/2011 1145      Component Value Date/Time   CALCIUM 9.4 06/18/2013 0030   CALCIUM 9.0 12/08/2011 1145   ALKPHOS 108 06/18/2013 0030   ALKPHOS 69 12/08/2011 1145   AST 27 06/18/2013 0030   AST 30 12/08/2011 1145   ALT 24 06/18/2013 0030   ALT 29 12/08/2011 1145   BILITOT 0.1* 06/18/2013 0030   BILITOT 0.40 12/08/2011 1145     .   Impression and Plan: Kari Hahn is 44 year old female. She has an idiopathic aortic thrombosis. She is on lifelong Xarelto. It has been 4 years since she was done have this. All follow-up studies were negative.   I'm sure that she will need iron again.  I'll not sure what her last vitamin B-12 shot was. I did this will also help her.  She's still smoking. She's trying to cut back on smoking.  I will plan to her back to see [place] in another 6 weeks.     Volanda Napoleon, MD 6/27/201612:59 PM

## 2014-12-22 LAB — IRON AND TIBC CHCC
%SAT: 3 % — ABNORMAL LOW (ref 21–57)
IRON: 14 ug/dL — AB (ref 41–142)
TIBC: 450 ug/dL — AB (ref 236–444)
UIBC: 436 ug/dL — ABNORMAL HIGH (ref 120–384)

## 2014-12-22 LAB — FERRITIN CHCC: FERRITIN: 10 ng/mL (ref 9–269)

## 2014-12-24 ENCOUNTER — Other Ambulatory Visit: Payer: Self-pay | Admitting: Family

## 2014-12-25 ENCOUNTER — Telehealth: Payer: Self-pay | Admitting: Hematology & Oncology

## 2014-12-25 NOTE — Telephone Encounter (Signed)
Contact pt regarding iron appt and Pt confirmed appt

## 2014-12-30 DIAGNOSIS — M25569 Pain in unspecified knee: Secondary | ICD-10-CM | POA: Insufficient documentation

## 2014-12-30 DIAGNOSIS — M9420 Chondromalacia, unspecified site: Secondary | ICD-10-CM | POA: Insufficient documentation

## 2014-12-30 DIAGNOSIS — S134XXA Sprain of ligaments of cervical spine, initial encounter: Secondary | ICD-10-CM | POA: Insufficient documentation

## 2014-12-31 ENCOUNTER — Ambulatory Visit (HOSPITAL_BASED_OUTPATIENT_CLINIC_OR_DEPARTMENT_OTHER): Payer: BLUE CROSS/BLUE SHIELD

## 2014-12-31 ENCOUNTER — Other Ambulatory Visit: Payer: Self-pay | Admitting: Family

## 2014-12-31 VITALS — BP 157/75 | HR 77 | Temp 98.1°F | Resp 20

## 2014-12-31 DIAGNOSIS — D510 Vitamin B12 deficiency anemia due to intrinsic factor deficiency: Secondary | ICD-10-CM

## 2014-12-31 DIAGNOSIS — D509 Iron deficiency anemia, unspecified: Secondary | ICD-10-CM

## 2014-12-31 MED ORDER — FERUMOXYTOL INJECTION 510 MG/17 ML
510.0000 mg | Freq: Once | INTRAVENOUS | Status: AC
Start: 2014-12-31 — End: 2014-12-31
  Administered 2014-12-31: 510 mg via INTRAVENOUS
  Filled 2014-12-31: qty 17

## 2014-12-31 MED ORDER — CYANOCOBALAMIN 1000 MCG/ML IJ SOLN: 1000.0000 ug | Freq: Once | INTRAMUSCULAR | Status: DC

## 2014-12-31 NOTE — Patient Instructions (Signed)

## 2015-01-07 ENCOUNTER — Ambulatory Visit (HOSPITAL_BASED_OUTPATIENT_CLINIC_OR_DEPARTMENT_OTHER): Payer: BLUE CROSS/BLUE SHIELD

## 2015-01-07 ENCOUNTER — Other Ambulatory Visit: Payer: Self-pay | Admitting: Family

## 2015-01-07 VITALS — BP 138/77 | HR 75 | Temp 98.2°F | Resp 20

## 2015-01-07 DIAGNOSIS — D509 Iron deficiency anemia, unspecified: Secondary | ICD-10-CM | POA: Diagnosis not present

## 2015-01-07 MED ORDER — SODIUM CHLORIDE 0.9 % IV SOLN
510.0000 mg | Freq: Once | INTRAVENOUS | Status: AC
Start: 2015-01-07 — End: 2015-01-07
  Administered 2015-01-07: 510 mg via INTRAVENOUS
  Filled 2015-01-07: qty 17

## 2015-01-07 NOTE — Patient Instructions (Signed)

## 2015-01-25 ENCOUNTER — Other Ambulatory Visit: Payer: Self-pay | Admitting: Family Medicine

## 2015-01-25 DIAGNOSIS — R200 Anesthesia of skin: Secondary | ICD-10-CM

## 2015-01-25 DIAGNOSIS — M542 Cervicalgia: Secondary | ICD-10-CM

## 2015-01-28 ENCOUNTER — Ambulatory Visit
Admission: RE | Admit: 2015-01-28 | Discharge: 2015-01-28 | Disposition: A | Discharge status: Home / Self Care | Payer: BLUE CROSS/BLUE SHIELD | Source: Ambulatory Visit | Attending: Family Medicine | Admitting: Family Medicine

## 2015-01-28 DIAGNOSIS — M542 Cervicalgia: Secondary | ICD-10-CM

## 2015-01-28 DIAGNOSIS — R200 Anesthesia of skin: Secondary | ICD-10-CM

## 2015-02-02 ENCOUNTER — Other Ambulatory Visit: Payer: Self-pay | Admitting: Family

## 2015-02-22 ENCOUNTER — Other Ambulatory Visit: Payer: BLUE CROSS/BLUE SHIELD

## 2015-02-22 ENCOUNTER — Telehealth: Payer: Self-pay | Admitting: Family

## 2015-02-22 ENCOUNTER — Ambulatory Visit: Payer: BLUE CROSS/BLUE SHIELD | Admitting: Family

## 2015-02-22 NOTE — Telephone Encounter (Signed)
Returned pts call using both numbers but unable to leave a message.

## 2015-10-20 ENCOUNTER — Ambulatory Visit (HOSPITAL_BASED_OUTPATIENT_CLINIC_OR_DEPARTMENT_OTHER): Payer: BLUE CROSS/BLUE SHIELD | Admitting: Family

## 2015-10-20 ENCOUNTER — Ambulatory Visit (HOSPITAL_BASED_OUTPATIENT_CLINIC_OR_DEPARTMENT_OTHER): Payer: BLUE CROSS/BLUE SHIELD

## 2015-10-20 ENCOUNTER — Encounter: Payer: Self-pay | Admitting: Family

## 2015-10-20 ENCOUNTER — Other Ambulatory Visit (HOSPITAL_BASED_OUTPATIENT_CLINIC_OR_DEPARTMENT_OTHER): Payer: BLUE CROSS/BLUE SHIELD

## 2015-10-20 VITALS — BP 124/68 | HR 79 | Resp 20

## 2015-10-20 VITALS — BP 136/69 | HR 86 | Temp 97.6°F | Resp 20 | Ht 62.0 in | Wt 173.0 lb

## 2015-10-20 DIAGNOSIS — D509 Iron deficiency anemia, unspecified: Secondary | ICD-10-CM

## 2015-10-20 DIAGNOSIS — D500 Iron deficiency anemia secondary to blood loss (chronic): Secondary | ICD-10-CM | POA: Diagnosis not present

## 2015-10-20 DIAGNOSIS — D510 Vitamin B12 deficiency anemia due to intrinsic factor deficiency: Secondary | ICD-10-CM

## 2015-10-20 DIAGNOSIS — K5190 Ulcerative colitis, unspecified, without complications: Secondary | ICD-10-CM

## 2015-10-20 DIAGNOSIS — I7410 Embolism and thrombosis of unspecified parts of aorta: Secondary | ICD-10-CM | POA: Diagnosis not present

## 2015-10-20 LAB — CBC WITH DIFFERENTIAL (CANCER CENTER ONLY)
BASO#: 0.1 10*3/uL (ref 0.0–0.2)
BASO%: 0.4 % (ref 0.0–2.0)
EOS ABS: 0.2 10*3/uL (ref 0.0–0.5)
EOS%: 0.9 % (ref 0.0–7.0)
HCT: 32.3 % — ABNORMAL LOW (ref 34.8–46.6)
HEMOGLOBIN: 9 g/dL — AB (ref 11.6–15.9)
LYMPH#: 2.7 10*3/uL (ref 0.9–3.3)
LYMPH%: 15 % (ref 14.0–48.0)
MCH: 20.9 pg — ABNORMAL LOW (ref 26.0–34.0)
MCHC: 27.9 g/dL — ABNORMAL LOW (ref 32.0–36.0)
MCV: 75 fL — ABNORMAL LOW (ref 81–101)
MONO#: 1.1 10*3/uL — ABNORMAL HIGH (ref 0.1–0.9)
MONO%: 6.3 % (ref 0.0–13.0)
NEUT%: 77.4 % (ref 39.6–80.0)
NEUTROS ABS: 13.9 10*3/uL — AB (ref 1.5–6.5)
Platelets: 846 10*3/uL — ABNORMAL HIGH (ref 145–400)
RBC: 4.31 10*6/uL (ref 3.70–5.32)
RDW: 22.1 % — AB (ref 11.1–15.7)
WBC: 17.9 10*3/uL — AB (ref 3.9–10.0)

## 2015-10-20 LAB — FERRITIN: Ferritin: 11 ng/ml (ref 9–269)

## 2015-10-20 LAB — IRON AND TIBC: TIBC: 413 ug/dL (ref 236–444)

## 2015-10-20 MED ORDER — SODIUM CHLORIDE 0.9 % IV SOLN
510.0000 mg | Freq: Once | INTRAVENOUS | Status: AC
  Administered 2015-10-20: 510 mg via INTRAVENOUS
  Filled 2015-10-20: qty 17

## 2015-10-20 MED ORDER — SODIUM CHLORIDE 0.9 % IV SOLN
Freq: Once | INTRAVENOUS | Status: AC
  Administered 2015-10-20: 11:00:00 via INTRAVENOUS

## 2015-10-20 NOTE — Patient Instructions (Signed)

## 2015-10-20 NOTE — Progress Notes (Signed)
Hematology and Oncology Follow Up Visit  Kari Hahn [id number] [birth year] 45 y.o. 10/20/2015   Principle Diagnosis:  1. Idiopathic aortic thrombosis 2. Pernicious anemia 3. Iron-deficiency anemia 4. Ulcerative colitis  Current Therapy:   1. Vitamin B12 1 mg IM every month 2. IV iron as needed    Interim History: Kari Hahn is here today for a follow-up. She has had an ongoing flare with her UC that has lasted several months. She states that she is currently on a Prednisone taper.  She is symptomatic with dark tarry stools, fatigue, dizziness, occasional palpitations and numbness and tingling in her hands and feet that comes and goes.  She states that she had a colonoscopy last month and that the results showed areas of irritation and bleeding from the colitis.  Her Hgb is 9.0 with and MCV of 75. Her platelet count is quite elevated at 846.  She has been off of Xarelto for quite some time due to menorrhagia.  No fever, chills, n/v, cough, rash, headache, SOB, chest pain, abdominal pain or changes in bladder habits.  No swelling, tenderness, numbness or tingling in her extremities. No new aches or pains.  Her appetite has been down but she is staying well hydrated. Her weight is down 15 lbs since we saw her last year.  She is still smoking 2 ppd but is actively trying to wean herself off completely.   Medications:    Medication List       This list is accurate as of: 10/20/15 10:37 AM.  Always use your most recent med list.               ALPRAZolam 0.5 MG tablet  Commonly known as:  XANAX  Take 1 tablet (0.5 mg total) by mouth at bedtime as needed for sleep.     baclofen 10 MG tablet  Commonly known as:  LIORESAL  Take 10 mg by mouth as needed. 07-28-14  NOW TAKING 20 MG QID     folic acid 1 MG tablet  Commonly known as:  FOLVITE  Take 1 mg by mouth daily.     gabapentin 600 MG tablet  Commonly known as:  NEURONTIN  Take 600 mg by mouth 3 (three) times daily.     LEXAPRO 20 MG tablet  Generic drug:  escitalopram  Take 20 mg by mouth daily. 2  20 mg tab = 40 mg daily     omeprazole 40 MG capsule  Commonly known as:  PRILOSEC  Take 40 mg by mouth daily.     oxyCODONE-acetaminophen 10-325 MG tablet  Commonly known as:  PERCOCET  Take 1 tablet by mouth every 6 (six) hours as needed.     PENTASA 500 MG CR capsule  Generic drug:  mesalamine  TK 2 CS PO QID FOR CROHNS     potassium chloride SA 20 MEQ tablet  Commonly known as:  K-DUR,KLOR-CON  Take 20 mEq by mouth daily.     predniSONE 10 MG tablet  Commonly known as:  DELTASONE  TAKE AS DIRECTED WITH FOOD     promethazine 25 MG tablet  Commonly known as:  PHENERGAN  Take 25 mg by mouth every 6 (six) hours as needed.     VITAMIN B 12 PO  Take by mouth 2 (two) times daily.     vitamin C 1000 MG tablet  Take 1,000 mg by mouth daily.     Vitamin D3 5000 units Tabs  Take by mouth every morning.  Zinc 50 MG Tabs  TK 1 T PO D     zinc gluconate 50 MG tablet  Take 50 mg by mouth daily.        Allergies:  Allergies  Allergen Reactions  . Eggs Or Egg-Derived Products   . Ibuprofen     Past Medical History, Surgical history, Social history, and Family History were reviewed and updated.  Review of Systems: All other 10 point review of systems is negative.   Physical Exam:  height is 5\' 2"  (1.575 m) and weight is 173 lb (78.472 kg). Her oral temperature is 97.6 F (36.4 C). Her blood pressure is 136/69 and her pulse is 86. Her respiration is 20.   Wt Readings from Last 3 Encounters:  10/20/15 173 lb (78.472 kg)  12/21/14 188 lb (85.276 kg)  09/28/14 188 lb (85.276 kg)    Ocular: Sclerae unicteric, pupils equal, round and reactive to light Ear-nose-throat: Oropharynx clear, dentition fair Lymphatic: No cervical supraclavicular or axillary adenopathy Lungs no rales or rhonchi, good excursion bilaterally Heart regular rate and rhythm, no murmur appreciated Abd soft,  nontender, positive bowel sounds, no liver or spleen tip palpated on exam, no fluid wave MSK no focal spinal tenderness, no joint edema Neuro: non-focal, well-oriented, appropriate affect Breasts: Deferred  Lab Results  Component Value Date   WBC 21.0* 12/21/2014   HGB 8.9* 12/21/2014   HCT 29.6* 12/21/2014   MCV 85 12/21/2014   PLT 672* 12/21/2014   Lab Results  Component Value Date   FERRITIN 10 12/21/2014   IRON 14* 12/21/2014   TIBC 450* 12/21/2014   UIBC 436* 12/21/2014   IRONPCTSAT 3* 12/21/2014   Lab Results  Component Value Date   RETICCTPCT 2.4* 12/21/2014   RBC 3.70* 12/21/2014   RETICCTABS 88.8 12/21/2014   No results found for: KPAFRELGTCHN, LAMBDASER, KAPLAMBRATIO No results found for: IGGSERUM, IGA, IGMSERUM No results found for: Odetta Pink, SPEI   Chemistry      Component Value Date/Time   NA 143 06/18/2013 0030   NA 141 12/08/2011 1145   K 3.0* 06/18/2013 0030   K 4.3 12/08/2011 1145   CL 101 06/18/2013 0030   CL 106 12/08/2011 1145   CO2 29 06/18/2013 0030   CO2 27 12/08/2011 1145   BUN 8 06/18/2013 0030   BUN 9 12/08/2011 1145   CREATININE 1.00 06/18/2013 0030   CREATININE 1.1 12/08/2011 1145      Component Value Date/Time   CALCIUM 9.4 06/18/2013 0030   CALCIUM 9.0 12/08/2011 1145   ALKPHOS 108 06/18/2013 0030   ALKPHOS 69 12/08/2011 1145   AST 27 06/18/2013 0030   AST 30 12/08/2011 1145   ALT 24 06/18/2013 0030   ALT 29 12/08/2011 1145   BILITOT 0.1* 06/18/2013 0030   BILITOT 0.40 12/08/2011 1145     Impression and Plan: Kari Hahn is 45 year old female with history of an idiopathic aortic thrombosis and iron deficiency anemia secondary to chronic blood loss with ulcerative colitis. She is currently having a flare and ion a prednisone dose pack. Her stools are still dark and tarry. She is symptomatic with fatigue, palpitations and dizziness.  Her Hgb is down at 9.0 with an MCV of 75  and platelet count elevated at 846. We will see what her iron studies show.  We will give her iron today followed by a second dose in 8 days.  We will plan to see her back in 6 weeks for  labs and follow-up.  She knows to call here with any questions or concerns. We can certainly see her sooner if need be.   Eliezer Bottom, NP 4/26/201710:37 AM

## 2015-10-21 LAB — RETICULOCYTES: RETICULOCYTE COUNT: 1.8 % (ref 0.6–2.6)

## 2015-10-21 LAB — VITAMIN B12

## 2015-10-28 ENCOUNTER — Ambulatory Visit (HOSPITAL_BASED_OUTPATIENT_CLINIC_OR_DEPARTMENT_OTHER): Payer: BLUE CROSS/BLUE SHIELD

## 2015-10-28 VITALS — BP 152/77 | HR 84 | Temp 98.2°F | Resp 16

## 2015-10-28 DIAGNOSIS — D500 Iron deficiency anemia secondary to blood loss (chronic): Secondary | ICD-10-CM | POA: Diagnosis not present

## 2015-10-28 DIAGNOSIS — K5190 Ulcerative colitis, unspecified, without complications: Secondary | ICD-10-CM

## 2015-10-28 DIAGNOSIS — D509 Iron deficiency anemia, unspecified: Secondary | ICD-10-CM

## 2015-10-28 MED ORDER — SODIUM CHLORIDE 0.9 % IV SOLN
Freq: Once | INTRAVENOUS | Status: AC
  Administered 2015-10-28: 09:00:00 via INTRAVENOUS

## 2015-10-28 MED ORDER — FERUMOXYTOL INJECTION 510 MG/17 ML
510.0000 mg | Freq: Once | INTRAVENOUS | Status: AC
  Administered 2015-10-28: 510 mg via INTRAVENOUS
  Filled 2015-10-28: qty 17

## 2015-10-28 NOTE — Patient Instructions (Signed)

## 2015-12-01 ENCOUNTER — Other Ambulatory Visit: Payer: BLUE CROSS/BLUE SHIELD

## 2015-12-01 ENCOUNTER — Ambulatory Visit: Payer: BLUE CROSS/BLUE SHIELD | Admitting: Family

## 2016-01-24 ENCOUNTER — Ambulatory Visit (HOSPITAL_BASED_OUTPATIENT_CLINIC_OR_DEPARTMENT_OTHER): Payer: BLUE CROSS/BLUE SHIELD | Admitting: Family

## 2016-01-24 ENCOUNTER — Other Ambulatory Visit (HOSPITAL_BASED_OUTPATIENT_CLINIC_OR_DEPARTMENT_OTHER): Payer: BLUE CROSS/BLUE SHIELD

## 2016-01-24 VITALS — BP 111/82 | HR 101 | Temp 98.3°F | Resp 18

## 2016-01-24 DIAGNOSIS — I7410 Embolism and thrombosis of unspecified parts of aorta: Secondary | ICD-10-CM | POA: Diagnosis not present

## 2016-01-24 DIAGNOSIS — R42 Dizziness and giddiness: Secondary | ICD-10-CM

## 2016-01-24 DIAGNOSIS — D509 Iron deficiency anemia, unspecified: Secondary | ICD-10-CM

## 2016-01-24 DIAGNOSIS — D510 Vitamin B12 deficiency anemia due to intrinsic factor deficiency: Secondary | ICD-10-CM

## 2016-01-24 DIAGNOSIS — D500 Iron deficiency anemia secondary to blood loss (chronic): Secondary | ICD-10-CM

## 2016-01-24 DIAGNOSIS — R5383 Other fatigue: Secondary | ICD-10-CM

## 2016-01-24 DIAGNOSIS — K5190 Ulcerative colitis, unspecified, without complications: Secondary | ICD-10-CM

## 2016-01-24 LAB — IRON AND TIBC
%SAT: 4 % — AB (ref 21–57)
IRON: 17 ug/dL — AB (ref 41–142)
TIBC: 390 ug/dL (ref 236–444)
UIBC: 373 ug/dL (ref 120–384)

## 2016-01-24 LAB — CBC WITH DIFFERENTIAL (CANCER CENTER ONLY)
BASO#: 0.1 10*3/uL (ref 0.0–0.2)
BASO%: 0.6 % (ref 0.0–2.0)
EOS%: 2.5 % (ref 0.0–7.0)
Eosinophils Absolute: 0.4 10*3/uL (ref 0.0–0.5)
HEMATOCRIT: 37.9 % (ref 34.8–46.6)
HEMOGLOBIN: 12.5 g/dL (ref 11.6–15.9)
LYMPH#: 3.1 10*3/uL (ref 0.9–3.3)
LYMPH%: 19.4 % (ref 14.0–48.0)
MCH: 29.3 pg (ref 26.0–34.0)
MCHC: 33 g/dL (ref 32.0–36.0)
MCV: 89 fL (ref 81–101)
MONO#: 1.1 10*3/uL — ABNORMAL HIGH (ref 0.1–0.9)
MONO%: 7 % (ref 0.0–13.0)
NEUT%: 70.5 % (ref 39.6–80.0)
NEUTROS ABS: 11.1 10*3/uL — AB (ref 1.5–6.5)
Platelets: 714 10*3/uL — ABNORMAL HIGH (ref 145–400)
RBC: 4.26 10*6/uL (ref 3.70–5.32)
RDW: 18.9 % — ABNORMAL HIGH (ref 11.1–15.7)
WBC: 15.8 10*3/uL — ABNORMAL HIGH (ref 3.9–10.0)

## 2016-01-24 LAB — RETICULOCYTES: RETICULOCYTE COUNT: 1.4 % (ref 0.6–2.6)

## 2016-01-24 LAB — FERRITIN: Ferritin: 9 ng/ml (ref 9–269)
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
## 2016-01-25 LAB — VITAMIN B12: Vitamin B12: 842 pg/mL (ref 211–946)

## 2016-01-26 ENCOUNTER — Other Ambulatory Visit: Payer: Self-pay | Admitting: *Deleted

## 2016-01-26 DIAGNOSIS — D509 Iron deficiency anemia, unspecified: Secondary | ICD-10-CM

## 2016-01-27 ENCOUNTER — Ambulatory Visit (HOSPITAL_BASED_OUTPATIENT_CLINIC_OR_DEPARTMENT_OTHER): Payer: BLUE CROSS/BLUE SHIELD

## 2016-01-27 VITALS — BP 144/78 | HR 85 | Temp 98.2°F | Resp 16

## 2016-01-27 DIAGNOSIS — D500 Iron deficiency anemia secondary to blood loss (chronic): Secondary | ICD-10-CM | POA: Diagnosis not present

## 2016-01-27 DIAGNOSIS — D509 Iron deficiency anemia, unspecified: Secondary | ICD-10-CM

## 2016-01-27 DIAGNOSIS — K5190 Ulcerative colitis, unspecified, without complications: Secondary | ICD-10-CM

## 2016-01-27 MED ORDER — SODIUM CHLORIDE 0.9 % IV SOLN
510.0000 mg | Freq: Once | INTRAVENOUS | Status: AC
  Administered 2016-01-27: 510 mg via INTRAVENOUS
  Filled 2016-01-27: qty 17

## 2016-01-27 MED ORDER — SODIUM CHLORIDE 0.9 % IV SOLN
Freq: Once | INTRAVENOUS | Status: AC
  Administered 2016-01-27: 10:00:00 via INTRAVENOUS

## 2016-01-27 NOTE — Patient Instructions (Signed)

## 2016-02-03 ENCOUNTER — Ambulatory Visit: Payer: BLUE CROSS/BLUE SHIELD

## 2016-02-09 ENCOUNTER — Ambulatory Visit (HOSPITAL_BASED_OUTPATIENT_CLINIC_OR_DEPARTMENT_OTHER): Payer: BLUE CROSS/BLUE SHIELD

## 2016-02-09 DIAGNOSIS — D509 Iron deficiency anemia, unspecified: Secondary | ICD-10-CM | POA: Diagnosis not present

## 2016-02-09 MED ORDER — SODIUM CHLORIDE 0.9 % IV SOLN
510.0000 mg | Freq: Once | INTRAVENOUS | Status: AC
  Administered 2016-02-09: 510 mg via INTRAVENOUS
  Filled 2016-02-09: qty 17

## 2016-02-09 MED ORDER — SODIUM CHLORIDE 0.9 % IV SOLN
Freq: Once | INTRAVENOUS | Status: AC
  Administered 2016-02-09: 15:00:00 via INTRAVENOUS

## 2016-02-09 NOTE — Patient Instructions (Signed)

## 2016-04-24 ENCOUNTER — Telehealth: Payer: Self-pay | Admitting: Hematology & Oncology

## 2016-04-24 ENCOUNTER — Ambulatory Visit: Payer: BLUE CROSS/BLUE SHIELD | Admitting: Family

## 2016-04-24 ENCOUNTER — Other Ambulatory Visit: Payer: BLUE CROSS/BLUE SHIELD

## 2016-04-24 NOTE — Telephone Encounter (Signed)
Patient called and stated she ran out of gas.  She cx her apt for today.  Rn was notified and resch for 04/25/16

## 2016-04-25 ENCOUNTER — Encounter: Payer: Self-pay | Admitting: Family

## 2016-04-25 ENCOUNTER — Other Ambulatory Visit (HOSPITAL_BASED_OUTPATIENT_CLINIC_OR_DEPARTMENT_OTHER): Payer: BLUE CROSS/BLUE SHIELD

## 2016-04-25 ENCOUNTER — Ambulatory Visit (HOSPITAL_BASED_OUTPATIENT_CLINIC_OR_DEPARTMENT_OTHER): Payer: BLUE CROSS/BLUE SHIELD | Admitting: Family

## 2016-04-25 VITALS — BP 134/76 | HR 83 | Temp 98.4°F | Resp 18 | Ht 62.0 in | Wt 169.0 lb

## 2016-04-25 DIAGNOSIS — N926 Irregular menstruation, unspecified: Secondary | ICD-10-CM | POA: Diagnosis not present

## 2016-04-25 DIAGNOSIS — I7410 Embolism and thrombosis of unspecified parts of aorta: Secondary | ICD-10-CM | POA: Diagnosis not present

## 2016-04-25 DIAGNOSIS — D500 Iron deficiency anemia secondary to blood loss (chronic): Secondary | ICD-10-CM

## 2016-04-25 DIAGNOSIS — M79604 Pain in right leg: Secondary | ICD-10-CM

## 2016-04-25 DIAGNOSIS — D510 Vitamin B12 deficiency anemia due to intrinsic factor deficiency: Secondary | ICD-10-CM

## 2016-04-25 DIAGNOSIS — K5190 Ulcerative colitis, unspecified, without complications: Secondary | ICD-10-CM

## 2016-04-25 DIAGNOSIS — M79605 Pain in left leg: Secondary | ICD-10-CM

## 2016-04-25 DIAGNOSIS — D509 Iron deficiency anemia, unspecified: Secondary | ICD-10-CM

## 2016-04-25 DIAGNOSIS — M79622 Pain in left upper arm: Secondary | ICD-10-CM

## 2016-04-25 LAB — CBC WITH DIFFERENTIAL (CANCER CENTER ONLY)
BASO#: 0.1 10*3/uL (ref 0.0–0.2)
BASO%: 0.5 % (ref 0.0–2.0)
EOS ABS: 0.3 10*3/uL (ref 0.0–0.5)
EOS%: 2.2 % (ref 0.0–7.0)
HEMATOCRIT: 40.3 % (ref 34.8–46.6)
HGB: 13.5 g/dL (ref 11.6–15.9)
LYMPH#: 2.1 10*3/uL (ref 0.9–3.3)
LYMPH%: 14.1 % (ref 14.0–48.0)
MCH: 31.2 pg (ref 26.0–34.0)
MCHC: 33.5 g/dL (ref 32.0–36.0)
MCV: 93 fL (ref 81–101)
MONO#: 1 10*3/uL — AB (ref 0.1–0.9)
MONO%: 6.8 % (ref 0.0–13.0)
NEUT#: 11.5 10*3/uL — ABNORMAL HIGH (ref 1.5–6.5)
NEUT%: 76.4 % (ref 39.6–80.0)
PLATELETS: 518 10*3/uL — AB (ref 145–400)
RBC: 4.33 10*6/uL (ref 3.70–5.32)
RDW: 17.2 % — AB (ref 11.1–15.7)
WBC: 15.1 10*3/uL — ABNORMAL HIGH (ref 3.9–10.0)

## 2016-04-25 LAB — TECHNOLOGIST REVIEW CHCC SATELLITE

## 2016-04-25 NOTE — Progress Notes (Signed)
Hematology and Oncology Follow Up Visit  Kari Hahn [id number] 1971/06/25 45 y.o. 04/25/2016   Principle Diagnosis:  1. Idiopathic aortic thrombosis 2. Pernicious anemia 3. Iron-deficiency anemia 4. Ulcerative colitis  Current Therapy:   1. Vitamin B12 PO BID daily 2. IV iron as needed - last received in August 2017 x2    Interim History: Kari Hahn is here today for a follow-up. She is feeling fatigued and has had a few episodes of palpitations. She has also had some SOB with exertion. This passes when she takes a few moments to rest. She is still smoking but is down to less than 2 ppd.  Her cycles have been irregular occurring twice a month for the last 2 months. She states that they have been quite heavy. She received 2 doses of Feraheme in August. Her iron saturation at that time was 4% with a ferritin of 9. Her Hgb is now up to 13.5 and platelets have come down to 518.  She has had some tenderness in her legs and "puffyness" in her ankles. She has no swelling in her extremities at this time. She has tenderness in her calves, more so in the right calf when dorsiflexing her feet.   She also had some tenderness in the left axilla on exam. No lymphadenopathy was noted.  No fever, chills, n/v, cough, rash, dizziness, headache, chest pain, abdominal pain or changes in bladder habits. She has occasional diarrhea due to UC. She has not noticed any blood in her stools.  No bruising or petechiae.  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 She has a good appetite and is staying well hydrated. Her weight is stable.   Medications:    Medication List       Accurate as of 04/25/16  3:11 PM. Always use your most recent med list.          ALPRAZolam 0.5 MG tablet Commonly known as:  XANAX Take 1 tablet (0.5 mg total) by mouth at bedtime as needed for sleep.   baclofen 10 MG tablet Commonly known as:  LIORESAL Take 10 mg by mouth as needed. 07-28-14  NOW TAKING 20 MG QID   EXFORGE  5-160 MG tablet Generic drug:  amLODipine-valsartan 1 TABLET BY MOUTH DAILY FOR BLOOD PRESSURE   fluticasone 50 MCG/ACT nasal spray Commonly known as:  FLONASE   folic acid 1 MG tablet Commonly known as:  FOLVITE Take 1 mg by mouth daily.   gabapentin 600 MG tablet Commonly known as:  NEURONTIN Take 600 mg by mouth 3 (three) times daily.   LEXAPRO 20 MG tablet Generic drug:  escitalopram Take 20 mg by mouth daily. 2  20 mg tab = 40 mg daily   omeprazole 40 MG capsule Commonly known as:  PRILOSEC Take 40 mg by mouth daily.   oxyCODONE-acetaminophen 10-325 MG tablet Commonly known as:  PERCOCET Take 1 tablet by mouth every 6 (six) hours as needed.   PENTASA 500 MG CR capsule Generic drug:  mesalamine TK 2 CS PO QID FOR CROHNS   Potassium Chloride ER 20 MEQ Tbcr   potassium chloride SA 20 MEQ tablet Commonly known as:  K-DUR,KLOR-CON Take 20 mEq by mouth daily.   promethazine 25 MG tablet Commonly known as:  PHENERGAN Take 25 mg by mouth every 6 (six) hours as needed.   VITAMIN B 12 PO Take by mouth 2 (two) times daily.   vitamin C 1000 MG tablet Take 1,000 mg by mouth daily.  Vitamin D3 5000 units Tabs Take by mouth every morning.   Zinc 50 MG Tabs TK 1 T PO D   zinc gluconate 50 MG tablet Take 50 mg by mouth daily.       Allergies:  Allergies  Allergen Reactions  . Eggs Or Egg-Derived Products   . Ibuprofen     Past Medical History, Surgical history, Social history, and Family History were reviewed and updated.  Review of Systems: All other 10 point review of systems is negative.   Physical Exam:  height is 5\' 2"  (1.575 m) and weight is 169 lb (76.7 kg). Her oral temperature is 98.4 F (36.9 C). Her blood pressure is 134/76 and her pulse is 83. Her respiration is 18.   Wt Readings from Last 3 Encounters:  04/25/16 169 lb (76.7 kg)  10/20/15 173 lb (78.5 kg)  12/21/14 188 lb (85.3 kg)    Ocular: Sclerae unicteric, pupils equal, round and  reactive to light Ear-nose-throat: Oropharynx clear, dentition fair Lymphatic: No cervical supraclavicular or axillary adenopathy Lungs no rales or rhonchi, good excursion bilaterally Heart regular rate and rhythm, no murmur appreciated Abd soft, nontender, positive bowel sounds, no liver or spleen tip palpated on exam, no fluid wave MSK no focal spinal tenderness, no joint edema Neuro: non-focal, well-oriented, appropriate affect Breasts: Deferred  Lab Results  Component Value Date   WBC 15.1 (H) 04/25/2016   HGB 13.5 04/25/2016   HCT 40.3 04/25/2016   MCV 93 04/25/2016   PLT 518 (H) 04/25/2016   Lab Results  Component Value Date   FERRITIN 9 01/24/2016   IRON 17 (L) 01/24/2016   TIBC 390 01/24/2016   UIBC 373 01/24/2016   IRONPCTSAT 4 (L) 01/24/2016   Lab Results  Component Value Date   RETICCTPCT 2.4 (H) 12/21/2014   RBC 4.33 04/25/2016   RETICCTABS 88.8 12/21/2014   No results found for: KPAFRELGTCHN, LAMBDASER, KAPLAMBRATIO No results found for: IGGSERUM, IGA, IGMSERUM No results found for: Odetta Pink, SPEI   Chemistry      Component Value Date/Time   NA 143 06/18/2013 0030   NA 141 12/08/2011 1145   K 3.0 (L) 06/18/2013 0030   K 4.3 12/08/2011 1145   CL 101 06/18/2013 0030   CL 106 12/08/2011 1145   CO2 29 06/18/2013 0030   CO2 27 12/08/2011 1145   BUN 8 06/18/2013 0030   BUN 9 12/08/2011 1145   CREATININE 1.00 06/18/2013 0030   CREATININE 1.1 12/08/2011 1145      Component Value Date/Time   CALCIUM 9.4 06/18/2013 0030   CALCIUM 9.0 12/08/2011 1145   ALKPHOS 108 06/18/2013 0030   ALKPHOS 69 12/08/2011 1145   AST 27 06/18/2013 0030   AST 30 12/08/2011 1145   ALT 24 06/18/2013 0030   ALT 29 12/08/2011 1145   BILITOT 0.1 (L) 06/18/2013 0030   BILITOT 0.40 12/08/2011 1145     Impression and Plan: Kari Hahn is a 45 yo white female with history of an idiopathic aortic thrombosis and iron deficiency  anemia secondary to chronic blood loss with ulcerative colitis. Her cycles have become irregular occurring twice a month and heavy. She is symptomatic with fatigue, occasional palpitations and SOB with exertion.  She last received iron in August. Her iron was quite low at that point. Her Hgb today is stable.  We will see what her iron studies show and bring her back in later this week for an infusion if  needed.  She is c/o bilateral leg pain and ankle puffiness and has positive Homan's sign when dorsiflexing feet. With her history of idiopathic thrombus we will do a bilateral US of her lower extremities.  She also had tenderness in her left axilla during exam. We will get a mammogram and left breast US to better evaluate.  We will go ahead and plan to see her back in 3 months for labs and follow-up.  She will contact us with any questions or concerns. We can certainly see her sooner if need be.   Eliezer Bottom, NP 10/31/20173:11 PM

## 2016-04-26 LAB — IRON AND TIBC
%SAT: 7 % — ABNORMAL LOW (ref 21–57)
IRON: 28 ug/dL — AB (ref 41–142)
TIBC: 370 ug/dL (ref 236–444)
UIBC: 342 ug/dL (ref 120–384)

## 2016-04-26 LAB — FERRITIN: FERRITIN: 14 ng/mL (ref 9–269)

## 2016-04-26 LAB — VITAMIN B12: VITAMIN B 12: 577 pg/mL (ref 211–946)

## 2016-04-26 LAB — RETICULOCYTES: RETICULOCYTE COUNT: 1.5 % (ref 0.6–2.6)

## 2016-04-27 ENCOUNTER — Other Ambulatory Visit: Payer: Self-pay | Admitting: Family

## 2016-04-27 DIAGNOSIS — M79622 Pain in left upper arm: Secondary | ICD-10-CM

## 2016-04-27 DIAGNOSIS — M79605 Pain in left leg: Secondary | ICD-10-CM

## 2016-04-27 DIAGNOSIS — M79604 Pain in right leg: Secondary | ICD-10-CM

## 2016-04-27 DIAGNOSIS — Z86718 Personal history of other venous thrombosis and embolism: Secondary | ICD-10-CM

## 2016-04-28 ENCOUNTER — Telehealth: Payer: Self-pay | Admitting: *Deleted

## 2016-04-28 NOTE — Telephone Encounter (Addendum)
Patient aware of results. Message sent to scheduler.  ----- Message from Eliezer Bottom, NP sent at 04/26/2016  9:15 AM EDT ----- Regarding: Iron Iron still quite low. She will need two doses of iron scheduled over the course of 8 days please. Thank you!  Kari Hahn  ----- Message ----- From: Interface, Lab In Three Zero One Sent: 04/25/2016   2:34 PM To: Eliezer Bottom, NP

## 2016-05-02 ENCOUNTER — Ambulatory Visit: Payer: BLUE CROSS/BLUE SHIELD

## 2016-05-02 ENCOUNTER — Other Ambulatory Visit: Payer: BLUE CROSS/BLUE SHIELD

## 2016-05-03 ENCOUNTER — Ambulatory Visit
Admission: RE | Admit: 2016-05-03 | Discharge: 2016-05-03 | Disposition: A | Discharge status: Home / Self Care | Payer: BLUE CROSS/BLUE SHIELD | Source: Ambulatory Visit | Attending: Family | Admitting: Family

## 2016-05-03 ENCOUNTER — Other Ambulatory Visit: Payer: Self-pay | Admitting: Family

## 2016-05-03 ENCOUNTER — Telehealth: Payer: Self-pay | Admitting: Family

## 2016-05-03 ENCOUNTER — Other Ambulatory Visit: Payer: BLUE CROSS/BLUE SHIELD

## 2016-05-03 DIAGNOSIS — M79622 Pain in left upper arm: Secondary | ICD-10-CM

## 2016-05-03 DIAGNOSIS — Z86718 Personal history of other venous thrombosis and embolism: Secondary | ICD-10-CM

## 2016-05-03 DIAGNOSIS — M79605 Pain in left leg: Principal | ICD-10-CM

## 2016-05-03 DIAGNOSIS — M79604 Pain in right leg: Secondary | ICD-10-CM

## 2016-05-03 NOTE — Telephone Encounter (Signed)
Called Ms. Starin to give her [place] and mammogram results. No answer and no voicemail available. Will try again later.

## 2016-05-04 ENCOUNTER — Telehealth: Payer: Self-pay | Admitting: Family

## 2016-05-04 ENCOUNTER — Other Ambulatory Visit: Payer: Self-pay | Admitting: Family

## 2016-05-04 DIAGNOSIS — E612 Magnesium deficiency: Secondary | ICD-10-CM

## 2016-05-04 NOTE — Telephone Encounter (Signed)
Spoke with Kari Hahn and gave her mammogram and [place] results from earlier this week. All questions were answered.  She would like to have her magnesium checked when she comes in for her iron infusion. I will add this to her orders.

## 2016-05-11 ENCOUNTER — Ambulatory Visit: Payer: BLUE CROSS/BLUE SHIELD

## 2016-05-11 ENCOUNTER — Other Ambulatory Visit: Payer: BLUE CROSS/BLUE SHIELD

## 2016-05-12 ENCOUNTER — Ambulatory Visit (HOSPITAL_BASED_OUTPATIENT_CLINIC_OR_DEPARTMENT_OTHER): Payer: BLUE CROSS/BLUE SHIELD

## 2016-05-12 ENCOUNTER — Ambulatory Visit: Payer: BLUE CROSS/BLUE SHIELD

## 2016-05-12 ENCOUNTER — Other Ambulatory Visit (HOSPITAL_BASED_OUTPATIENT_CLINIC_OR_DEPARTMENT_OTHER): Payer: BLUE CROSS/BLUE SHIELD

## 2016-05-12 VITALS — BP 138/78 | HR 91 | Temp 98.2°F | Resp 18

## 2016-05-12 DIAGNOSIS — E612 Magnesium deficiency: Secondary | ICD-10-CM

## 2016-05-12 DIAGNOSIS — K5190 Ulcerative colitis, unspecified, without complications: Secondary | ICD-10-CM

## 2016-05-12 DIAGNOSIS — D500 Iron deficiency anemia secondary to blood loss (chronic): Secondary | ICD-10-CM

## 2016-05-12 DIAGNOSIS — D509 Iron deficiency anemia, unspecified: Secondary | ICD-10-CM

## 2016-05-12 DIAGNOSIS — D510 Vitamin B12 deficiency anemia due to intrinsic factor deficiency: Secondary | ICD-10-CM

## 2016-05-12 LAB — MAGNESIUM: MAGNESIUM: 1.8 mg/dL (ref 1.5–2.5)

## 2016-05-12 MED ORDER — SODIUM CHLORIDE 0.9 % IV SOLN
510.0000 mg | Freq: Once | INTRAVENOUS | Status: AC
  Administered 2016-05-12: 510 mg via INTRAVENOUS
  Filled 2016-05-12: qty 17

## 2016-05-12 MED ORDER — SODIUM CHLORIDE 0.9 % IV SOLN
INTRAVENOUS | Status: DC
  Administered 2016-05-12: 11:00:00 via INTRAVENOUS

## 2016-05-12 NOTE — Patient Instructions (Signed)
Ferumoxytol injection What is this medicine? FERUMOXYTOL is an iron complex. Iron is used to make healthy red blood cells, which carry oxygen and nutrients throughout the body. This medicine is used to treat iron deficiency anemia in people with chronic kidney disease. COMMON BRAND NAME(S): Feraheme What should I tell my health care provider before I take this medicine? They need to know if you have any of these conditions: -anemia not caused by low iron levels -high levels of iron in the blood -magnetic resonance imaging (MRI) test scheduled -an unusual or allergic reaction to iron, other medicines, foods, dyes, or preservatives -pregnant or trying to get pregnant -breast-feeding How should I use this medicine? This medicine is for injection into a vein. It is given by a health care professional in a hospital or clinic setting. Talk to your pediatrician regarding the use of this medicine in children. Special care may be needed. What if I miss a dose? It is important not to miss your dose. Call your doctor or health care professional if you are unable to keep an appointment. What may interact with this medicine? This medicine may interact with the following medications: -other iron products What should I watch for while using this medicine? Visit your doctor or healthcare professional regularly. Tell your doctor or healthcare professional if your symptoms do not start to get better or if they get worse. You may need blood work done while you are taking this medicine. You may need to follow a special diet. Talk to your doctor. Foods that contain iron include: whole grains/cereals, dried fruits, beans, or peas, leafy green vegetables, and organ meats (liver, kidney). What side effects may I notice from receiving this medicine? Side effects that you should report to your doctor or health care professional as soon as possible: -allergic reactions like skin rash, itching or hives, swelling of the  face, lips, or tongue -breathing problems -changes in blood pressure -feeling faint or lightheaded, falls -fever or chills -flushing, sweating, or hot feelings -swelling of the ankles or feet Side effects that usually do not require medical attention (report to your doctor or health care professional if they continue or are bothersome): -diarrhea -headache -nausea, vomiting -stomach pain Where should I keep my medicine? This drug is given in a hospital or clinic and will not be stored at home.  2017 Elsevier/Gold Standard (2015-07-15 12:41:49)  

## 2016-05-15 ENCOUNTER — Telehealth: Payer: Self-pay | Admitting: *Deleted

## 2016-05-15 NOTE — Telephone Encounter (Addendum)
Patient is aware of results  ----- Message from Eliezer Bottom, NP sent at 05/12/2016  4:56 PM EST ----- Regarding: Mag level Magnesium level normal. Thank you!  Sarah  ----- Message ----- From: Interface, Lab In Three Zero One Sent: 05/12/2016   1:14 PM To: Eliezer Bottom, NP

## 2016-05-22 ENCOUNTER — Ambulatory Visit (HOSPITAL_BASED_OUTPATIENT_CLINIC_OR_DEPARTMENT_OTHER): Payer: BLUE CROSS/BLUE SHIELD

## 2016-05-22 VITALS — BP 128/63 | HR 82 | Temp 98.1°F | Resp 17

## 2016-05-22 DIAGNOSIS — K5190 Ulcerative colitis, unspecified, without complications: Secondary | ICD-10-CM

## 2016-05-22 DIAGNOSIS — D500 Iron deficiency anemia secondary to blood loss (chronic): Secondary | ICD-10-CM

## 2016-05-22 DIAGNOSIS — D509 Iron deficiency anemia, unspecified: Secondary | ICD-10-CM

## 2016-05-22 MED ORDER — FERUMOXYTOL INJECTION 510 MG/17 ML
510.0000 mg | Freq: Once | INTRAVENOUS | Status: AC
Start: 2016-05-22 — End: 2016-05-22
  Administered 2016-05-22: 510 mg via INTRAVENOUS
  Filled 2016-05-22: qty 17

## 2016-05-22 MED ORDER — SODIUM CHLORIDE 0.9 % IV SOLN
INTRAVENOUS | Status: DC
  Administered 2016-05-22: 14:00:00 via INTRAVENOUS

## 2016-05-22 NOTE — Patient Instructions (Signed)
Ferumoxytol injection What is this medicine? FERUMOXYTOL is an iron complex. Iron is used to make healthy red blood cells, which carry oxygen and nutrients throughout the body. This medicine is used to treat iron deficiency anemia in people with chronic kidney disease. COMMON BRAND NAME(S): Feraheme What should I tell my health care provider before I take this medicine? They need to know if you have any of these conditions: -anemia not caused by low iron levels -high levels of iron in the blood -magnetic resonance imaging (MRI) test scheduled -an unusual or allergic reaction to iron, other medicines, foods, dyes, or preservatives -pregnant or trying to get pregnant -breast-feeding How should I use this medicine? This medicine is for injection into a vein. It is given by a health care professional in a hospital or clinic setting. Talk to your pediatrician regarding the use of this medicine in children. Special care may be needed. What if I miss a dose? It is important not to miss your dose. Call your doctor or health care professional if you are unable to keep an appointment. What may interact with this medicine? This medicine may interact with the following medications: -other iron products What should I watch for while using this medicine? Visit your doctor or healthcare professional regularly. Tell your doctor or healthcare professional if your symptoms do not start to get better or if they get worse. You may need blood work done while you are taking this medicine. You may need to follow a special diet. Talk to your doctor. Foods that contain iron include: whole grains/cereals, dried fruits, beans, or peas, leafy green vegetables, and organ meats (liver, kidney). What side effects may I notice from receiving this medicine? Side effects that you should report to your doctor or health care professional as soon as possible: -allergic reactions like skin rash, itching or hives, swelling of the  face, lips, or tongue -breathing problems -changes in blood pressure -feeling faint or lightheaded, falls -fever or chills -flushing, sweating, or hot feelings -swelling of the ankles or feet Side effects that usually do not require medical attention (report to your doctor or health care professional if they continue or are bothersome): -diarrhea -headache -nausea, vomiting -stomach pain Where should I keep my medicine? This drug is given in a hospital or clinic and will not be stored at home.  2017 Elsevier/Gold Standard (2015-07-15 12:41:49)  

## 2016-07-15 ENCOUNTER — Emergency Department (HOSPITAL_BASED_OUTPATIENT_CLINIC_OR_DEPARTMENT_OTHER)
Admission: EM | Admit: 2016-07-15 | Discharge: 2016-07-15 | Disposition: A | Discharge status: Home / Self Care | Payer: BLUE CROSS/BLUE SHIELD | Attending: Emergency Medicine | Admitting: Emergency Medicine

## 2016-07-15 ENCOUNTER — Encounter (HOSPITAL_BASED_OUTPATIENT_CLINIC_OR_DEPARTMENT_OTHER): Payer: Self-pay | Admitting: *Deleted

## 2016-07-15 DIAGNOSIS — F17210 Nicotine dependence, cigarettes, uncomplicated: Secondary | ICD-10-CM | POA: Insufficient documentation

## 2016-07-15 DIAGNOSIS — Z760 Encounter for issue of repeat prescription: Secondary | ICD-10-CM

## 2016-07-15 DIAGNOSIS — Z79899 Other long term (current) drug therapy: Secondary | ICD-10-CM | POA: Diagnosis not present

## 2016-07-15 DIAGNOSIS — I10 Essential (primary) hypertension: Secondary | ICD-10-CM | POA: Diagnosis not present

## 2016-07-15 MED ORDER — OXYCODONE-ACETAMINOPHEN 5-325 MG PO TABS: 1.0000 | ORAL_TABLET | Freq: Four times a day (QID) | ORAL | 0 refills | Status: DC | PRN

## 2016-07-15 NOTE — ED Triage Notes (Signed)
Pt wanting refill for endocet or oxycodone for chronic pain,  Got 7 day refill last week was unable to see md yesterday to get addition  meds

## 2016-07-15 NOTE — Discharge Instructions (Signed)
Return to the ED with any concerns including difficulty breathing, fainting, chest pain, decreased level of alertness/lethargy, or any other alarming symptoms
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
## 2016-07-15 NOTE — ED Notes (Signed)
Documentation error, discharge condition on wrong pt; this pt is a new arrival

## 2016-07-26 ENCOUNTER — Ambulatory Visit (HOSPITAL_BASED_OUTPATIENT_CLINIC_OR_DEPARTMENT_OTHER): Payer: BLUE CROSS/BLUE SHIELD | Admitting: Family

## 2016-07-26 ENCOUNTER — Other Ambulatory Visit (HOSPITAL_BASED_OUTPATIENT_CLINIC_OR_DEPARTMENT_OTHER): Payer: BLUE CROSS/BLUE SHIELD

## 2016-07-26 ENCOUNTER — Other Ambulatory Visit: Payer: BLUE CROSS/BLUE SHIELD

## 2016-07-26 VITALS — BP 132/66 | HR 98 | Temp 99.4°F | Wt 164.2 lb

## 2016-07-26 DIAGNOSIS — D510 Vitamin B12 deficiency anemia due to intrinsic factor deficiency: Secondary | ICD-10-CM | POA: Diagnosis not present

## 2016-07-26 DIAGNOSIS — D500 Iron deficiency anemia secondary to blood loss (chronic): Secondary | ICD-10-CM

## 2016-07-26 DIAGNOSIS — K5190 Ulcerative colitis, unspecified, without complications: Secondary | ICD-10-CM | POA: Diagnosis not present

## 2016-07-26 DIAGNOSIS — I7410 Embolism and thrombosis of unspecified parts of aorta: Secondary | ICD-10-CM | POA: Diagnosis not present

## 2016-07-26 LAB — CBC WITH DIFFERENTIAL (CANCER CENTER ONLY)
BASO#: 0.1 10*3/uL (ref 0.0–0.2)
BASO%: 0.4 % (ref 0.0–2.0)
EOS ABS: 0.2 10*3/uL (ref 0.0–0.5)
EOS%: 1.3 % (ref 0.0–7.0)
HEMATOCRIT: 46.1 % (ref 34.8–46.6)
HGB: 15.7 g/dL (ref 11.6–15.9)
LYMPH#: 1.8 10*3/uL (ref 0.9–3.3)
LYMPH%: 9.7 % — ABNORMAL LOW (ref 14.0–48.0)
MCH: 32.5 pg (ref 26.0–34.0)
MCHC: 34.1 g/dL (ref 32.0–36.0)
MCV: 95 fL (ref 81–101)
MONO#: 0.9 10*3/uL (ref 0.1–0.9)
MONO%: 4.9 % (ref 0.0–13.0)
NEUT#: 15.1 10*3/uL — ABNORMAL HIGH (ref 1.5–6.5)
NEUT%: 83.7 % — ABNORMAL HIGH (ref 39.6–80.0)
Platelets: 603 10*3/uL — ABNORMAL HIGH (ref 145–400)
RBC: 4.83 10*6/uL (ref 3.70–5.32)
RDW: 18.1 % — ABNORMAL HIGH (ref 11.1–15.7)
WBC: 18.1 10*3/uL — AB (ref 3.9–10.0)

## 2016-07-26 LAB — IRON AND TIBC
%SAT: 17 % — AB (ref 21–57)
IRON: 48 ug/dL (ref 41–142)
TIBC: 275 ug/dL (ref 236–444)
UIBC: 227 ug/dL (ref 120–384)

## 2016-07-26 LAB — FERRITIN: Ferritin: 40 ng/ml (ref 9–269)

## 2016-07-26 LAB — CHCC SATELLITE - SMEAR

## 2016-07-26 NOTE — Progress Notes (Signed)
Hematology and Oncology Follow Up Visit  DHANI CICHOSZ [id number] 05/19/1971 46 y.o. 07/26/2016   Principle Diagnosis:  1. Idiopathic aortic thrombosis 2. Pernicious anemia 3. Iron-deficiency anemia 4. Ulcerative colitis  Current Therapy:   1. Vitamin B12 PO BID daily 2. IV iron as needed - last received in November 2017 x2    Interim History: Ms. Schiedel is here today for a follow-up. She is symptomatic with fatigue and occasional palpitations. She has been having frequent flares with her Chron's but states she has not seen any frank blood in her loose stool. She states that she was not happy with her GI doctor and  has not followed up with them since last year. She states that she is trying to find someone else within her network. Where she has Medicaid, she states she is limited.  She last received IV iron in November of last year. Her Hgb is stable at 15.7, platelet count is elevated at 603. Iron study results for today are pending.   She has been working hard to lose weight with portion control and cutting out sugar and carbonated beverages.  No fever, chills, n/v, cough, rash, dizziness, headache, chest pain, abdominal pain or changes in bowel or bladder habits.  No bruising or petechiae. No lymphadenopathy found on exam.  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 She has a good appetite and is staying well hydrated. Her weight is stable.   Medications:  Allergies as of 07/26/2016      Reactions   Eggs Or Egg-derived Products    Ibuprofen       Medication List       Accurate as of 07/26/16  9:16 AM. Always use your most recent med list.          ALPRAZolam 0.5 MG tablet Commonly known as:  XANAX Take 1 tablet (0.5 mg total) by mouth at bedtime as needed for sleep.   baclofen 10 MG tablet Commonly known as:  LIORESAL Take 10 mg by mouth as needed. 07-28-14  NOW TAKING 20 MG QID   EXFORGE 5-160 MG tablet Generic drug:  amLODipine-valsartan 1 TABLET BY MOUTH  DAILY FOR BLOOD PRESSURE   fluticasone 50 MCG/ACT nasal spray Commonly known as:  FLONASE   folic acid 1 MG tablet Commonly known as:  FOLVITE Take 1 mg by mouth daily.   gabapentin 600 MG tablet Commonly known as:  NEURONTIN Take 600 mg by mouth 3 (three) times daily.   LEXAPRO 20 MG tablet Generic drug:  escitalopram Take 20 mg by mouth daily. 2  20 mg tab = 40 mg daily   omeprazole 40 MG capsule Commonly known as:  PRILOSEC Take 40 mg by mouth daily.   oxyCODONE-acetaminophen 5-325 MG tablet Commonly known as:  PERCOCET/ROXICET Take 1-2 tablets by mouth every 6 (six) hours as needed for severe pain.   PENTASA 500 MG CR capsule Generic drug:  mesalamine TK 2 CS PO QID FOR CROHNS   Potassium Chloride ER 20 MEQ Tbcr   potassium chloride SA 20 MEQ tablet Commonly known as:  K-DUR,KLOR-CON Take 20 mEq by mouth daily.   promethazine 25 MG tablet Commonly known as:  PHENERGAN Take 25 mg by mouth every 6 (six) hours as needed.   VITAMIN B 12 PO Take by mouth 2 (two) times daily.   vitamin C 1000 MG tablet Take 1,000 mg by mouth daily.   Vitamin D3 5000 units Tabs Take by mouth every morning.   Zinc  50 MG Tabs TK 1 T PO D   zinc gluconate 50 MG tablet Take 50 mg by mouth daily.       Allergies:  Allergies  Allergen Reactions  . Eggs Or Egg-Derived Products   . Ibuprofen     Past Medical History, Surgical history, Social history, and Family History were reviewed and updated.  Review of Systems: All other 10 point review of systems is negative.   Physical Exam:  weight is 164 lb 4 oz (74.5 kg). Her oral temperature is 99.4 F (37.4 C). Her blood pressure is 132/66 and her pulse is 98.   Wt Readings from Last 3 Encounters:  07/26/16 164 lb 4 oz (74.5 kg)  07/15/16 164 lb (74.4 kg)  04/25/16 169 lb (76.7 kg)    Ocular: Sclerae unicteric, pupils equal, round and reactive to light Ear-nose-throat: Oropharynx clear, dentition fair Lymphatic: No  cervical supraclavicular or axillary adenopathy Lungs no rales or rhonchi, good excursion bilaterally Heart regular rate and rhythm, no murmur appreciated Abd soft, nontender, positive bowel sounds, no liver or spleen tip palpated on exam, no fluid wave MSK no focal spinal tenderness, no joint edema Neuro: non-focal, well-oriented, appropriate affect Breasts: Deferred  Lab Results  Component Value Date   WBC 18.1 (H) 07/26/2016   HGB 15.7 07/26/2016   HCT 46.1 07/26/2016   MCV 95 07/26/2016   PLT 603 (H) 07/26/2016   Lab Results  Component Value Date   FERRITIN 14 04/25/2016   IRON 28 (L) 04/25/2016   TIBC 370 04/25/2016   UIBC 342 04/25/2016   IRONPCTSAT 7 (L) 04/25/2016   Lab Results  Component Value Date   RETICCTPCT 2.4 (H) 12/21/2014   RBC 4.83 07/26/2016   RETICCTABS 88.8 12/21/2014   No results found for: KPAFRELGTCHN, LAMBDASER, KAPLAMBRATIO No results found for: IGGSERUM, IGA, IGMSERUM No results found for: Odetta Pink, SPEI   Chemistry      Component Value Date/Time   NA 143 06/18/2013 0030   NA 141 12/08/2011 1145   K 3.0 (L) 06/18/2013 0030   K 4.3 12/08/2011 1145   CL 101 06/18/2013 0030   CL 106 12/08/2011 1145   CO2 29 06/18/2013 0030   CO2 27 12/08/2011 1145   BUN 8 06/18/2013 0030   BUN 9 12/08/2011 1145   CREATININE 1.00 06/18/2013 0030   CREATININE 1.1 12/08/2011 1145      Component Value Date/Time   CALCIUM 9.4 06/18/2013 0030   CALCIUM 9.0 12/08/2011 1145   ALKPHOS 108 06/18/2013 0030   ALKPHOS 69 12/08/2011 1145   AST 27 06/18/2013 0030   AST 30 12/08/2011 1145   ALT 24 06/18/2013 0030   ALT 29 12/08/2011 1145   BILITOT 0.1 (L) 06/18/2013 0030   BILITOT 0.40 12/08/2011 1145     Impression and Plan: Ms. Marietta is a 46 yo white female with history of an idiopathic aortic thrombosis and iron deficiency anemia secondary to chronic blood loss with ulcerative colitis.  She has been  having frequent Chron's flares and is symptomatic at this time with fatigue and occasional palpitations. She has not received IV iron in 3 months.  We will see what her iron studies show and bring her in later this week for an infusion if needed.  We will go ahead and plan to see her back in 2 months for labs and follow-up.  She will contact our office with any questions or concerns. We can certainly see her  sooner if need be.   Eliezer Bottom, NP 1/31/20189:16 AM

## 2016-07-27 ENCOUNTER — Telehealth: Payer: Self-pay | Admitting: *Deleted

## 2016-07-27 LAB — VITAMIN B12: VITAMIN B 12: 592 pg/mL (ref 232–1245)

## 2016-07-27 LAB — RETICULOCYTES: Reticulocyte Count: 1.8 % (ref 0.6–2.6)

## 2016-07-27 NOTE — Telephone Encounter (Addendum)
Patient aware of results. Appointment made  ----- Message from Eliezer Bottom, NP sent at 07/26/2016 12:24 PM EST ----- Regarding: iron  Iron saturation is still low. She will need one dose of IV iron this week please. Thank you!  Kari Hahn  ----- Message ----- From: Interface, Lab In Three Zero One Sent: 07/26/2016   8:59 AM To: Eliezer Bottom, NP

## 2016-08-03 ENCOUNTER — Ambulatory Visit (HOSPITAL_BASED_OUTPATIENT_CLINIC_OR_DEPARTMENT_OTHER): Payer: BLUE CROSS/BLUE SHIELD

## 2016-08-03 VITALS — BP 156/74 | HR 85 | Temp 99.1°F

## 2016-08-03 DIAGNOSIS — K5190 Ulcerative colitis, unspecified, without complications: Secondary | ICD-10-CM

## 2016-08-03 DIAGNOSIS — D500 Iron deficiency anemia secondary to blood loss (chronic): Secondary | ICD-10-CM | POA: Diagnosis not present

## 2016-08-03 DIAGNOSIS — D509 Iron deficiency anemia, unspecified: Secondary | ICD-10-CM

## 2016-08-03 MED ORDER — FERUMOXYTOL INJECTION 510 MG/17 ML
510.0000 mg | Freq: Once | INTRAVENOUS | Status: AC
  Administered 2016-08-03: 510 mg via INTRAVENOUS
  Filled 2016-08-03: qty 17

## 2016-08-03 NOTE — Patient Instructions (Signed)
Ferumoxytol injection What is this medicine? FERUMOXYTOL is an iron complex. Iron is used to make healthy red blood cells, which carry oxygen and nutrients throughout the body. This medicine is used to treat iron deficiency anemia in people with chronic kidney disease. COMMON BRAND NAME(S): Feraheme What should I tell my health care provider before I take this medicine? They need to know if you have any of these conditions: -anemia not caused by low iron levels -high levels of iron in the blood -magnetic resonance imaging (MRI) test scheduled -an unusual or allergic reaction to iron, other medicines, foods, dyes, or preservatives -pregnant or trying to get pregnant -breast-feeding How should I use this medicine? This medicine is for injection into a vein. It is given by a health care professional in a hospital or clinic setting. Talk to your pediatrician regarding the use of this medicine in children. Special care may be needed. What if I miss a dose? It is important not to miss your dose. Call your doctor or health care professional if you are unable to keep an appointment. What may interact with this medicine? This medicine may interact with the following medications: -other iron products What should I watch for while using this medicine? Visit your doctor or healthcare professional regularly. Tell your doctor or healthcare professional if your symptoms do not start to get better or if they get worse. You may need blood work done while you are taking this medicine. You may need to follow a special diet. Talk to your doctor. Foods that contain iron include: whole grains/cereals, dried fruits, beans, or peas, leafy green vegetables, and organ meats (liver, kidney). What side effects may I notice from receiving this medicine? Side effects that you should report to your doctor or health care professional as soon as possible: -allergic reactions like skin rash, itching or hives, swelling of the  face, lips, or tongue -breathing problems -changes in blood pressure -feeling faint or lightheaded, falls -fever or chills -flushing, sweating, or hot feelings -swelling of the ankles or feet Side effects that usually do not require medical attention (report to your doctor or health care professional if they continue or are bothersome): -diarrhea -headache -nausea, vomiting -stomach pain Where should I keep my medicine? This drug is given in a hospital or clinic and will not be stored at home.  2017 Elsevier/Gold Standard (2015-07-15 12:41:49)  

## 2016-09-22 ENCOUNTER — Ambulatory Visit (HOSPITAL_BASED_OUTPATIENT_CLINIC_OR_DEPARTMENT_OTHER): Payer: BLUE CROSS/BLUE SHIELD | Admitting: Hematology & Oncology

## 2016-09-22 ENCOUNTER — Other Ambulatory Visit (HOSPITAL_BASED_OUTPATIENT_CLINIC_OR_DEPARTMENT_OTHER): Payer: BLUE CROSS/BLUE SHIELD

## 2016-09-22 VITALS — BP 135/61 | HR 94 | Temp 98.3°F | Resp 18 | Wt 169.0 lb

## 2016-09-22 DIAGNOSIS — I7410 Embolism and thrombosis of unspecified parts of aorta: Secondary | ICD-10-CM

## 2016-09-22 DIAGNOSIS — F320 Major depressive disorder, single episode, mild: Secondary | ICD-10-CM

## 2016-09-22 DIAGNOSIS — K5190 Ulcerative colitis, unspecified, without complications: Secondary | ICD-10-CM

## 2016-09-22 DIAGNOSIS — D500 Iron deficiency anemia secondary to blood loss (chronic): Secondary | ICD-10-CM | POA: Diagnosis not present

## 2016-09-22 DIAGNOSIS — D510 Vitamin B12 deficiency anemia due to intrinsic factor deficiency: Secondary | ICD-10-CM

## 2016-09-22 LAB — RETICULOCYTES: Reticulocyte Count: 2 % (ref 0.6–2.6)

## 2016-09-22 LAB — IRON AND TIBC
%SAT: 15 % — AB (ref 21–57)
IRON: 52 ug/dL (ref 41–142)
TIBC: 346 ug/dL (ref 236–444)
UIBC: 294 ug/dL (ref 120–384)

## 2016-09-22 LAB — CBC WITH DIFFERENTIAL (CANCER CENTER ONLY)
BASO#: 0.1 10*3/uL (ref 0.0–0.2)
BASO%: 0.4 % (ref 0.0–2.0)
EOS ABS: 0.3 10*3/uL (ref 0.0–0.5)
EOS%: 1.7 % (ref 0.0–7.0)
HCT: 42.8 % (ref 34.8–46.6)
HGB: 14.9 g/dL (ref 11.6–15.9)
LYMPH#: 2.7 10*3/uL (ref 0.9–3.3)
LYMPH%: 17 % (ref 14.0–48.0)
MCH: 34.3 pg — AB (ref 26.0–34.0)
MCHC: 34.8 g/dL (ref 32.0–36.0)
MCV: 98 fL (ref 81–101)
MONO#: 1.1 10*3/uL — ABNORMAL HIGH (ref 0.1–0.9)
MONO%: 7.1 % (ref 0.0–13.0)
NEUT%: 73.8 % (ref 39.6–80.0)
NEUTROS ABS: 11.8 10*3/uL — AB (ref 1.5–6.5)
PLATELETS: 438 10*3/uL — AB (ref 145–400)
RBC: 4.35 10*6/uL (ref 3.70–5.32)
RDW: 15.5 % (ref 11.1–15.7)
WBC: 16 10*3/uL — AB (ref 3.9–10.0)

## 2016-09-22 LAB — FERRITIN: FERRITIN: 36 ng/mL (ref 9–269)

## 2016-09-22 NOTE — Progress Notes (Signed)
Hematology and Oncology Follow Up Visit  Kari Hahn [id number] August 06, 1970 46 y.o. 09/22/2016   Principle Diagnosis:  1. Idiopathic aortic thrombosis 2. Pernicious anemia 3. Iron-deficiency anemia 4. Ulcerative colitis  Current Therapy:   1. Vitamin B12 PO BID daily 2. IV iron as needed - last received in February 2018    Interim History: Kari Hahn is here today for a follow-up. She feels a little tired. She has had no obvious bleeding.  She is still smoking. She is now down to 1 pack per day. She was on 3 packs a day of tobacco use. She is using the E-cigarettes. I  She has had no fever. She's had no cough. She's had no change in bowel or bladder habits.   Her last iron studies back in January showed a ferritin of 40 with iron saturation of 17%. She did get a dose of iron at that point.   She's had no leg swelling. She has had some pain in the right foot. She may have plantar fasciitis.   Overall, her performance status is ECOG 1.    Medications:  Allergies as of 09/22/2016      Reactions   Eggs Or Egg-derived Products    Ibuprofen       Medication List       Accurate as of 09/22/16  9:45 AM. Always use your most recent med list.          ALPRAZolam 0.5 MG tablet Commonly known as:  XANAX Take 1 tablet (0.5 mg total) by mouth at bedtime as needed for sleep.   baclofen 10 MG tablet Commonly known as:  LIORESAL Take 10 mg by mouth as needed. 07-28-14  NOW TAKING 20 MG QID   EXFORGE 5-160 MG tablet Generic drug:  amLODipine-valsartan 1 TABLET BY MOUTH DAILY FOR BLOOD PRESSURE   fluticasone 50 MCG/ACT nasal spray Commonly known as:  FLONASE   folic acid 1 MG tablet Commonly known as:  FOLVITE Take 1 mg by mouth daily.   gabapentin 600 MG tablet Commonly known as:  NEURONTIN Take 600 mg by mouth 3 (three) times daily.   LEXAPRO 20 MG tablet Generic drug:  escitalopram Take 20 mg by mouth daily. 2  20 mg tab = 40 mg daily   omeprazole 40 MG  capsule Commonly known as:  PRILOSEC Take 40 mg by mouth daily.   oxyCODONE-acetaminophen 5-325 MG tablet Commonly known as:  PERCOCET/ROXICET Take 1-2 tablets by mouth every 6 (six) hours as needed for severe pain.   potassium chloride SA 20 MEQ tablet Commonly known as:  K-DUR,KLOR-CON Take 20 mEq by mouth daily.   promethazine 25 MG tablet Commonly known as:  PHENERGAN Take 25 mg by mouth every 6 (six) hours as needed.   sulfaSALAzine 500 MG EC tablet Commonly known as:  AZULFIDINE Take 1,000 mg by mouth 4 (four) times daily.   VITAMIN B 12 PO Take by mouth 2 (two) times daily.   vitamin C 1000 MG tablet Take 1,000 mg by mouth daily.   Vitamin D3 5000 units Tabs Take by mouth every morning.   zinc gluconate 50 MG tablet Take 50 mg by mouth daily.       Allergies:  Allergies  Allergen Reactions  . Eggs Or Egg-Derived Products   . Ibuprofen     Past Medical History, Surgical history, Social history, and Family History were reviewed and updated.  Review of Systems: All other 10 point review of systems is negative.  Physical Exam:  weight is 169 lb (76.7 kg). Her oral temperature is 98.3 F (36.8 C). Her blood pressure is 135/61 and her pulse is 94. Her respiration is 18 and oxygen saturation is 97%.   Wt Readings from Last 3 Encounters:  09/22/16 169 lb (76.7 kg)  07/26/16 164 lb 4 oz (74.5 kg)  07/15/16 164 lb (74.4 kg)    Ocular: Sclerae unicteric, pupils equal, round and reactive to light Ear-nose-throat: Oropharynx clear, dentition fair Lymphatic: No cervical supraclavicular or axillary adenopathy Lungs no rales or rhonchi, good excursion bilaterally Heart regular rate and rhythm, no murmur appreciated Abd soft, nontender, positive bowel sounds, no liver or spleen tip palpated on exam, no fluid wave MSK no focal spinal tenderness, no joint edema Neuro: non-focal, well-oriented, appropriate affect Breasts: Deferred  Lab Results  Component Value  Date   WBC 16.0 (H) 09/22/2016   HGB 14.9 09/22/2016   HCT 42.8 09/22/2016   MCV 98 09/22/2016   PLT 438 (H) 09/22/2016   Lab Results  Component Value Date   FERRITIN 40 07/26/2016   IRON 48 07/26/2016   TIBC 275 07/26/2016   UIBC 227 07/26/2016   IRONPCTSAT 17 (L) 07/26/2016   Lab Results  Component Value Date   RETICCTPCT 2.4 (H) 12/21/2014   RBC 4.35 09/22/2016   RETICCTABS 88.8 12/21/2014   No results found for: KPAFRELGTCHN, LAMBDASER, KAPLAMBRATIO No results found for: IGGSERUM, IGA, IGMSERUM No results found for: Odetta Pink, SPEI   Chemistry      Component Value Date/Time   NA 143 06/18/2013 0030   NA 141 12/08/2011 1145   K 3.0 (L) 06/18/2013 0030   K 4.3 12/08/2011 1145   CL 101 06/18/2013 0030   CL 106 12/08/2011 1145   CO2 29 06/18/2013 0030   CO2 27 12/08/2011 1145   BUN 8 06/18/2013 0030   BUN 9 12/08/2011 1145   CREATININE 1.00 06/18/2013 0030   CREATININE 1.1 12/08/2011 1145      Component Value Date/Time   CALCIUM 9.4 06/18/2013 0030   CALCIUM 9.0 12/08/2011 1145   ALKPHOS 108 06/18/2013 0030   ALKPHOS 69 12/08/2011 1145   AST 27 06/18/2013 0030   AST 30 12/08/2011 1145   ALT 24 06/18/2013 0030   ALT 29 12/08/2011 1145   BILITOT 0.1 (L) 06/18/2013 0030   BILITOT 0.40 12/08/2011 1145     Impression and Plan: Kari Hahn is a 46 yo white female with history of an idiopathic aortic thrombosis and iron deficiency anemia secondary to chronic blood loss with ulcerative colitis.   I her CBC, I would think that her iron studies should be okay. Her blood count is holding pretty steady.  We'll plan to get her back in 2 more months. I think this would be reasonable.    Volanda Napoleon, MD 3/30/20189:45 AM

## 2016-09-23 LAB — VITAMIN B12: VITAMIN B 12: 407 pg/mL (ref 232–1245)

## 2016-09-25 ENCOUNTER — Telehealth: Payer: Self-pay

## 2016-09-25 NOTE — Telephone Encounter (Addendum)
-----   Message from Kari Bottom, NP sent at 09/25/2016 11:03 AM EDT ----- Regarding: Iron  Iron saturation is low, she will need one dose of IV iron this week please. Thank you!  Sarah  Above message provided to pt via phone. Transferred to scheduling for appt. dph

## 2016-09-27 ENCOUNTER — Ambulatory Visit: Payer: BLUE CROSS/BLUE SHIELD

## 2016-10-16 ENCOUNTER — Telehealth: Payer: Self-pay | Admitting: *Deleted

## 2016-10-16 NOTE — Telephone Encounter (Signed)
Patient is wanting to get a second dose of iron.   She was seen in the office on 3/30 and labs showed normal iron and decreased saturation. She was ordered one dose of feraheme and was scheduled for 09/27/16. She was a no show to that appointment. It was later rescheduled to 4/24.  Spoke to provider and patient. We will go ahead with iron as scheduled and then schedule follow up labs for patient approx 30 days after infusion to determine if patient needs additional iron. She understands this plan and is agreeable.

## 2016-10-17 ENCOUNTER — Ambulatory Visit (HOSPITAL_BASED_OUTPATIENT_CLINIC_OR_DEPARTMENT_OTHER): Payer: BLUE CROSS/BLUE SHIELD

## 2016-10-17 VITALS — BP 124/72 | HR 80 | Temp 97.8°F

## 2016-10-17 DIAGNOSIS — K5190 Ulcerative colitis, unspecified, without complications: Secondary | ICD-10-CM

## 2016-10-17 DIAGNOSIS — D500 Iron deficiency anemia secondary to blood loss (chronic): Secondary | ICD-10-CM

## 2016-10-17 DIAGNOSIS — D509 Iron deficiency anemia, unspecified: Secondary | ICD-10-CM

## 2016-10-17 MED ORDER — SODIUM CHLORIDE 0.9 % IV SOLN
510.0000 mg | Freq: Once | INTRAVENOUS | Status: AC
  Administered 2016-10-17: 510 mg via INTRAVENOUS
  Filled 2016-10-17: qty 17

## 2016-10-17 NOTE — Patient Instructions (Signed)

## 2016-11-16 ENCOUNTER — Other Ambulatory Visit: Payer: BLUE CROSS/BLUE SHIELD

## 2016-11-17 ENCOUNTER — Other Ambulatory Visit (HOSPITAL_BASED_OUTPATIENT_CLINIC_OR_DEPARTMENT_OTHER): Payer: BLUE CROSS/BLUE SHIELD

## 2016-11-17 DIAGNOSIS — F320 Major depressive disorder, single episode, mild: Secondary | ICD-10-CM

## 2016-11-17 DIAGNOSIS — Z79899 Other long term (current) drug therapy: Secondary | ICD-10-CM | POA: Diagnosis not present

## 2016-11-17 DIAGNOSIS — D500 Iron deficiency anemia secondary to blood loss (chronic): Secondary | ICD-10-CM

## 2016-11-17 DIAGNOSIS — K5190 Ulcerative colitis, unspecified, without complications: Secondary | ICD-10-CM

## 2016-11-17 LAB — CBC WITH DIFFERENTIAL (CANCER CENTER ONLY)
BASO#: 0.1 10*3/uL (ref 0.0–0.2)
BASO%: 0.6 % (ref 0.0–2.0)
EOS%: 2.1 % (ref 0.0–7.0)
Eosinophils Absolute: 0.3 10*3/uL (ref 0.0–0.5)
HCT: 40.8 % (ref 34.8–46.6)
HEMOGLOBIN: 13.6 g/dL (ref 11.6–15.9)
LYMPH#: 2.1 10*3/uL (ref 0.9–3.3)
LYMPH%: 14 % (ref 14.0–48.0)
MCH: 33.9 pg (ref 26.0–34.0)
MCHC: 33.3 g/dL (ref 32.0–36.0)
MCV: 102 fL — ABNORMAL HIGH (ref 81–101)
MONO#: 1 10*3/uL — ABNORMAL HIGH (ref 0.1–0.9)
MONO%: 6.6 % (ref 0.0–13.0)
NEUT%: 76.7 % (ref 39.6–80.0)
NEUTROS ABS: 11.6 10*3/uL — AB (ref 1.5–6.5)
Platelets: 490 10*3/uL — ABNORMAL HIGH (ref 145–400)
RBC: 4.01 10*6/uL (ref 3.70–5.32)
RDW: 13.4 % (ref 11.1–15.7)
WBC: 15.1 10*3/uL — ABNORMAL HIGH (ref 3.9–10.0)

## 2016-11-17 LAB — IRON AND TIBC
%SAT: 10 % — AB (ref 21–57)
Iron: 33 ug/dL — ABNORMAL LOW (ref 41–142)
TIBC: 326 ug/dL (ref 236–444)
UIBC: 294 ug/dL (ref 120–384)

## 2016-11-17 LAB — TSH: TSH: 1.219 m[IU]/L (ref 0.308–3.960)

## 2016-11-17 LAB — TECHNOLOGIST REVIEW CHCC SATELLITE

## 2016-11-17 LAB — FERRITIN: FERRITIN: 34 ng/mL (ref 9–269)

## 2016-11-17 LAB — CHCC SATELLITE - SMEAR

## 2016-11-22 ENCOUNTER — Ambulatory Visit: Payer: BLUE CROSS/BLUE SHIELD | Admitting: Hematology & Oncology

## 2016-11-22 ENCOUNTER — Other Ambulatory Visit: Payer: BLUE CROSS/BLUE SHIELD

## 2016-11-27 ENCOUNTER — Ambulatory Visit: Payer: BLUE CROSS/BLUE SHIELD

## 2016-11-28 ENCOUNTER — Ambulatory Visit (HOSPITAL_BASED_OUTPATIENT_CLINIC_OR_DEPARTMENT_OTHER): Payer: BLUE CROSS/BLUE SHIELD

## 2016-11-28 VITALS — BP 117/60 | HR 76 | Temp 98.3°F | Resp 17

## 2016-11-28 DIAGNOSIS — K5190 Ulcerative colitis, unspecified, without complications: Secondary | ICD-10-CM | POA: Diagnosis not present

## 2016-11-28 DIAGNOSIS — D500 Iron deficiency anemia secondary to blood loss (chronic): Secondary | ICD-10-CM

## 2016-11-28 DIAGNOSIS — D509 Iron deficiency anemia, unspecified: Secondary | ICD-10-CM

## 2016-11-28 MED ORDER — SODIUM CHLORIDE 0.9 % IV SOLN
510.0000 mg | Freq: Once | INTRAVENOUS | Status: AC
  Administered 2016-11-28: 510 mg via INTRAVENOUS
  Filled 2016-11-28: qty 17

## 2016-11-28 NOTE — Patient Instructions (Signed)

## 2017-02-07 ENCOUNTER — Other Ambulatory Visit: Payer: Self-pay | Admitting: *Deleted

## 2017-02-07 DIAGNOSIS — D500 Iron deficiency anemia secondary to blood loss (chronic): Secondary | ICD-10-CM

## 2017-02-08 ENCOUNTER — Telehealth: Payer: Self-pay | Admitting: *Deleted

## 2017-02-08 ENCOUNTER — Other Ambulatory Visit (HOSPITAL_BASED_OUTPATIENT_CLINIC_OR_DEPARTMENT_OTHER): Payer: BLUE CROSS/BLUE SHIELD

## 2017-02-08 DIAGNOSIS — K5190 Ulcerative colitis, unspecified, without complications: Secondary | ICD-10-CM

## 2017-02-08 DIAGNOSIS — D500 Iron deficiency anemia secondary to blood loss (chronic): Secondary | ICD-10-CM

## 2017-02-08 LAB — COMPREHENSIVE METABOLIC PANEL
ALBUMIN: 3.3 g/dL — AB (ref 3.5–5.0)
ALK PHOS: 107 U/L (ref 40–150)
ALT: 10 U/L (ref 0–55)
AST: 15 U/L (ref 5–34)
Anion Gap: 10 mEq/L (ref 3–11)
BUN: 7.6 mg/dL (ref 7.0–26.0)
CO2: 25 mEq/L (ref 22–29)
CREATININE: 1.2 mg/dL — AB (ref 0.6–1.1)
Calcium: 9.5 mg/dL (ref 8.4–10.4)
Chloride: 103 mEq/L (ref 98–109)
EGFR: 53 mL/min/{1.73_m2} — ABNORMAL LOW (ref >90)
GLUCOSE: 115 mg/dL (ref 70–140)
SODIUM: 138 meq/L (ref 136–145)
TOTAL PROTEIN: 6.9 g/dL (ref 6.4–8.3)
Total Bilirubin: 0.26 mg/dL (ref 0.20–1.20)

## 2017-02-08 LAB — CBC WITH DIFFERENTIAL (CANCER CENTER ONLY)
BASO#: 0.1 10*3/uL (ref 0.0–0.2)
BASO%: 0.4 % (ref 0.0–2.0)
EOS%: 2.8 % (ref 0.0–7.0)
Eosinophils Absolute: 0.4 10*3/uL (ref 0.0–0.5)
HCT: 39.5 % (ref 34.8–46.6)
HGB: 13.3 g/dL (ref 11.6–15.9)
LYMPH#: 2.5 10*3/uL (ref 0.9–3.3)
LYMPH%: 16.5 % (ref 14.0–48.0)
MCH: 33.4 pg (ref 26.0–34.0)
MCHC: 33.7 g/dL (ref 32.0–36.0)
MCV: 99 fL (ref 81–101)
MONO#: 1.1 10*3/uL — AB (ref 0.1–0.9)
MONO%: 7 % (ref 0.0–13.0)
NEUT#: 11.2 10*3/uL — ABNORMAL HIGH (ref 1.5–6.5)
NEUT%: 73.3 % (ref 39.6–80.0)
PLATELETS: 684 10*3/uL — AB (ref 145–400)
RBC: 3.98 10*6/uL (ref 3.70–5.32)
RDW: 14.6 % (ref 11.1–15.7)
WBC: 15.2 10*3/uL — ABNORMAL HIGH (ref 3.9–10.0)

## 2017-02-08 LAB — IRON AND TIBC
%SAT: 10 % — ABNORMAL LOW (ref 21–57)
IRON: 37 ug/dL — AB (ref 41–142)
TIBC: 369 ug/dL (ref 236–444)
UIBC: 332 ug/dL (ref 120–384)

## 2017-02-08 LAB — FERRITIN: Ferritin: 16 ng/ml (ref 9–269)

## 2017-02-08 NOTE — Telephone Encounter (Signed)
Critical Value Potassium 3.0 [name] NP notified. No orders at this time

## 2017-02-15 ENCOUNTER — Ambulatory Visit: Payer: BLUE CROSS/BLUE SHIELD

## 2017-02-16 ENCOUNTER — Ambulatory Visit: Payer: BLUE CROSS/BLUE SHIELD

## 2017-02-16 ENCOUNTER — Ambulatory Visit (HOSPITAL_BASED_OUTPATIENT_CLINIC_OR_DEPARTMENT_OTHER): Payer: BLUE CROSS/BLUE SHIELD

## 2017-02-16 VITALS — BP 124/59 | HR 96 | Temp 98.9°F

## 2017-02-16 DIAGNOSIS — D500 Iron deficiency anemia secondary to blood loss (chronic): Secondary | ICD-10-CM

## 2017-02-16 DIAGNOSIS — K5190 Ulcerative colitis, unspecified, without complications: Secondary | ICD-10-CM | POA: Diagnosis not present

## 2017-02-16 DIAGNOSIS — D509 Iron deficiency anemia, unspecified: Secondary | ICD-10-CM

## 2017-02-16 MED ORDER — SODIUM CHLORIDE 0.9 % IJ SOLN
3.0000 mL | Freq: Once | INTRAMUSCULAR | Status: DC | PRN
Start: 2017-02-16 — End: 2017-02-16
  Filled 2017-02-16: qty 10

## 2017-02-16 MED ORDER — SODIUM CHLORIDE 0.9 % IV SOLN
510.0000 mg | Freq: Once | INTRAVENOUS | Status: AC
  Administered 2017-02-16: 510 mg via INTRAVENOUS
  Filled 2017-02-16: qty 17

## 2017-02-16 MED ORDER — SODIUM CHLORIDE 0.9 % IJ SOLN
10.0000 mL | INTRAMUSCULAR | Status: DC | PRN
  Filled 2017-02-16: qty 10

## 2017-02-16 MED ORDER — HEPARIN SOD (PORK) LOCK FLUSH 100 UNIT/ML IV SOLN
250.0000 [IU] | Freq: Once | INTRAVENOUS | Status: DC | PRN
  Filled 2017-02-16: qty 5

## 2017-02-16 MED ORDER — ALTEPLASE 2 MG IJ SOLR
2.0000 mg | Freq: Once | INTRAMUSCULAR | Status: DC | PRN
  Filled 2017-02-16: qty 2

## 2017-02-16 MED ORDER — HEPARIN SOD (PORK) LOCK FLUSH 100 UNIT/ML IV SOLN
500.0000 [IU] | Freq: Once | INTRAVENOUS | Status: DC | PRN
  Filled 2017-02-16: qty 5

## 2017-02-16 NOTE — Patient Instructions (Signed)

## 2017-02-19 ENCOUNTER — Ambulatory Visit: Payer: BLUE CROSS/BLUE SHIELD | Admitting: Family

## 2017-02-19 ENCOUNTER — Ambulatory Visit: Payer: BLUE CROSS/BLUE SHIELD

## 2017-02-22 ENCOUNTER — Ambulatory Visit (HOSPITAL_BASED_OUTPATIENT_CLINIC_OR_DEPARTMENT_OTHER): Payer: BLUE CROSS/BLUE SHIELD

## 2017-02-22 VITALS — BP 142/67 | HR 93 | Temp 98.2°F | Resp 18

## 2017-02-22 DIAGNOSIS — K5190 Ulcerative colitis, unspecified, without complications: Secondary | ICD-10-CM

## 2017-02-22 DIAGNOSIS — D500 Iron deficiency anemia secondary to blood loss (chronic): Secondary | ICD-10-CM | POA: Diagnosis not present

## 2017-02-22 DIAGNOSIS — D509 Iron deficiency anemia, unspecified: Secondary | ICD-10-CM

## 2017-02-22 MED ORDER — SODIUM CHLORIDE 0.9 % IV SOLN
Freq: Once | INTRAVENOUS | Status: AC
  Administered 2017-02-22: 10:00:00 via INTRAVENOUS

## 2017-02-22 MED ORDER — SODIUM CHLORIDE 0.9 % IV SOLN
510.0000 mg | Freq: Once | INTRAVENOUS | Status: AC
  Administered 2017-02-22: 510 mg via INTRAVENOUS
  Filled 2017-02-22: qty 17

## 2017-02-22 NOTE — Patient Instructions (Signed)

## 2017-02-22 NOTE — Progress Notes (Signed)
Pt refuses to stay for 30 minutes post observation post feraheme.  Pt has no complaints at time of discharge.

## 2017-03-09 ENCOUNTER — Other Ambulatory Visit: Payer: Self-pay | Admitting: *Deleted

## 2017-03-09 DIAGNOSIS — D500 Iron deficiency anemia secondary to blood loss (chronic): Secondary | ICD-10-CM

## 2017-03-12 ENCOUNTER — Other Ambulatory Visit (HOSPITAL_BASED_OUTPATIENT_CLINIC_OR_DEPARTMENT_OTHER): Payer: BLUE CROSS/BLUE SHIELD

## 2017-03-12 ENCOUNTER — Ambulatory Visit (HOSPITAL_BASED_OUTPATIENT_CLINIC_OR_DEPARTMENT_OTHER): Payer: BLUE CROSS/BLUE SHIELD | Admitting: Family

## 2017-03-12 VITALS — BP 131/67 | HR 86 | Temp 98.6°F | Resp 19 | Wt 167.0 lb

## 2017-03-12 DIAGNOSIS — K5190 Ulcerative colitis, unspecified, without complications: Secondary | ICD-10-CM

## 2017-03-12 DIAGNOSIS — D510 Vitamin B12 deficiency anemia due to intrinsic factor deficiency: Secondary | ICD-10-CM

## 2017-03-12 DIAGNOSIS — D500 Iron deficiency anemia secondary to blood loss (chronic): Secondary | ICD-10-CM | POA: Diagnosis not present

## 2017-03-12 DIAGNOSIS — I7410 Embolism and thrombosis of unspecified parts of aorta: Secondary | ICD-10-CM | POA: Diagnosis not present

## 2017-03-12 LAB — CBC WITH DIFFERENTIAL (CANCER CENTER ONLY)
BASO#: 0.1 10*3/uL (ref 0.0–0.2)
BASO%: 0.5 % (ref 0.0–2.0)
EOS ABS: 0.4 10*3/uL (ref 0.0–0.5)
EOS%: 2.3 % (ref 0.0–7.0)
HCT: 40.7 % (ref 34.8–46.6)
HEMOGLOBIN: 13.7 g/dL (ref 11.6–15.9)
LYMPH#: 1.8 10*3/uL (ref 0.9–3.3)
LYMPH%: 11.5 % — AB (ref 14.0–48.0)
MCH: 34 pg (ref 26.0–34.0)
MCHC: 33.7 g/dL (ref 32.0–36.0)
MCV: 101 fL (ref 81–101)
MONO#: 0.9 10*3/uL (ref 0.1–0.9)
MONO%: 5.7 % (ref 0.0–13.0)
NEUT%: 80 % (ref 39.6–80.0)
NEUTROS ABS: 12.8 10*3/uL — AB (ref 1.5–6.5)
Platelets: 523 10*3/uL — ABNORMAL HIGH (ref 145–400)
RBC: 4.03 10*6/uL (ref 3.70–5.32)
RDW: 16.9 % — ABNORMAL HIGH (ref 11.1–15.7)
WBC: 15.9 10*3/uL — ABNORMAL HIGH (ref 3.9–10.0)

## 2017-03-12 LAB — COMPREHENSIVE METABOLIC PANEL
ALBUMIN: 3 g/dL — AB (ref 3.5–5.0)
ALK PHOS: 125 U/L (ref 40–150)
ALT: 9 U/L (ref 0–55)
AST: 13 U/L (ref 5–34)
Anion Gap: 11 mEq/L (ref 3–11)
BUN: 9.4 mg/dL (ref 7.0–26.0)
CO2: 27 mEq/L (ref 22–29)
Calcium: 9.5 mg/dL (ref 8.4–10.4)
Chloride: 103 mEq/L (ref 98–109)
Creatinine: 1.2 mg/dL — ABNORMAL HIGH (ref 0.6–1.1)
EGFR: 54 mL/min/{1.73_m2} — AB (ref >90)
GLUCOSE: 155 mg/dL — AB (ref 70–140)
POTASSIUM: 2.8 meq/L — AB (ref 3.5–5.1)
SODIUM: 141 meq/L (ref 136–145)
Total Bilirubin: <0.22 mg/dL (ref 0.20–1.20)
Total Protein: 6.6 g/dL (ref 6.4–8.3)

## 2017-03-12 LAB — FERRITIN: FERRITIN: 154 ng/mL (ref 9–269)

## 2017-03-12 LAB — IRON AND TIBC
%SAT: 19 % — ABNORMAL LOW (ref 21–57)
IRON: 49 ug/dL (ref 41–142)
TIBC: 256 ug/dL (ref 236–444)
UIBC: 207 ug/dL (ref 120–384)

## 2017-03-12 NOTE — Progress Notes (Signed)
Hematology and Oncology Follow Up Visit  Kari Hahn [id number] 1971-06-10 46 y.o. 03/12/2017   Principle Diagnosis:  Idiopathic aortic thrombosis Pernicious anemia Iron deficiency anemia Ulcerative colitis  Current Therapy:   Vitamin B12 PO BID daily IV iron as needed - last received in August 2018 x 2   Interim History:  Kari Hahn is here today for follow-up. She is doing well and has no complaints at this time.  Her cycles has been irregular and sometimes occurring twice a month. She has had no other episodes of bleeding, no bruising or petechiae.  No recent UC flares. She denies and blood, maroon or dark, tarry stools.  She has had some occasional fatigue.  She is still smoking.  No fever, chills, n/v, cough, rash, dizziness, SOB, chest pain, palpitations, abdominal pain or changes in bowel or bladder habits.  No swelling, tenderness, numbness or tingling in her extremities at this time. She denies pain.  She has maintained a good appetite and is staying well hydrated. Her weight is stable.   ECOG Performance Status: 1 - Symptomatic but completely ambulatory  Medications:  Allergies as of 03/12/2017      Reactions   Eggs Or Egg-derived Products    Ibuprofen       Medication List       Accurate as of 03/12/17 10:14 AM. Always use your most recent med list.          cyclobenzaprine 10 MG tablet Commonly known as:  FLEXERIL   EXFORGE 5-160 MG tablet Generic drug:  amLODipine-valsartan 1 TABLET BY MOUTH DAILY FOR BLOOD PRESSURE   fluticasone 50 MCG/ACT nasal spray Commonly known as:  FLONASE   folic acid 1 MG tablet Commonly known as:  FOLVITE Take 1 mg by mouth daily.   gabapentin 800 MG tablet Commonly known as:  NEURONTIN   LEXAPRO 20 MG tablet Generic drug:  escitalopram Take 20 mg by mouth daily. 2  20 mg tab = 40 mg daily   omeprazole 40 MG capsule Commonly known as:  PRILOSEC Take 40 mg by mouth daily.   oxyCODONE-acetaminophen 5-325 MG  tablet Commonly known as:  PERCOCET/ROXICET Take 1-2 tablets by mouth every 6 (six) hours as needed for severe pain.   potassium chloride SA 20 MEQ tablet Commonly known as:  K-DUR,KLOR-CON Take 20 mEq by mouth daily.   promethazine 25 MG tablet Commonly known as:  PHENERGAN Take 25 mg by mouth every 6 (six) hours as needed.   sulfaSALAzine 500 MG EC tablet Commonly known as:  AZULFIDINE Take 1,000 mg by mouth 4 (four) times daily.   valACYclovir 500 MG tablet Commonly known as:  VALTREX as needed.   VITAMIN B 12 PO Take by mouth 2 (two) times daily.   vitamin C 1000 MG tablet Take 1,000 mg by mouth daily.   Vitamin D3 5000 units Tabs Take by mouth every morning.   zinc gluconate 50 MG tablet Take 50 mg by mouth daily.       Allergies:  Allergies  Allergen Reactions  . Eggs Or Egg-Derived Products   . Ibuprofen     Past Medical History, Surgical history, Social history, and Family History were reviewed and updated.  Review of Systems: All other 10 point review of systems is negative.   Physical Exam:  weight is 167 lb (75.8 kg). Her oral temperature is 98.6 F (37 C). Her blood pressure is 131/67 and her pulse is 86. Her respiration is 19 and oxygen saturation is  96%.   Wt Readings from Last 3 Encounters:  03/12/17 167 lb (75.8 kg)  09/22/16 169 lb (76.7 kg)  07/26/16 164 lb 4 oz (74.5 kg)    Ocular: Sclerae unicteric, pupils equal, round and reactive to light Ear-nose-throat: Oropharynx clear, dentition fair Lymphatic: No cervical, supraclavicular or axillary adenopathy Lungs no rales or rhonchi, good excursion bilaterally Heart regular rate and rhythm, no murmur appreciated Abd soft, nontender, positive bowel sounds, no liver or spleen tip palpated on exam, no fluid wave MSK no focal spinal tenderness, no joint edema Neuro: non-focal, well-oriented, appropriate affect Breasts: Deferred   Lab Results  Component Value Date   WBC 15.9 (H) 03/12/2017    HGB 13.7 03/12/2017   HCT 40.7 03/12/2017   MCV 101 03/12/2017   PLT 523 (H) 03/12/2017   Lab Results  Component Value Date   FERRITIN 16 02/08/2017   IRON 37 (L) 02/08/2017   TIBC 369 02/08/2017   UIBC 332 02/08/2017   IRONPCTSAT 10 (L) 02/08/2017   Lab Results  Component Value Date   RETICCTPCT 2.4 (H) 12/21/2014   RBC 4.03 03/12/2017   RETICCTABS 88.8 12/21/2014   No results found for: KPAFRELGTCHN, LAMBDASER, KAPLAMBRATIO No results found for: IGGSERUM, IGA, IGMSERUM No results found for: Ronnald Ramp, A1GS, A2GS, Tillman Sers, SPEI   Chemistry      Component Value Date/Time   NA 138 02/08/2017 0852   K 3.0 Repeated and Verified (LL) 02/08/2017 0852   CL 101 06/18/2013 0030   CL 106 12/08/2011 1145   CO2 25 02/08/2017 0852   BUN 7.6 02/08/2017 0852   CREATININE 1.2 (H) 02/08/2017 0852      Component Value Date/Time   CALCIUM 9.5 02/08/2017 0852   ALKPHOS 107 02/08/2017 0852   AST 15 02/08/2017 0852   ALT 10 02/08/2017 0852   BILITOT 0.26 02/08/2017 0852      Impression and Plan: Kari Hahn is a very pleasant 46 yo caucasian female with history of idiopathic aortic thrombosis and iron deficiency anemia secondary to chronic GI blood loss with ulcerative colitis. She still has occasional fatigue.  She received 2 doses of IV iron in August and her Hgb is stable at this time.  We will see what her iron studies show and bring her back in later this week for infusion if needed.  We will go ahead and plan to see her back again in another 6 weeks for repeat lab work and follow-up.  She will contact our office with any questions or concerns. We can certainly see her sooner if need be.   Eliezer Bottom, NP 9/17/201810:14 AM

## 2017-03-14 ENCOUNTER — Ambulatory Visit: Payer: BLUE CROSS/BLUE SHIELD

## 2017-03-15 ENCOUNTER — Ambulatory Visit (HOSPITAL_BASED_OUTPATIENT_CLINIC_OR_DEPARTMENT_OTHER): Payer: BLUE CROSS/BLUE SHIELD

## 2017-03-15 VITALS — BP 141/55 | HR 97 | Temp 98.2°F | Resp 20

## 2017-03-15 DIAGNOSIS — K5190 Ulcerative colitis, unspecified, without complications: Secondary | ICD-10-CM

## 2017-03-15 DIAGNOSIS — D500 Iron deficiency anemia secondary to blood loss (chronic): Secondary | ICD-10-CM | POA: Diagnosis not present

## 2017-03-15 DIAGNOSIS — D509 Iron deficiency anemia, unspecified: Secondary | ICD-10-CM

## 2017-03-15 MED ORDER — SODIUM CHLORIDE 0.9 % IV SOLN
510.0000 mg | Freq: Once | INTRAVENOUS | Status: AC
  Administered 2017-03-15: 510 mg via INTRAVENOUS
  Filled 2017-03-15: qty 17

## 2017-03-15 MED ORDER — SODIUM CHLORIDE 0.9 % IV SOLN
Freq: Once | INTRAVENOUS | Status: AC
  Administered 2017-03-15: 09:00:00 via INTRAVENOUS

## 2017-03-15 NOTE — Patient Instructions (Addendum)
Ferumoxytol injection What is this medicine? FERUMOXYTOL is an iron complex. Iron is used to make healthy red blood cells, which carry oxygen and nutrients throughout the body. This medicine is used to treat iron deficiency anemia in people with chronic kidney disease. This medicine may be used for other purposes; ask your health care provider or pharmacist if you have questions. COMMON BRAND NAME(S): Feraheme What should I tell my health care provider before I take this medicine? They need to know if you have any of these conditions: -anemia not caused by low iron levels -high levels of iron in the blood -magnetic resonance imaging (MRI) test scheduled -an unusual or allergic reaction to iron, other medicines, foods, dyes, or preservatives -pregnant or trying to get pregnant -breast-feeding How should I use this medicine? This medicine is for injection into a vein. It is given by a health care professional in a hospital or clinic setting. Talk to your pediatrician regarding the use of this medicine in children. Special care may be needed. Overdosage: If you think you have taken too much of this medicine contact a poison control center or emergency room at once. NOTE: This medicine is only for you. Do not share this medicine with others. What if I miss a dose? It is important not to miss your dose. Call your doctor or health care professional if you are unable to keep an appointment. What may interact with this medicine? This medicine may interact with the following medications: -other iron products This list may not describe all possible interactions. Give your health care provider a list of all the medicines, herbs, non-prescription drugs, or dietary supplements you use. Also tell them if you smoke, drink alcohol, or use illegal drugs. Some items may interact with your medicine. What should I watch for while using this medicine? Visit your doctor or healthcare professional regularly. Tell  your doctor or healthcare professional if your symptoms do not start to get better or if they get worse. You may need blood work done while you are taking this medicine. You may need to follow a special diet. Talk to your doctor. Foods that contain iron include: whole grains/cereals, dried fruits, beans, or peas, leafy green vegetables, and organ meats (liver, kidney). What side effects may I notice from receiving this medicine? Side effects that you should report to your doctor or health care professional as soon as possible: -allergic reactions like skin rash, itching or hives, swelling of the face, lips, or tongue -breathing problems -changes in blood pressure -feeling faint or lightheaded, falls -fever or chills -flushing, sweating, or hot feelings -swelling of the ankles or feet Side effects that usually do not require medical attention (report to your doctor or health care professional if they continue or are bothersome): -diarrhea -headache -nausea, vomiting -stomach pain This list may not describe all possible side effects. Call your doctor for medical advice about side effects. You may report side effects to FDA at 1-800-FDA-1088. Where should I keep my medicine? This drug is given in a hospital or clinic and will not be stored at home. NOTE: This sheet is a summary. It may not cover all possible information. If you have questions about this medicine, talk to your doctor, pharmacist, or health care provider.  2018 Elsevier/Gold Standard (2015-07-15 12:41:49) 2,3-Diphosphoglycerate Test Why am I having this test? The 2,3-diphosphoglycerate (2,3-DPG) test is used to look for the cause of certain deficiencies in red blood cells (anemias). These anemias are caused by early destruction of red  blood cells (hemolysis). This test may also help to diagnose why you have an increase of red blood cells (erythrocytosis or polycythemia). What kind of sample is taken? A blood sample is required  for this test. It is usually collected by inserting a needle into a vein. How do I prepare for this test? Do not exercise before having the test. Exercise can cause a temporary increase in your 2,3-DPG levels. What are the normal (or reference) ranges? Reference ranges are considered healthy ranges established after testing a large group of healthy people. Reference ranges may vary among different people, labs, and hospitals. It is your responsibility to obtain your test results. Ask the lab or department performing the test when and how you will get your results. Reference ranges are the following:  12.3 plus or minus 1.87 micromoles/g of hemoglobin or 0.79 plus or minus 0.12 mol/mol hemoglobin (SI units).  4.2 plus or minus 0.64 micromoles/mL of erythrocytes or 4.2 plus or minus 0.64 mmol/L erythrocytes (SI units).  What do the results mean? Increased levels of 2,3-DPG may indicate:  Anemia.  Heart and lung diseases, such as: ? Obstructive lung disease. ? Cystic fibrosis. ? Congenital cyanotic heart disease.  An overactive thyroid (hyperthyroidism).  Long-lasting (chronic) kidney failure.  A deficiency of pyruvate kinase enzyme.  Increased levels of 2,3-DPG can also be caused by high altitudes or vigorous exercise. Decreased levels of 2,3-DPH may indicate:  Polycythemia.  Acidosis.  Respiratory distress syndrome.  2,3-DPG mutase deficiency.  Decreased levels of 2,3-DPG may also occur if you have had a blood transfusion. Talk with your health care provider to discuss your results, treatment options, and if necessary, the need for more tests. Talk with your health care provider if you have any questions about your results. This information is not intended to replace advice given to you by your health care provider. Make sure you discuss any questions you have with your health care provider. Document Released: 07/07/2004 Document Revised: 11/19/2013 Document Reviewed:  12/24/2012 Elsevier Interactive Patient Education  [employer].

## 2017-04-23 ENCOUNTER — Other Ambulatory Visit: Payer: BLUE CROSS/BLUE SHIELD

## 2017-04-23 ENCOUNTER — Other Ambulatory Visit (HOSPITAL_BASED_OUTPATIENT_CLINIC_OR_DEPARTMENT_OTHER): Payer: BLUE CROSS/BLUE SHIELD

## 2017-04-23 ENCOUNTER — Ambulatory Visit: Payer: BLUE CROSS/BLUE SHIELD | Admitting: Family

## 2017-04-23 ENCOUNTER — Ambulatory Visit (HOSPITAL_BASED_OUTPATIENT_CLINIC_OR_DEPARTMENT_OTHER): Payer: BLUE CROSS/BLUE SHIELD | Admitting: Family

## 2017-04-23 VITALS — BP 144/74 | HR 89 | Temp 97.9°F | Resp 18 | Wt 166.0 lb

## 2017-04-23 DIAGNOSIS — I7410 Embolism and thrombosis of unspecified parts of aorta: Secondary | ICD-10-CM

## 2017-04-23 DIAGNOSIS — D500 Iron deficiency anemia secondary to blood loss (chronic): Secondary | ICD-10-CM

## 2017-04-23 DIAGNOSIS — K5190 Ulcerative colitis, unspecified, without complications: Secondary | ICD-10-CM

## 2017-04-23 DIAGNOSIS — D510 Vitamin B12 deficiency anemia due to intrinsic factor deficiency: Secondary | ICD-10-CM

## 2017-04-23 LAB — CBC WITH DIFFERENTIAL (CANCER CENTER ONLY)
BASO#: 0.1 10*3/uL (ref 0.0–0.2)
BASO%: 0.5 % (ref 0.0–2.0)
EOS ABS: 0.4 10*3/uL (ref 0.0–0.5)
EOS%: 2.8 % (ref 0.0–7.0)
HEMATOCRIT: 46.9 % — AB (ref 34.8–46.6)
HEMOGLOBIN: 16 g/dL — AB (ref 11.6–15.9)
LYMPH#: 2.3 10*3/uL (ref 0.9–3.3)
LYMPH%: 18.7 % (ref 14.0–48.0)
MCH: 34.8 pg — ABNORMAL HIGH (ref 26.0–34.0)
MCHC: 34.1 g/dL (ref 32.0–36.0)
MCV: 102 fL — ABNORMAL HIGH (ref 81–101)
MONO#: 0.8 10*3/uL (ref 0.1–0.9)
MONO%: 6.5 % (ref 0.0–13.0)
NEUT%: 71.5 % (ref 39.6–80.0)
NEUTROS ABS: 8.8 10*3/uL — AB (ref 1.5–6.5)
Platelets: 407 10*3/uL — ABNORMAL HIGH (ref 145–400)
RBC: 4.6 10*6/uL (ref 3.70–5.32)
RDW: 14.9 % (ref 11.1–15.7)
WBC: 12.3 10*3/uL — ABNORMAL HIGH (ref 3.9–10.0)

## 2017-04-23 LAB — IRON AND TIBC
%SAT: 27 % (ref 21–57)
IRON: 74 ug/dL (ref 41–142)
TIBC: 275 ug/dL (ref 236–444)
UIBC: 201 ug/dL (ref 120–384)

## 2017-04-23 LAB — FERRITIN: Ferritin: 122 ng/ml (ref 9–269)
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
## 2017-04-24 LAB — RETICULOCYTES: RETICULOCYTE COUNT: 1.4 % (ref 0.6–2.6)

## 2017-06-21 ENCOUNTER — Ambulatory Visit (HOSPITAL_BASED_OUTPATIENT_CLINIC_OR_DEPARTMENT_OTHER): Payer: BLUE CROSS/BLUE SHIELD | Admitting: Family

## 2017-06-21 ENCOUNTER — Other Ambulatory Visit: Payer: Self-pay

## 2017-06-21 ENCOUNTER — Other Ambulatory Visit (HOSPITAL_BASED_OUTPATIENT_CLINIC_OR_DEPARTMENT_OTHER): Payer: BLUE CROSS/BLUE SHIELD

## 2017-06-21 ENCOUNTER — Encounter: Payer: Self-pay | Admitting: Family

## 2017-06-21 VITALS — BP 148/81 | HR 90 | Temp 98.1°F | Resp 20 | Wt 166.0 lb

## 2017-06-21 DIAGNOSIS — K50918 Crohn's disease, unspecified, with other complication: Secondary | ICD-10-CM | POA: Diagnosis not present

## 2017-06-21 DIAGNOSIS — I7410 Embolism and thrombosis of unspecified parts of aorta: Secondary | ICD-10-CM | POA: Diagnosis not present

## 2017-06-21 DIAGNOSIS — D510 Vitamin B12 deficiency anemia due to intrinsic factor deficiency: Secondary | ICD-10-CM | POA: Diagnosis not present

## 2017-06-21 DIAGNOSIS — D500 Iron deficiency anemia secondary to blood loss (chronic): Secondary | ICD-10-CM

## 2017-06-21 DIAGNOSIS — I749 Embolism and thrombosis of unspecified artery: Secondary | ICD-10-CM

## 2017-06-21 LAB — CBC WITH DIFFERENTIAL (CANCER CENTER ONLY)
BASO#: 0.1 10*3/uL (ref 0.0–0.2)
BASO%: 0.6 % (ref 0.0–2.0)
EOS%: 2.8 % (ref 0.0–7.0)
Eosinophils Absolute: 0.4 10*3/uL (ref 0.0–0.5)
HCT: 38.1 % (ref 34.8–46.6)
HGB: 12.9 g/dL (ref 11.6–15.9)
LYMPH#: 2.1 10*3/uL (ref 0.9–3.3)
LYMPH%: 14.9 % (ref 14.0–48.0)
MCH: 34.5 pg — ABNORMAL HIGH (ref 26.0–34.0)
MCHC: 33.9 g/dL (ref 32.0–36.0)
MCV: 102 fL — ABNORMAL HIGH (ref 81–101)
MONO#: 1.2 10*3/uL — ABNORMAL HIGH (ref 0.1–0.9)
MONO%: 8.3 % (ref 0.0–13.0)
NEUT#: 10.3 10*3/uL — ABNORMAL HIGH (ref 1.5–6.5)
NEUT%: 73.4 % (ref 39.6–80.0)
PLATELETS: 602 10*3/uL — AB (ref 145–400)
RBC: 3.74 10*6/uL (ref 3.70–5.32)
RDW: 13.5 % (ref 11.1–15.7)
WBC: 14 10*3/uL — AB (ref 3.9–10.0)

## 2017-06-21 LAB — FERRITIN: FERRITIN: 31 ng/mL (ref 9–269)

## 2017-06-21 LAB — IRON AND TIBC
%SAT: 15 % — ABNORMAL LOW (ref 21–57)
Iron: 51 ug/dL (ref 41–142)
TIBC: 338 ug/dL (ref 236–444)
UIBC: 286 ug/dL (ref 120–384)

## 2017-06-21 NOTE — Progress Notes (Signed)
Hematology and Oncology Follow Up Visit  [facility] [id number] 12-Dec-1970 46 y.o. 06/21/2017   Principle Diagnosis:  Idiopathic aortic thrombosis Pernicious anemia Iron deficiency anemia Crohn's disease  Current Therapy:   Vitamin B12 PO BID daily IV iron as needed - last received in September 2018   Interim History:  Kari Hahn is here today for follow-up. She is symptomatic at this time with fatigue and weakness.  She has had some diarrhea with her Crohn's disease and had a small amount of bright red blood at times when she has strained. No other bleeding, no bruising or petechiae.  She lost her father in law and brother in law right before Christmas so this was a hard holiday season for her family.  She has had no fever, chills, cough, rash, dizziness, SOB, chest pain, palpitations, abdominal pain or changes in bowel or bladder habits.  She is still smoking.  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 No swelling or tenderness present in her extremities at this time.  She has occasional nausea without vomiting and takes Phenergan as needed. Despite this, she has maintained a god appetite and is staying well hydrated. Her weight is stable.   ECOG Performance Status: 1 - Symptomatic but completely ambulatory  Medications:  Allergies as of 06/21/2017      Reactions   Eggs Or Egg-derived Products    Ibuprofen       Medication List        Accurate as of 06/21/17 10:28 AM. Always use your most recent med list.          cyclobenzaprine 10 MG tablet Commonly known as:  FLEXERIL   EXFORGE 5-160 MG tablet Generic drug:  amLODipine-valsartan 1 TABLET BY MOUTH DAILY FOR BLOOD PRESSURE   fluticasone 50 MCG/ACT nasal spray Commonly known as:  FLONASE   folic acid 1 MG tablet Commonly known as:  FOLVITE Take 1 mg by mouth daily.   gabapentin 800 MG tablet Commonly known as:  NEURONTIN   LEXAPRO 20 MG tablet Generic drug:  escitalopram Take 20 mg by mouth  daily. 2  20 mg tab = 40 mg daily   omeprazole 40 MG capsule Commonly known as:  PRILOSEC Take 40 mg by mouth daily.   oxyCODONE-acetaminophen 5-325 MG tablet Commonly known as:  PERCOCET/ROXICET Take 1-2 tablets by mouth every 6 (six) hours as needed for severe pain.   oxyCODONE-acetaminophen 10-325 MG tablet Commonly known as:  PERCOCET   potassium chloride SA 20 MEQ tablet Commonly known as:  K-DUR,KLOR-CON Take 20 mEq by mouth daily.   promethazine 25 MG tablet Commonly known as:  PHENERGAN Take 25 mg by mouth every 6 (six) hours as needed.   sulfaSALAzine 500 MG EC tablet Commonly known as:  AZULFIDINE Take 1,000 mg by mouth 4 (four) times daily.   valACYclovir 500 MG tablet Commonly known as:  VALTREX as needed.   VITAMIN B 12 PO Take by mouth 2 (two) times daily.   vitamin C 1000 MG tablet Take 1,000 mg by mouth daily.   Vitamin D3 5000 units Tabs Take by mouth every morning.   zinc gluconate 50 MG tablet Take 50 mg by mouth daily.       Allergies:  Allergies  Allergen Reactions  . Eggs Or Egg-Derived Products   . Ibuprofen     Past Medical History, Surgical history, Social history, and Family History were reviewed and updated.  Review of Systems: All other 10 point review of  systems is negative.   Physical Exam:  vitals were not taken for this visit.   Wt Readings from Last 3 Encounters:  04/23/17 166 lb (75.3 kg)  03/12/17 167 lb (75.8 kg)  09/22/16 169 lb (76.7 kg)    Ocular: Sclerae unicteric, pupils equal, round and reactive to light Ear-nose-throat: Oropharynx clear, dentition fair Lymphatic: No cervical, supraclavicular or axillary adenopathy Lungs no rales or rhonchi, good excursion bilaterally Heart regular rate and rhythm, no murmur appreciated Abd soft, nontender, positive bowel sounds, no liver or spleen tip palpated on exam, no fluid wave  MSK no focal spinal tenderness, no joint edema Neuro: non-focal, well-oriented,  appropriate affect Breasts: Deferred   Lab Results  Component Value Date   WBC 14.0 (H) 06/21/2017   HGB 12.9 06/21/2017   HCT 38.1 06/21/2017   MCV 102 (H) 06/21/2017   PLT 602 (H) 06/21/2017   Lab Results  Component Value Date   FERRITIN 122 04/23/2017   IRON 74 04/23/2017   TIBC 275 04/23/2017   UIBC 201 04/23/2017   IRONPCTSAT 27 04/23/2017   Lab Results  Component Value Date   RETICCTPCT 2.4 (H) 12/21/2014   RBC 3.74 06/21/2017   RETICCTABS 88.8 12/21/2014   No results found for: KPAFRELGTCHN, LAMBDASER, KAPLAMBRATIO No results found for: IGGSERUM, IGA, IGMSERUM No results found for: Odetta Pink, SPEI   Chemistry      Component Value Date/Time   NA 141 03/12/2017 0929   K 2.8 (LL) 03/12/2017 0929   CL 101 06/18/2013 0030   CL 106 12/08/2011 1145   CO2 27 03/12/2017 0929   BUN 9.4 03/12/2017 0929   CREATININE 1.2 (H) 03/12/2017 0929      Component Value Date/Time   CALCIUM 9.5 03/12/2017 0929   ALKPHOS 125 03/12/2017 0929   AST 13 03/12/2017 0929   ALT 9 03/12/2017 0929   BILITOT <0.22 03/12/2017 0929      Impression and Plan: Kari Hahn is a very pleasant 46 yo caucasian female with history of idiopathic thrombosis and iron deficiency anemia secondary to chronic GI blood loss with Crohn's disease. She is symptomatic at this time with fatigue and weakness. She has had some bright red blood in her stool when she strains with diarrhea due to flare.  We will see what her iron studies show and bring her back in next week for an infusion if needed.  We will go ahead and plan to see her back again in another 6 weeks for follow-up.  She will contact our office with any questions or concerns. We can certainly see her sooner if need be.     [name], NP 12/27/201810:28 AM

## 2017-06-22 LAB — RETICULOCYTES: Reticulocyte Count: 2.8 % — ABNORMAL HIGH (ref 0.6–2.6)

## 2017-07-03 ENCOUNTER — Inpatient Hospital Stay: Payer: BLUE CROSS/BLUE SHIELD

## 2017-07-10 ENCOUNTER — Inpatient Hospital Stay: Payer: BLUE CROSS/BLUE SHIELD | Attending: Hematology & Oncology

## 2017-07-10 ENCOUNTER — Other Ambulatory Visit: Payer: Self-pay

## 2017-07-10 VITALS — BP 137/69 | HR 77 | Temp 98.4°F | Resp 18

## 2017-07-10 DIAGNOSIS — K5090 Crohn's disease, unspecified, without complications: Secondary | ICD-10-CM | POA: Diagnosis present

## 2017-07-10 DIAGNOSIS — D509 Iron deficiency anemia, unspecified: Secondary | ICD-10-CM

## 2017-07-10 DIAGNOSIS — D500 Iron deficiency anemia secondary to blood loss (chronic): Secondary | ICD-10-CM | POA: Diagnosis not present

## 2017-07-10 MED ORDER — SODIUM CHLORIDE 0.9 % IV SOLN
510.0000 mg | Freq: Once | INTRAVENOUS | Status: AC
  Administered 2017-07-10: 510 mg via INTRAVENOUS
  Filled 2017-07-10: qty 17

## 2017-07-10 NOTE — Patient Instructions (Signed)

## 2017-07-10 NOTE — Progress Notes (Signed)
Patient refused to wait 30 min after infusion. VS WNL at discharge, patient feels fine.

## 2017-08-02 ENCOUNTER — Other Ambulatory Visit: Payer: BLUE CROSS/BLUE SHIELD

## 2017-08-02 ENCOUNTER — Ambulatory Visit: Payer: BLUE CROSS/BLUE SHIELD | Admitting: Family

## 2017-08-09 ENCOUNTER — Inpatient Hospital Stay: Payer: BLUE CROSS/BLUE SHIELD | Attending: Hematology & Oncology

## 2017-08-09 ENCOUNTER — Inpatient Hospital Stay (HOSPITAL_BASED_OUTPATIENT_CLINIC_OR_DEPARTMENT_OTHER): Payer: BLUE CROSS/BLUE SHIELD | Admitting: Family

## 2017-08-09 ENCOUNTER — Other Ambulatory Visit: Payer: Self-pay

## 2017-08-09 VITALS — BP 145/86 | HR 103 | Temp 98.2°F | Resp 20 | Wt 170.8 lb

## 2017-08-09 DIAGNOSIS — G629 Polyneuropathy, unspecified: Secondary | ICD-10-CM | POA: Diagnosis not present

## 2017-08-09 DIAGNOSIS — I7410 Embolism and thrombosis of unspecified parts of aorta: Secondary | ICD-10-CM

## 2017-08-09 DIAGNOSIS — K50918 Crohn's disease, unspecified, with other complication: Secondary | ICD-10-CM

## 2017-08-09 DIAGNOSIS — D510 Vitamin B12 deficiency anemia due to intrinsic factor deficiency: Secondary | ICD-10-CM

## 2017-08-09 DIAGNOSIS — Z720 Tobacco use: Secondary | ICD-10-CM | POA: Diagnosis not present

## 2017-08-09 DIAGNOSIS — D500 Iron deficiency anemia secondary to blood loss (chronic): Secondary | ICD-10-CM

## 2017-08-09 LAB — CBC WITH DIFFERENTIAL (CANCER CENTER ONLY)
BASOS PCT: 0 %
Basophils Absolute: 0.1 10*3/uL (ref 0.0–0.1)
Eosinophils Absolute: 0.3 10*3/uL (ref 0.0–0.5)
Eosinophils Relative: 2 %
HEMATOCRIT: 43.7 % (ref 34.8–46.6)
HEMOGLOBIN: 14.7 g/dL (ref 11.6–15.9)
LYMPHS ABS: 2.1 10*3/uL (ref 0.9–3.3)
Lymphocytes Relative: 16 %
MCH: 34.6 pg — ABNORMAL HIGH (ref 26.0–34.0)
MCHC: 33.6 g/dL (ref 32.0–36.0)
MCV: 102.8 fL — ABNORMAL HIGH (ref 81.0–101.0)
MONOS PCT: 6 %
Monocytes Absolute: 0.9 10*3/uL (ref 0.1–0.9)
NEUTROS ABS: 10.2 10*3/uL — AB (ref 1.5–6.5)
NEUTROS PCT: 76 %
Platelet Count: 450 10*3/uL — ABNORMAL HIGH (ref 145–400)
RBC: 4.25 MIL/uL (ref 3.70–5.32)
RDW: 13.4 % (ref 11.1–15.7)
WBC: 13.5 10*3/uL — AB (ref 3.9–10.0)

## 2017-08-09 LAB — FERRITIN: FERRITIN: 56 ng/mL (ref 9–269)

## 2017-08-09 LAB — IRON AND TIBC
IRON: 50 ug/dL (ref 41–142)
SATURATION RATIOS: 17 % — AB (ref 21–57)
TIBC: 300 ug/dL (ref 236–444)
UIBC: 250 ug/dL

## 2017-08-09 LAB — RETICULOCYTES
RBC.: 4.24 MIL/uL (ref 3.70–5.45)
RETIC CT PCT: 2.5 % — AB (ref 0.7–2.1)
Retic Count, Absolute: 106 10*3/uL — ABNORMAL HIGH (ref 33.7–90.7)

## 2017-08-09 NOTE — Progress Notes (Signed)
Hematology and Oncology Follow Up Visit  Kari Hahn [id number] 02/14/1971 47 y.o. 08/09/2017   Principle Diagnosis:  Idiopathic aortic thrombosis Pernicious anemia Iron deficiency anemia Crohn's disease  Current Therapy:   Vitamin B12 PO BID daily IV iron as needed - last received in January2019   Interim History:  Kari Hahn is here today with her son Olen Cordial for follow-up. She is feeling fatigued.  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  She is still smoking.  No fever, chills, cough, rash, dizziness, SOB, chest pain, palpitations, abdominal pain or changes in bowel or bladder habits.  No lymphadenopathy found on exam.  No swelling or tenderness in her extremities at this time. 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  She has maintained a good appetite and is staying well hydrated. Her weight is stable.   ECOG Performance Status: 1 - Symptomatic but completely ambulatory  Medications:  Allergies as of 08/09/2017      Reactions   Eggs Or Egg-derived Products    Ibuprofen       Medication List        Accurate as of 08/09/17  9:22 AM. Always use your most recent med list.          baclofen 10 MG tablet Commonly known as:  LIORESAL   cyclobenzaprine 10 MG tablet Commonly known as:  FLEXERIL   EXFORGE 5-160 MG tablet Generic drug:  amLODipine-valsartan 1 TABLET BY MOUTH DAILY FOR BLOOD PRESSURE   fluticasone 50 MCG/ACT nasal spray Commonly known as:  FLONASE   folic acid 1 MG tablet Commonly known as:  FOLVITE Take 1 mg by mouth daily.   gabapentin 800 MG tablet Commonly known as:  NEURONTIN   LEXAPRO 20 MG tablet Generic drug:  escitalopram Take 20 mg by mouth daily. 2  20 mg tab = 40 mg daily   omeprazole 40 MG capsule Commonly known as:   PRILOSEC Take 40 mg by mouth daily.   oxyCODONE-acetaminophen 5-325 MG tablet Commonly known as:  PERCOCET/ROXICET Take 1-2 tablets by mouth every 6 (six) hours as needed for severe pain.   oxyCODONE-acetaminophen 10-325 MG tablet Commonly known as:  PERCOCET   potassium chloride SA 20 MEQ tablet Commonly known as:  K-DUR,KLOR-CON Take 20 mEq by mouth daily.   promethazine 25 MG tablet Commonly known as:  PHENERGAN Take 25 mg by mouth every 6 (six) hours as needed.   sulfaSALAzine 500 MG EC tablet Commonly known as:  AZULFIDINE Take 1,000 mg by mouth 4 (four) times daily.   valACYclovir 500 MG tablet Commonly known as:  VALTREX as needed.   VITAMIN B 12 PO Take by mouth 2 (two) times daily.   vitamin C 1000 MG tablet Take 1,000 mg by mouth daily.   Vitamin D3 5000 units Tabs Take by mouth every morning.   zinc gluconate 50 MG tablet Take 50 mg by mouth daily.       Allergies:  Allergies  Allergen Reactions  . Eggs Or Egg-Derived Products   . Ibuprofen     Past Medical History, Surgical history, Social history, and Family History were reviewed and updated.  Review of Systems: All other 10 point review of systems is negative.   Physical Exam:  vitals were not taken for this visit.   Wt  Readings from Last 3 Encounters:  06/21/17 166 lb (75.3 kg)  04/23/17 166 lb (75.3 kg)  03/12/17 167 lb (75.8 kg)    Ocular: Sclerae unicteric, pupils equal, round and reactive to light Ear-nose-throat: Oropharynx clear, dentition fair Lymphatic: No cervical, supraclavicular or axillary adenopathy Lungs no rales or rhonchi, good excursion bilaterally Heart regular rate and rhythm, no murmur appreciated Abd soft, nontender, positive bowel sounds, no liver or spleen tip palpated on exam, no fluid wave  MSK no focal spinal tenderness, no joint edema Neuro: non-focal, well-oriented, appropriate affect Breasts: Deferred   Lab Results  Component Value Date   WBC 13.5  (H) 08/09/2017   HGB 12.9 06/21/2017   HCT 43.7 08/09/2017   MCV 102.8 (H) 08/09/2017   PLT 450 (H) 08/09/2017   Lab Results  Component Value Date   FERRITIN 31 06/21/2017   IRON 51 06/21/2017   TIBC 338 06/21/2017   UIBC 286 06/21/2017   IRONPCTSAT 15 (L) 06/21/2017   Lab Results  Component Value Date   RETICCTPCT 2.4 (H) 12/21/2014   RBC 4.25 08/09/2017   RETICCTABS 88.8 12/21/2014   No results found for: KPAFRELGTCHN, LAMBDASER, KAPLAMBRATIO No results found for: IGGSERUM, IGA, IGMSERUM No results found for: Odetta Pink, SPEI   Chemistry      Component Value Date/Time   NA 141 03/12/2017 0929   K 2.8 (LL) 03/12/2017 0929   CL 101 06/18/2013 0030   CL 106 12/08/2011 1145   CO2 27 03/12/2017 0929   BUN 9.4 03/12/2017 0929   CREATININE 1.2 (H) 03/12/2017 0929      Component Value Date/Time   CALCIUM 9.5 03/12/2017 0929   ALKPHOS 125 03/12/2017 0929   AST 13 03/12/2017 0929   ALT 9 03/12/2017 0929   BILITOT <0.22 03/12/2017 0929      Impression and Plan: Kari Hahn is a very pleasant 47 yo caucasian female with history of idiopathic aortic thrombosis and iron deficiency anemia secondary to chronic GI blood loss with Crohn's disease. She still has some fatigue.  We will see what her iron studies show and bring her back in for infusion if needed.  We will plan to see her back in another 6 weeks for follow-up and lab.  She will contact our office with any questions or concerns. We can certainly see her sooner if need be.   [name], NP 2/14/20199:22 AM

## 2017-08-13 ENCOUNTER — Other Ambulatory Visit: Payer: Self-pay

## 2017-08-13 ENCOUNTER — Other Ambulatory Visit: Payer: Self-pay | Admitting: Urology

## 2017-08-13 ENCOUNTER — Encounter (HOSPITAL_BASED_OUTPATIENT_CLINIC_OR_DEPARTMENT_OTHER): Payer: Self-pay | Admitting: *Deleted

## 2017-08-13 NOTE — Progress Notes (Signed)
SPOKE W/ PT VIA PHONE FOR PRE-OP INTERVIEW.  NPO AFTER MN.  ARRIVE AT 0530.  NEEDS ISTAT AND EKG.  WILL TAKE AM MEDS W/ SIPS OF WATER DOS.

## 2017-08-16 ENCOUNTER — Inpatient Hospital Stay: Payer: BLUE CROSS/BLUE SHIELD

## 2017-08-20 NOTE — H&P (Signed)
Urology Preoperative H&P   Chief Complaint: left flank pain  History of Present Illness: Kari Hahn is a 47 y.o. female referred by Dr. Sandi Hahn for bilateral renal stones with left hydronephrosis. She initially had a RUS in 07/2016 that demonstrated bilateral renal stones with no mention of hydronephrosis. She subsequently had a follow-up RUS this month that demonstrated severe left hydronephrosis. Creatinine 1.6 (08/04/17).   She has a remote history of kidney stones 15 year ago and required ESWL at that time. Denies interval stone passage. For the past 3 months, she reports intermittent, dull left flank pain associated with nausea, but no vomiting. Denies gross hematuria or fever/chills. From a voiding standpoint, she reports occasional dysuria and urgency.  She had a CTSS on 08/13/17 that demonstrated a 7 mm left UPJ stone with hydronephrosis.  Recent urine culture grew multiple organisms and she has been on empiric Cipro leading up to surgery.     Past Medical History:  Diagnosis Date  . Anemia, iron deficiency    followed by hemotologist-- dr Kari Hahn  . Anemia, pernicious   . Anxiety   . Arthritis    knees , back  . CKD (chronic kidney disease), stage III ([place])   . Crohn disease ([place])    dx 09/2007  . Frequency of urination   . GERD (gastroesophageal reflux disease)   . History of gastric ulcer    2009--- esophagus and gastrium ulcers, candida  . History of kidney stones   . 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 Hypertension   . Left ureteral stone   . Neuropathy of leg    right leg  . Urge urinary incontinence   . Wears glasses     Past Surgical History:  Procedure Laterality Date  . CESAREAN SECTION  x2  last one 1998  . CHOLECYSTECTOMY  03/29/2012   Procedure: LAPAROSCOPIC  CHOLECYSTECTOMY WITH INTRAOPERATIVE CHOLANGIOGRAM;  Surgeon: Kari Hollingshead, MD;  Location: [place];  Service: General;  Laterality: N/A;  laparoscopic cholecystectomy with intraopertaive choloangiogram  . CYSTOSCOPY W/ URETERAL STENT PLACEMENT  2005  APPROX.  Marland Kitchen EXTRACORPOREAL SHOCK WAVE LITHOTRIPSY  2005 approx.  Marland Kitchen KNEE SURGERY Left age 25  . LAPAROSCOPIC NISSEN FUNDOPLICATION  0349  approx.  . TRANSTHORACIC ECHOCARDIOGRAM  08/06/2009   ef 17%, grade 1 diastolic dysfunction/  mild LAE    Allergies:  Allergies  Allergen Reactions  . Eggs Or Egg-Derived Products Nausea And Vomiting  . Ibuprofen Other (See Comments)    stomache upset    History reviewed. No pertinent family history.  Social History:  reports that she has been smoking cigarettes.  She started smoking about 32 years ago. She has a 68.00 pack-year smoking history. she has never used smokeless tobacco. She reports that she does not drink alcohol or use drugs.  ROS: A complete review of systems was performed.  All systems are negative except for pertinent findings as noted.  Physical Exam:  Vital signs in last 24 hours:   Constitutional:  Alert and oriented, No acute distress Cardiovascular: Regular rate and rhythm, No JVD Respiratory: Normal respiratory effort, Lungs clear bilaterally GI: Abdomen is soft, nontender, nondistended, no abdominal masses GU: Left CVA tenderness Lymphatic: No lymphadenopathy Neurologic: Grossly intact, no focal deficits Psychiatric: Normal mood and affect  Laboratory Data:  No results for input(s): WBC, HGB, HCT, PLT in the last 72 hours.  No results for input(s): NA, K, CL, GLUCOSE, BUN, CALCIUM, CREATININE in the last 72 hours.  Invalid input(s): CO3   No results found for this or any previous visit (from the past 24 hour(s)). No results found for this or any previous visit (from the past 240 hour(s)).  Renal Function: No results for input(s): CREATININE in the last 168  hours. CrCl cannot be calculated (Patient's most recent lab result is older than the maximum 21 days allowed.).  Radiologic Imaging: No results found.  I independently reviewed the above imaging studies.  Assessment and Plan Kari Hahn is a 47 y.o. female with a 7 mm left UPJ stone with hydronephrosis  -The risks, benefits and alternatives of cystoscopy with left ureteroscopy, laser lithotripsy and ureteral stent placement was discussed the patient. Risks included, but are not limited to: bleeding, urinary tract infection, ureteral injury/avulsion, ureteral stricture formation, retained stone fragments, the possibility that multiple surgeries may be required to treat the stone(s) and the inherent risks of general anesthesia. The patient voices understanding and wishes to proceed.    Kari Hughs, MD 08/20/2017, 3:26 PM  [facility] Pager: [phone]

## 2017-08-21 ENCOUNTER — Encounter (HOSPITAL_BASED_OUTPATIENT_CLINIC_OR_DEPARTMENT_OTHER)
Admission: RE | Disposition: A | Discharge status: Home / Self Care | Payer: Self-pay | Source: Ambulatory Visit | Attending: Urology

## 2017-08-21 ENCOUNTER — Ambulatory Visit (HOSPITAL_BASED_OUTPATIENT_CLINIC_OR_DEPARTMENT_OTHER): Payer: BLUE CROSS/BLUE SHIELD | Admitting: Anesthesiology

## 2017-08-21 ENCOUNTER — Other Ambulatory Visit: Payer: Self-pay

## 2017-08-21 ENCOUNTER — Encounter (HOSPITAL_BASED_OUTPATIENT_CLINIC_OR_DEPARTMENT_OTHER): Payer: Self-pay | Admitting: *Deleted

## 2017-08-21 ENCOUNTER — Ambulatory Visit (HOSPITAL_BASED_OUTPATIENT_CLINIC_OR_DEPARTMENT_OTHER)
Admission: RE | Admit: 2017-08-21 | Discharge: 2017-08-21 | Disposition: A | Discharge status: Home / Self Care | Payer: BLUE CROSS/BLUE SHIELD | Source: Ambulatory Visit | Attending: Urology | Admitting: Urology

## 2017-08-21 DIAGNOSIS — K5090 Crohn's disease, unspecified, without complications: Secondary | ICD-10-CM | POA: Diagnosis not present

## 2017-08-21 DIAGNOSIS — N132 Hydronephrosis with renal and ureteral calculous obstruction: Secondary | ICD-10-CM | POA: Insufficient documentation

## 2017-08-21 DIAGNOSIS — G629 Polyneuropathy, unspecified: Secondary | ICD-10-CM | POA: Insufficient documentation

## 2017-08-21 DIAGNOSIS — Z87442 Personal history of urinary calculi: Secondary | ICD-10-CM | POA: Insufficient documentation

## 2017-08-21 DIAGNOSIS — F419 Anxiety disorder, unspecified: Secondary | ICD-10-CM | POA: Diagnosis not present

## 2017-08-21 DIAGNOSIS — D509 Iron deficiency anemia, unspecified: Secondary | ICD-10-CM | POA: Insufficient documentation

## 2017-08-21 DIAGNOSIS — D510 Vitamin B12 deficiency anemia due to intrinsic factor deficiency: Secondary | ICD-10-CM | POA: Insufficient documentation

## 2017-08-21 DIAGNOSIS — Z7951 Long term (current) use of inhaled steroids: Secondary | ICD-10-CM | POA: Diagnosis not present

## 2017-08-21 DIAGNOSIS — Z6831 Body mass index (BMI) 31.0-31.9, adult: Secondary | ICD-10-CM | POA: Insufficient documentation

## 2017-08-21 DIAGNOSIS — N179 Acute kidney failure, unspecified: Secondary | ICD-10-CM | POA: Diagnosis not present

## 2017-08-21 DIAGNOSIS — I129 Hypertensive chronic kidney disease with stage 1 through stage 4 chronic kidney disease, or unspecified chronic kidney disease: Secondary | ICD-10-CM | POA: Diagnosis not present

## 2017-08-21 DIAGNOSIS — K219 Gastro-esophageal reflux disease without esophagitis: Secondary | ICD-10-CM | POA: Insufficient documentation

## 2017-08-21 DIAGNOSIS — N183 Chronic kidney disease, stage 3 (moderate): Secondary | ICD-10-CM | POA: Insufficient documentation

## 2017-08-21 DIAGNOSIS — Z79899 Other long term (current) drug therapy: Secondary | ICD-10-CM | POA: Insufficient documentation

## 2017-08-21 DIAGNOSIS — F329 Major depressive disorder, single episode, unspecified: Secondary | ICD-10-CM | POA: Insufficient documentation

## 2017-08-21 DIAGNOSIS — F17210 Nicotine dependence, cigarettes, uncomplicated: Secondary | ICD-10-CM | POA: Insufficient documentation

## 2017-08-21 HISTORY — DX: Personal history of peptic ulcer disease: Z87.11

## 2017-08-21 HISTORY — DX: Personal history of other diseases of the digestive system: Z87.19

## 2017-08-21 HISTORY — DX: Unspecified osteoarthritis, unspecified site: M19.90

## 2017-08-21 HISTORY — DX: Personal history of urinary calculi: Z87.442

## 2017-08-21 HISTORY — DX: Chronic kidney disease, stage 3 unspecified: N18.30

## 2017-08-21 HISTORY — DX: Frequency of micturition: R35.0

## 2017-08-21 HISTORY — DX: Urge incontinence: N39.41

## 2017-08-21 HISTORY — DX: Personal history of other venous thrombosis and embolism: Z86.718

## 2017-08-21 HISTORY — DX: Presence of spectacles and contact lenses: Z97.3

## 2017-08-21 HISTORY — DX: Chronic kidney disease, stage 3 (moderate): N18.3

## 2017-08-21 HISTORY — PX: CYSTOSCOPY/URETEROSCOPY/HOLMIUM LASER/STENT PLACEMENT: SHX6546

## 2017-08-21 LAB — POCT I-STAT 4, (NA,K, GLUC, HGB,HCT)
Glucose, Bld: 89 mg/dL (ref 65–99)
HEMATOCRIT: 44 % (ref 36.0–46.0)
Hemoglobin: 15 g/dL (ref 12.0–15.0)
Potassium: 3.2 mmol/L — ABNORMAL LOW (ref 3.5–5.1)
Sodium: 141 mmol/L (ref 135–145)

## 2017-08-21 LAB — POCT PREGNANCY, URINE: PREG TEST UR: NEGATIVE

## 2017-08-21 SURGERY — CYSTOSCOPY/URETEROSCOPY/HOLMIUM LASER/STENT PLACEMENT
Anesthesia: General | Laterality: Left

## 2017-08-21 MED ORDER — MIDAZOLAM HCL 2 MG/2ML IJ SOLN
INTRAMUSCULAR | Status: AC
  Filled 2017-08-21: qty 2

## 2017-08-21 MED ORDER — DEXAMETHASONE SODIUM PHOSPHATE 10 MG/ML IJ SOLN
INTRAMUSCULAR | Status: DC | PRN
  Administered 2017-08-21: 10 mg via INTRAVENOUS

## 2017-08-21 MED ORDER — ONDANSETRON HCL 4 MG/2ML IJ SOLN
INTRAMUSCULAR | Status: DC | PRN
  Administered 2017-08-21: 4 mg via INTRAVENOUS

## 2017-08-21 MED ORDER — ONDANSETRON HCL 4 MG/2ML IJ SOLN
INTRAMUSCULAR | Status: AC
  Filled 2017-08-21: qty 2

## 2017-08-21 MED ORDER — OXYCODONE HCL 5 MG PO TABS
5.0000 mg | ORAL_TABLET | Freq: Once | ORAL | Status: DC | PRN
Start: 2017-08-21 — End: 2017-08-21
  Filled 2017-08-21: qty 1

## 2017-08-21 MED ORDER — LIDOCAINE 2% (20 MG/ML) 5 ML SYRINGE
INTRAMUSCULAR | Status: AC
  Filled 2017-08-21: qty 5

## 2017-08-21 MED ORDER — ONDANSETRON HCL 4 MG PO TABS: 4.0000 mg | ORAL_TABLET | Freq: Every day | ORAL | 1 refills | Status: DC | PRN

## 2017-08-21 MED ORDER — CEFAZOLIN SODIUM-DEXTROSE 2-4 GM/100ML-% IV SOLN
INTRAVENOUS | Status: AC
  Filled 2017-08-21: qty 100

## 2017-08-21 MED ORDER — OXYCODONE-ACETAMINOPHEN 7.5-325 MG PO TABS: 1.0000 | ORAL_TABLET | ORAL | 0 refills | Status: DC | PRN

## 2017-08-21 MED ORDER — PROPOFOL 10 MG/ML IV BOLUS
INTRAVENOUS | Status: AC
  Filled 2017-08-21: qty 40

## 2017-08-21 MED ORDER — MIDAZOLAM HCL 2 MG/2ML IJ SOLN
INTRAMUSCULAR | Status: DC | PRN
  Administered 2017-08-21: 1 mg via INTRAVENOUS

## 2017-08-21 MED ORDER — CEFAZOLIN SODIUM-DEXTROSE 2-4 GM/100ML-% IV SOLN
2.0000 g | Freq: Once | INTRAVENOUS | Status: AC
  Administered 2017-08-21: 2 g via INTRAVENOUS
  Filled 2017-08-21: qty 100

## 2017-08-21 MED ORDER — DEXAMETHASONE SODIUM PHOSPHATE 10 MG/ML IJ SOLN
INTRAMUSCULAR | Status: AC
  Filled 2017-08-21: qty 1

## 2017-08-21 MED ORDER — LIDOCAINE 2% (20 MG/ML) 5 ML SYRINGE
INTRAMUSCULAR | Status: DC | PRN
  Administered 2017-08-21: 60 mg via INTRAVENOUS

## 2017-08-21 MED ORDER — SODIUM CHLORIDE 0.9 % IV SOLN
INTRAVENOUS | Status: DC
  Administered 2017-08-21: 1000 mL via INTRAVENOUS
  Filled 2017-08-21: qty 1000

## 2017-08-21 MED ORDER — OXYCODONE HCL 5 MG/5ML PO SOLN
5.0000 mg | Freq: Once | ORAL | Status: DC | PRN
  Filled 2017-08-21: qty 5

## 2017-08-21 MED ORDER — IOHEXOL 300 MG/ML  SOLN
INTRAMUSCULAR | Status: DC | PRN
  Administered 2017-08-21: 10 mL via URETHRAL

## 2017-08-21 MED ORDER — FENTANYL CITRATE (PF) 100 MCG/2ML IJ SOLN
INTRAMUSCULAR | Status: DC | PRN
  Administered 2017-08-21: 50 ug via INTRAVENOUS

## 2017-08-21 MED ORDER — PHENAZOPYRIDINE HCL 200 MG PO TABS: 200.0000 mg | ORAL_TABLET | Freq: Three times a day (TID) | ORAL | 0 refills | Status: DC | PRN

## 2017-08-21 MED ORDER — PROMETHAZINE HCL 25 MG/ML IJ SOLN
6.2500 mg | INTRAMUSCULAR | Status: DC | PRN
  Filled 2017-08-21: qty 1

## 2017-08-21 MED ORDER — HYDROMORPHONE HCL 1 MG/ML IJ SOLN
0.2500 mg | INTRAMUSCULAR | Status: DC | PRN
  Filled 2017-08-21: qty 0.5

## 2017-08-21 MED ORDER — MEPERIDINE HCL 25 MG/ML IJ SOLN
6.2500 mg | INTRAMUSCULAR | Status: DC | PRN
  Filled 2017-08-21: qty 1

## 2017-08-21 MED ORDER — PROPOFOL 10 MG/ML IV BOLUS
INTRAVENOUS | Status: DC | PRN
  Administered 2017-08-21: 150 mg via INTRAVENOUS

## 2017-08-21 MED ORDER — FENTANYL CITRATE (PF) 100 MCG/2ML IJ SOLN
INTRAMUSCULAR | Status: AC
  Filled 2017-08-21: qty 2

## 2017-08-21 MED ORDER — ARTIFICIAL TEARS OPHTHALMIC OINT
TOPICAL_OINTMENT | OPHTHALMIC | Status: AC
  Filled 2017-08-21: qty 3.5

## 2017-08-21 SURGICAL SUPPLY — 26 items
BAG DRAIN URO-CYSTO SKYTR STRL (DRAIN) ×3 IMPLANT
BASKET STONE 1.7 NGAGE (UROLOGICAL SUPPLIES) IMPLANT
BASKET ZERO TIP NITINOL 2.4FR (BASKET) ×3 IMPLANT
BENZOIN TINCTURE PRP APPL 2/3 (GAUZE/BANDAGES/DRESSINGS) IMPLANT
CATH URET 5FR 28IN OPEN ENDED (CATHETERS) ×3 IMPLANT
CLOSURE WOUND 1/2 X4 (GAUZE/BANDAGES/DRESSINGS)
CLOTH BEACON ORANGE TIMEOUT ST (SAFETY) ×3 IMPLANT
FIBER LASER FLEXIVA 365 (UROLOGICAL SUPPLIES) IMPLANT
FIBER LASER TRAC TIP (UROLOGICAL SUPPLIES) ×3 IMPLANT
GLOVE BIO SURGEON STRL SZ7.5 (GLOVE) ×3 IMPLANT
GOWN STRL REUS W/TWL XL LVL3 (GOWN DISPOSABLE) ×3 IMPLANT
GUIDEWIRE ANG ZIPWIRE 038X150 (WIRE) ×3 IMPLANT
GUIDEWIRE STR DUAL SENSOR (WIRE) ×3 IMPLANT
INFUSOR MANOMETER BAG 3000ML (MISCELLANEOUS) ×3 IMPLANT
IV NS 1000ML (IV SOLUTION) ×2
IV NS 1000ML BAXH (IV SOLUTION) ×1 IMPLANT
IV NS IRRIG 3000ML ARTHROMATIC (IV SOLUTION) ×3 IMPLANT
KIT TURNOVER CYSTO (KITS) ×3 IMPLANT
MANIFOLD NEPTUNE II (INSTRUMENTS) ×3 IMPLANT
NS IRRIG 500ML POUR BTL (IV SOLUTION) IMPLANT
PACK CYSTO (CUSTOM PROCEDURE TRAY) ×3 IMPLANT
STENT URET 6FRX26 CONTOUR (STENTS) ×3 IMPLANT
STRIP CLOSURE SKIN 1/2X4 (GAUZE/BANDAGES/DRESSINGS) IMPLANT
SYRINGE 10CC LL (SYRINGE) ×3 IMPLANT
TUBE CONNECTING 12'X1/4 (SUCTIONS)
TUBE CONNECTING 12X1/4 (SUCTIONS) IMPLANT

## 2017-08-21 NOTE — Op Note (Signed)
Operative Note  Preoperative diagnosis:  1.  7 mm left UPJ stone  Postoperative diagnosis: 1.  Obstructing an impacted 7 mm left UPJ stone  Procedure(s): 1.  Cystoscopy  2.  Left retrograde pyelogram with intraoperative interpretation of fluoroscopic imaging 3.  Left ureteroscopy 4.  Left holmium laser lithotripsy 5.  Left JJ stent placement  Surgeon: Ellison Hughs, MD  Assistants: None  Anesthesia: General LMA  Complications: None  EBL: Less than 5 mL  Specimens: 1.  None  Drains/Catheters: 1.  Left 6 French by 26 cm JJ stent without tether  Intraoperative findings:   1.  Solitary left collecting system with a filling defect at the left UPJ consistent with the 7 mm stone seen on her preoperative CT scan.  There was mild left renal pelvis dilation with no other filling defects seen within the renal pelvis and its associated calyces. 2.  Obstructing and impacted 7 mm left UPJ stone  Indication:  Kari Hahn is a 47 y.o. female with an obstructing left UPJ calculus and acute kidney injury.  The patient has been consented for the above procedures, voices understanding and wishes to proceed  Description of procedure:  After informed consent was obtained, the patient was brought to the operating room and general LMA anesthesia was administered. The patient was then placed in the dorsolithotomy position and prepped and draped in usual sterile fashion. A timeout was performed. A 21 French rigid cystoscope was then inserted into the urethral meatus and advanced into the bladder under direct vision. A complete bladder survey revealed no intravesical pathology.  A 5 French open-ended catheter was then used to intubate the left ureteral orifice and a left retrograde pyelogram was obtained, with the findings listed above.  A Glidewire was then advanced through the lumen of the ureteral catheter and with manipulation, was advanced beyond the level of her obstructing stone and  into the left renal pelvis, confirming placement via fluoroscopy.  A flexible ureteroscope was then advanced alongside the safety wire until her obstructing and impacted stone was identified within the proximal aspects of the left ureter.  A 200 m holmium laser was then used fracture the stone, which eventually retropulsed into the left renal pelvis.  The stone was identified in a midpole calyx and the holmium laser was then used to dust the remaining fragments of the stone.  The flexible ureteroscope was then removed under direct vision, leaving the wire in place.  A 6 [place] JJ stent was then advanced over the wire and into good position within the left collecting system, confirming placement via fluoroscopy.  The patient's bladder was then drained.  She tolerated the procedure well and was transferred to the postanesthesia unit in stable condition.  Plan: Follow-up in 1 week for office cystoscopy and stent removal

## 2017-08-21 NOTE — Interval H&P Note (Signed)
History and Physical Interval Note:  08/21/2017 7:22 AM  Kari Hahn  has presented today for surgery, with the diagnosis of LEFT URETEROPELVIC JUNCTION STONE  The various methods of treatment have been discussed with the patient and family. After consideration of risks, benefits and other options for treatment, the patient has consented to  Procedure(s): CYSTOSCOPY/RETROGRADE/URETEROSCOPY/HOLMIUM LASER/STENT PLACEMENT (Left) as a surgical intervention .  The patient's history has been reviewed, patient examined, no change in status, stable for surgery.  I have reviewed the patient's chart and labs.  Questions were answered to the patient's satisfaction.     Kari Hahn

## 2017-08-21 NOTE — Transfer of Care (Signed)
Immediate Anesthesia Transfer of Care Note  Patient: Kari Hahn  Procedure(s) Performed: CYSTOSCOPY/RETROGRADE/URETEROSCOPY/HOLMIUM LASER/STENT PLACEMENT (Left )  Patient Location: PACU  Anesthesia Type:General  Level of Consciousness: awake, alert , oriented and patient cooperative  Airway & Oxygen Therapy: Patient Spontanous Breathing and Patient connected to nasal cannula oxygen  Post-op Assessment: Report given to RN and Post -op Vital signs reviewed and stable  Post vital signs: Reviewed and stable  Last Vitals:  Vitals:   08/21/17 0549  BP: (!) 173/84  Pulse: 96  Resp: 16  Temp: 37.2 C  SpO2: 97%    Last Pain:  Vitals:   08/21/17 0549  TempSrc: Oral         Complications: No apparent anesthesia complications

## 2017-08-21 NOTE — Anesthesia Preprocedure Evaluation (Addendum)
Anesthesia Evaluation  Patient identified by MRN, date of birth, ID band Patient awake    Reviewed: Allergy & Precautions, H&P , NPO status , Patient's Chart, lab work & pertinent test results  History of Anesthesia Complications Negative for: history of anesthetic complications  Airway Mallampati: I  TM Distance: <3 FB Neck ROM: Full    Dental no notable dental hx. (+) Dental Advisory Given, Edentulous Upper, Edentulous Lower   Pulmonary Current Smoker,  Smoked cig.this am    Pulmonary exam normal breath sounds clear to auscultation       Cardiovascular hypertension, Pt. on medications Normal cardiovascular exam Rhythm:Regular Rate:Normal     Neuro/Psych Anxiety Depression negative neurological ROS     GI/Hepatic Neg liver ROS, GERD  Medicated and Controlled,  Endo/Other  negative endocrine ROSMorbid obesity  Renal/GU negative Renal ROSLeft renal stone     Musculoskeletal   Abdominal   Peds  Hematology  (+) anemia ,   Anesthesia Other Findings   Reproductive/Obstetrics                         Anesthesia Physical  Anesthesia Plan  ASA: II  Anesthesia Plan: General   Post-op Pain Management:    Induction: Intravenous  PONV Risk Score and Plan: 2 and Ondansetron and Midazolam  Airway Management Planned: LMA  Additional Equipment:   Intra-op Plan:   Post-operative Plan: Extubation in OR  Informed Consent: I have reviewed the patients History and Physical, chart, labs and discussed the procedure including the risks, benefits and alternatives for the proposed anesthesia with the patient or authorized representative who has indicated his/her understanding and acceptance.   Dental advisory given  Plan Discussed with: CRNA, Anesthesiologist and Surgeon  Anesthesia Plan Comments:         Anesthesia Quick Evaluation

## 2017-08-21 NOTE — Discharge Instructions (Signed)

## 2017-08-21 NOTE — Anesthesia Procedure Notes (Signed)
Procedure Name: LMA Insertion Date/Time: 08/21/2017 7:35 AM Performed by: Wanita Chamberlain, CRNA Pre-anesthesia Checklist: Patient identified, Timeout performed, Emergency Drugs available, Suction available and Patient being monitored Patient Re-evaluated:Patient Re-evaluated prior to induction Oxygen Delivery Method: Circle system utilized Preoxygenation: Pre-oxygenation with 100% oxygen Induction Type: IV induction LMA: LMA inserted LMA Size: 4.0 Number of attempts: 1 Placement Confirmation: CO2 detector,  positive ETCO2 and breath sounds checked- equal and bilateral Tube secured with: Tape Dental Injury: Teeth and Oropharynx as per pre-operative assessment

## 2017-08-21 NOTE — Anesthesia Postprocedure Evaluation (Signed)
Anesthesia Post Note  Patient: Kari Hahn  Procedure(s) Performed: CYSTOSCOPY/RETROGRADE/URETEROSCOPY/HOLMIUM LASER/STENT PLACEMENT (Left )     Patient location during evaluation: PACU Anesthesia Type: General Level of consciousness: awake and alert Pain management: pain level controlled Vital Signs Assessment: post-procedure vital signs reviewed and stable Respiratory status: spontaneous breathing, nonlabored ventilation, respiratory function stable and patient connected to nasal cannula oxygen Cardiovascular status: blood pressure returned to baseline and stable Postop Assessment: no apparent nausea or vomiting Anesthetic complications: no    Last Vitals:  Vitals:   08/21/17 0900 08/21/17 0945  BP:  (!) 175/90  Pulse: (!) 104 91  Resp: 19 16  Temp:  36.7 C  SpO2: 92% 95%    Last Pain:  Vitals:   08/21/17 0945  TempSrc: Oral                 Lynda Rainwater

## 2017-08-22 ENCOUNTER — Encounter (HOSPITAL_BASED_OUTPATIENT_CLINIC_OR_DEPARTMENT_OTHER): Payer: Self-pay | Admitting: Urology

## 2017-09-21 ENCOUNTER — Inpatient Hospital Stay: Payer: BLUE CROSS/BLUE SHIELD

## 2017-09-21 ENCOUNTER — Inpatient Hospital Stay: Payer: BLUE CROSS/BLUE SHIELD | Attending: Hematology & Oncology | Admitting: Family

## 2017-11-13 ENCOUNTER — Other Ambulatory Visit: Payer: Self-pay

## 2017-11-13 ENCOUNTER — Telehealth: Payer: Self-pay | Admitting: Hematology & Oncology

## 2017-11-13 ENCOUNTER — Inpatient Hospital Stay: Payer: BLUE CROSS/BLUE SHIELD | Attending: Hematology & Oncology

## 2017-11-13 ENCOUNTER — Inpatient Hospital Stay (HOSPITAL_BASED_OUTPATIENT_CLINIC_OR_DEPARTMENT_OTHER): Payer: BLUE CROSS/BLUE SHIELD | Admitting: Family

## 2017-11-13 VITALS — BP 130/64 | HR 93 | Temp 98.1°F | Wt 176.1 lb

## 2017-11-13 DIAGNOSIS — D510 Vitamin B12 deficiency anemia due to intrinsic factor deficiency: Secondary | ICD-10-CM

## 2017-11-13 DIAGNOSIS — K50918 Crohn's disease, unspecified, with other complication: Secondary | ICD-10-CM | POA: Diagnosis not present

## 2017-11-13 DIAGNOSIS — D500 Iron deficiency anemia secondary to blood loss (chronic): Secondary | ICD-10-CM

## 2017-11-13 DIAGNOSIS — I7410 Embolism and thrombosis of unspecified parts of aorta: Secondary | ICD-10-CM | POA: Diagnosis not present

## 2017-11-13 LAB — CBC WITH DIFFERENTIAL (CANCER CENTER ONLY)
Basophils Absolute: 0.1 10*3/uL (ref 0.0–0.1)
Basophils Relative: 1 %
EOS ABS: 0.2 10*3/uL (ref 0.0–0.5)
Eosinophils Relative: 1 %
HEMATOCRIT: 37.6 % (ref 34.8–46.6)
HEMOGLOBIN: 11.4 g/dL — AB (ref 11.6–15.9)
LYMPHS PCT: 15 %
Lymphs Abs: 2.3 10*3/uL (ref 0.9–3.3)
MCH: 29 pg (ref 26.0–34.0)
MCHC: 30.3 g/dL — ABNORMAL LOW (ref 32.0–36.0)
MCV: 95.7 fL (ref 81.0–101.0)
Monocytes Absolute: 1.1 10*3/uL — ABNORMAL HIGH (ref 0.1–0.9)
Monocytes Relative: 7 %
NEUTROS ABS: 11.7 10*3/uL — AB (ref 1.5–6.5)
NEUTROS PCT: 76 %
Platelet Count: 696 10*3/uL — ABNORMAL HIGH (ref 145–400)
RBC: 3.93 MIL/uL (ref 3.70–5.32)
RDW: 15.8 % — ABNORMAL HIGH (ref 11.1–15.7)
WBC: 15.4 10*3/uL — AB (ref 3.9–10.0)

## 2017-11-13 LAB — RETICULOCYTES
RBC.: 3.87 MIL/uL (ref 3.87–5.11)
Retic Count, Absolute: 108.4 10*3/uL (ref 19.0–186.0)
Retic Ct Pct: 2.8 % (ref 0.4–3.1)

## 2017-11-13 LAB — FERRITIN: FERRITIN: 12 ng/mL (ref 9–269)

## 2017-11-13 LAB — IRON AND TIBC
IRON: 17 ug/dL — AB (ref 41–142)
Saturation Ratios: 4 % — ABNORMAL LOW (ref 21–57)
TIBC: 379 ug/dL (ref 236–444)
UIBC: 362 ug/dL

## 2017-11-13 NOTE — Progress Notes (Signed)
Hematology and Oncology Follow Up Visit  LAURABETH YIP [id number] 29-May-1971 47 y.o. 11/13/2017   Principle Diagnosis:  Idiopathic aortic thrombosis Pernicious anemia Iron deficiency anemia Crohn's disease  Current Therapy:   Vitamin B12 PO BID daily IV iron as needed - last received in January2019   Interim History:  Ms. Tiano is here today for follow-up. She is symptomatic with fatigued, weakness and palpitations.  She states that she had cystoscopy and laser removal of left UPJ stone with hydronephrosis.  She still has some lower right abdominal pain she feels is due to a hernia. Her cycles is irregular and light. She has persistent flares with her Crohn's disease and is discussing Humira with her PCP.  No fever, chills, n/v, cough, rash, dizziness, SOB, chest pain or changes in bowel or bladder habits. She is still smoking.  No lymphadenopathy noted on exam.   She has puffiness in her feet and ankles that comes and goes. 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  She has a good appetite and is staying well hydrated. Her weight is stable.    ECOG Performance Status: 1 - Symptomatic but completely ambulatory  Medications:  Allergies as of 11/13/2017      Reactions   Eggs Or Egg-derived Products Nausea And Vomiting   Ibuprofen Other (See Comments)   stomache upset      Medication List        Accurate as of 11/13/17  9:18 AM. Always use your most recent med list.          baclofen 10 MG tablet Commonly known as:  LIORESAL Take 10 mg by mouth as needed.   BIOTIN 5000 PO Take by mouth daily.   EXFORGE 5-160 MG tablet Generic drug:  amLODipine-valsartan 1 TABLET BY MOUTH DAILY FOR BLOOD PRESSURE--- takes in am after eaten   fluticasone 50 MCG/ACT nasal spray Commonly known as:  FLONASE daily as needed.   folic acid 1 MG tablet Commonly known as:  FOLVITE Take 1 mg by mouth daily.   gabapentin 800 MG tablet Commonly known as:  NEURONTIN Take  800 mg by mouth 3 (three) times daily.   LEXAPRO 20 MG tablet Generic drug:  escitalopram Take 40 mg by mouth at bedtime. 2  20 mg tab = 40 mg daily   omeprazole 40 MG capsule Commonly known as:  PRILOSEC Take 40 mg by mouth every morning.   oxyCODONE-acetaminophen 10-325 MG tablet Commonly known as:  PERCOCET Take 1 tablet by mouth 4 (four) times daily.   potassium chloride SA 20 MEQ tablet Commonly known as:  K-DUR,KLOR-CON Take 20 mEq by mouth every evening.   promethazine 25 MG tablet Commonly known as:  PHENERGAN Take 25 mg by mouth every 6 (six) hours as needed.   sulfaSALAzine 500 MG EC tablet Commonly known as:  AZULFIDINE Take 1,000 mg by mouth 4 (four) times daily.   VITAMIN B 12 PO Take by mouth 2 (two) times daily.   vitamin C 1000 MG tablet Take 1,000 mg by mouth daily.   Vitamin D3 5000 units Tabs Take by mouth every morning.   zinc gluconate 50 MG tablet Take 50 mg by mouth daily.       Allergies:  Allergies  Allergen Reactions  . Eggs Or Egg-Derived Products Nausea And Vomiting  . Ibuprofen Other (See Comments)    stomache upset    Past Medical History, Surgical history, Social history, and Family History were reviewed and updated.  Review  of Systems: All other 10 point review of systems is negative.   Physical Exam:  weight is 176 lb 1.9 oz (79.9 kg). Her oral temperature is 98.1 F (36.7 C). Her blood pressure is 130/64 and her pulse is 93. Her oxygen saturation is 99%.   Wt Readings from Last 3 Encounters:  11/13/17 176 lb 1.9 oz (79.9 kg)  08/21/17 172 lb (78 kg)  08/09/17 170 lb 12 oz (77.5 kg)    Ocular: Sclerae unicteric, pupils equal, round and reactive to light Ear-nose-throat: Oropharynx clear, dentition fair Lymphatic: No cervical, supraclavicular or axillary adenopathy Lungs no rales or rhonchi, good excursion bilaterally Heart regular rate and rhythm, no murmur appreciated Abd soft, nontender, positive bowel sounds, no  liver or spleen tip palpated on exam, no fluid wave  MSK no focal spinal tenderness, no joint edema Neuro: non-focal, well-oriented, appropriate affect Breasts: Deferred   Lab Results  Component Value Date   WBC 15.4 (H) 11/13/2017   HGB 11.4 (L) 11/13/2017   HCT 37.6 11/13/2017   MCV 95.7 11/13/2017   PLT 696 (H) 11/13/2017   Lab Results  Component Value Date   FERRITIN 56 08/09/2017   IRON 50 08/09/2017   TIBC 300 08/09/2017   UIBC 250 08/09/2017   IRONPCTSAT 17 (L) 08/09/2017   Lab Results  Component Value Date   RETICCTPCT 2.5 (H) 08/09/2017   RBC 3.93 11/13/2017   RETICCTABS 88.8 12/21/2014   No results found for: KPAFRELGTCHN, LAMBDASER, KAPLAMBRATIO No results found for: IGGSERUM, IGA, IGMSERUM No results found for: Odetta Pink, SPEI   Chemistry      Component Value Date/Time   NA 141 08/21/2017 0733   NA 141 03/12/2017 0929   K 3.2 (L) 08/21/2017 0733   K 2.8 (LL) 03/12/2017 0929   CL 101 06/18/2013 0030   CL 106 12/08/2011 1145   CO2 27 03/12/2017 0929   BUN 9.4 03/12/2017 0929   CREATININE 1.2 (H) 03/12/2017 0929      Component Value Date/Time   CALCIUM 9.5 03/12/2017 0929   ALKPHOS 125 03/12/2017 0929   AST 13 03/12/2017 0929   ALT 9 03/12/2017 0929   BILITOT <0.22 03/12/2017 0929      Impression and Plan: Ms. Weimann is a very pleasant 47 yo caucasian female with history of idiopathic aortic thrombosis and iron deficiency anemia secondary to chronic GI blood loss with Crohn's disease. She is symptomatic with fatigued, weakness and palpitations.  Her iron saturation is 4%, ferritin 12. She will get IV iron this week and a second dose next week. We will se her back for follow-up in 6 weeks.  She will contact our office with any questions or concerns. We can certainly see her sooner if need be.   [name], NP 5/21/20199:18 AM

## 2017-11-13 NOTE — Telephone Encounter (Signed)
NO LOS FOR VISIT 11/13/17

## 2017-11-15 ENCOUNTER — Inpatient Hospital Stay: Payer: BLUE CROSS/BLUE SHIELD

## 2017-11-15 ENCOUNTER — Other Ambulatory Visit: Payer: Self-pay

## 2017-11-15 VITALS — BP 133/69 | HR 88

## 2017-11-15 DIAGNOSIS — D500 Iron deficiency anemia secondary to blood loss (chronic): Secondary | ICD-10-CM

## 2017-11-15 DIAGNOSIS — D509 Iron deficiency anemia, unspecified: Secondary | ICD-10-CM

## 2017-11-15 MED ORDER — ONDANSETRON HCL 8 MG PO TABS
ORAL_TABLET | ORAL | Status: AC
  Filled 2017-11-15: qty 1

## 2017-11-15 MED ORDER — FERUMOXYTOL INJECTION 510 MG/17 ML
510.0000 mg | Freq: Once | INTRAVENOUS | Status: AC
  Administered 2017-11-15: 510 mg via INTRAVENOUS
  Filled 2017-11-15: qty 17

## 2017-11-15 MED ORDER — ONDANSETRON HCL 8 MG PO TABS
8.0000 mg | ORAL_TABLET | Freq: Once | ORAL | Status: AC
  Administered 2017-11-15: 8 mg via ORAL

## 2017-11-15 NOTE — Progress Notes (Signed)
Patient complains of nausea. [name], NP notified. Order given and carried out for Zofran 8 mg PO. Patient verbalized understanding.   Patient states nausea is completely resolved.   Patient refuses to stay for 30 minute post Iron infusion observation. Patient VSS. Patient discharged ambulatory with son without complaints or concerns.

## 2017-11-15 NOTE — Patient Instructions (Signed)

## 2017-11-20 ENCOUNTER — Other Ambulatory Visit: Payer: Self-pay | Admitting: *Deleted

## 2017-11-20 DIAGNOSIS — K50919 Crohn's disease, unspecified, with unspecified complications: Secondary | ICD-10-CM

## 2017-11-20 MED ORDER — ONDANSETRON HCL 8 MG PO TABS: 8.0000 mg | ORAL_TABLET | Freq: Two times a day (BID) | ORAL | 3 refills | Status: DC

## 2017-11-22 ENCOUNTER — Other Ambulatory Visit: Payer: Self-pay | Admitting: Family

## 2017-11-22 ENCOUNTER — Ambulatory Visit (HOSPITAL_BASED_OUTPATIENT_CLINIC_OR_DEPARTMENT_OTHER)
Admission: RE | Admit: 2017-11-22 | Discharge: 2017-11-22 | Disposition: A | Discharge status: Home / Self Care | Payer: BLUE CROSS/BLUE SHIELD | Source: Ambulatory Visit | Attending: Family | Admitting: Family

## 2017-11-22 ENCOUNTER — Inpatient Hospital Stay: Payer: BLUE CROSS/BLUE SHIELD

## 2017-11-22 VITALS — BP 132/68 | HR 81 | Temp 98.2°F | Resp 20

## 2017-11-22 DIAGNOSIS — I82811 Embolism and thrombosis of superficial veins of right lower extremities: Secondary | ICD-10-CM | POA: Insufficient documentation

## 2017-11-22 DIAGNOSIS — I82890 Acute embolism and thrombosis of other specified veins: Secondary | ICD-10-CM | POA: Diagnosis not present

## 2017-11-22 DIAGNOSIS — M79604 Pain in right leg: Secondary | ICD-10-CM | POA: Diagnosis present

## 2017-11-22 DIAGNOSIS — M7989 Other specified soft tissue disorders: Secondary | ICD-10-CM | POA: Diagnosis present

## 2017-11-22 DIAGNOSIS — D509 Iron deficiency anemia, unspecified: Secondary | ICD-10-CM

## 2017-11-22 DIAGNOSIS — D500 Iron deficiency anemia secondary to blood loss (chronic): Secondary | ICD-10-CM

## 2017-11-22 MED ORDER — ONDANSETRON HCL 8 MG PO TABS
8.0000 mg | ORAL_TABLET | Freq: Once | ORAL | Status: AC
  Administered 2017-11-22: 8 mg via ORAL

## 2017-11-22 MED ORDER — SODIUM CHLORIDE 0.9 % IV SOLN
INTRAVENOUS | Status: DC
  Administered 2017-11-22: 13:00:00 via INTRAVENOUS

## 2017-11-22 MED ORDER — ONDANSETRON HCL 8 MG PO TABS
ORAL_TABLET | ORAL | Status: AC
  Filled 2017-11-22: qty 1

## 2017-11-22 MED ORDER — SODIUM CHLORIDE 0.9 % IV SOLN
510.0000 mg | Freq: Once | INTRAVENOUS | Status: AC
  Administered 2017-11-22: 510 mg via INTRAVENOUS
  Filled 2017-11-22: qty 17

## 2017-11-22 NOTE — Progress Notes (Signed)
Pt states that she has new swelling to right calf with pain to the back of right calf that started over the weekend.  Jory Ee NP notified and order placed for [place] of right leg for pt.  Pt sent to scheduling at time of discharge to schedule appt for [place].  Patient has no questions at time discharge.

## 2017-11-22 NOTE — Progress Notes (Signed)
11/22/17 - 1300  Patient complaint of nausea with first dose of Feraheme, resolved with Zofran 8 mg tablet.  Order received to give Zofran 8 mg oral tablet with each dose of Feraheme.  V.O. [facility] NP/Astaria Nanez Ronnald Ramp, PharmD

## 2017-11-23 ENCOUNTER — Other Ambulatory Visit: Payer: Self-pay | Admitting: *Deleted

## 2017-11-23 ENCOUNTER — Telehealth: Payer: Self-pay | Admitting: Family

## 2017-11-23 ENCOUNTER — Other Ambulatory Visit: Payer: Self-pay | Admitting: Family

## 2017-11-23 DIAGNOSIS — I82431 Acute embolism and thrombosis of right popliteal vein: Secondary | ICD-10-CM

## 2017-11-23 MED ORDER — RIVAROXABAN (XARELTO) VTE STARTER PACK (15 & 20 MG): ORAL_TABLET | ORAL | 0 refills | Status: DC

## 2017-11-23 NOTE — Telephone Encounter (Signed)
Went over her [place] results with Ms. Fluty, she verbalized understanding. Xarelto prescription sent to [facility]. No other questions at this time.

## 2017-11-23 NOTE — Telephone Encounter (Signed)
Have tried calling both her cell and home phone twice. No answer and no voice mail set up to leave a message. [place] positive for DVT of the right lower extremity and need to get patient on Xarelto. Will continue to try and contact.

## 2017-11-29 ENCOUNTER — Ambulatory Visit: Payer: BLUE CROSS/BLUE SHIELD

## 2017-12-19 ENCOUNTER — Other Ambulatory Visit: Payer: Self-pay | Admitting: *Deleted

## 2017-12-19 DIAGNOSIS — I749 Embolism and thrombosis of unspecified artery: Secondary | ICD-10-CM

## 2017-12-19 MED ORDER — RIVAROXABAN 20 MG PO TABS: 20.0000 mg | ORAL_TABLET | Freq: Every day | ORAL | 11 refills | Status: DC

## 2018-01-01 ENCOUNTER — Inpatient Hospital Stay: Payer: BLUE CROSS/BLUE SHIELD | Admitting: Family

## 2018-01-01 ENCOUNTER — Inpatient Hospital Stay: Payer: BLUE CROSS/BLUE SHIELD | Attending: Hematology & Oncology

## 2018-01-01 DIAGNOSIS — Z7901 Long term (current) use of anticoagulants: Secondary | ICD-10-CM | POA: Insufficient documentation

## 2018-01-01 DIAGNOSIS — F17210 Nicotine dependence, cigarettes, uncomplicated: Secondary | ICD-10-CM | POA: Insufficient documentation

## 2018-01-01 DIAGNOSIS — Z86718 Personal history of other venous thrombosis and embolism: Secondary | ICD-10-CM | POA: Insufficient documentation

## 2018-01-01 DIAGNOSIS — D500 Iron deficiency anemia secondary to blood loss (chronic): Secondary | ICD-10-CM | POA: Insufficient documentation

## 2018-01-01 DIAGNOSIS — K50911 Crohn's disease, unspecified, with rectal bleeding: Secondary | ICD-10-CM | POA: Insufficient documentation

## 2018-01-01 DIAGNOSIS — Z79899 Other long term (current) drug therapy: Secondary | ICD-10-CM | POA: Insufficient documentation

## 2018-01-01 DIAGNOSIS — I824Z1 Acute embolism and thrombosis of unspecified deep veins of right distal lower extremity: Secondary | ICD-10-CM | POA: Insufficient documentation

## 2018-01-08 ENCOUNTER — Encounter: Payer: Self-pay | Admitting: Family

## 2018-01-08 ENCOUNTER — Inpatient Hospital Stay (HOSPITAL_BASED_OUTPATIENT_CLINIC_OR_DEPARTMENT_OTHER): Payer: BLUE CROSS/BLUE SHIELD | Admitting: Family

## 2018-01-08 ENCOUNTER — Other Ambulatory Visit: Payer: Self-pay

## 2018-01-08 ENCOUNTER — Inpatient Hospital Stay: Payer: BLUE CROSS/BLUE SHIELD

## 2018-01-08 DIAGNOSIS — I824Z1 Acute embolism and thrombosis of unspecified deep veins of right distal lower extremity: Secondary | ICD-10-CM | POA: Diagnosis not present

## 2018-01-08 DIAGNOSIS — D500 Iron deficiency anemia secondary to blood loss (chronic): Secondary | ICD-10-CM | POA: Diagnosis not present

## 2018-01-08 DIAGNOSIS — Z79899 Other long term (current) drug therapy: Secondary | ICD-10-CM | POA: Diagnosis not present

## 2018-01-08 DIAGNOSIS — F17210 Nicotine dependence, cigarettes, uncomplicated: Secondary | ICD-10-CM

## 2018-01-08 DIAGNOSIS — I82431 Acute embolism and thrombosis of right popliteal vein: Secondary | ICD-10-CM

## 2018-01-08 DIAGNOSIS — K50911 Crohn's disease, unspecified, with rectal bleeding: Secondary | ICD-10-CM

## 2018-01-08 DIAGNOSIS — Z86718 Personal history of other venous thrombosis and embolism: Secondary | ICD-10-CM

## 2018-01-08 DIAGNOSIS — Z7901 Long term (current) use of anticoagulants: Secondary | ICD-10-CM | POA: Diagnosis not present

## 2018-01-08 DIAGNOSIS — I7410 Embolism and thrombosis of unspecified parts of aorta: Secondary | ICD-10-CM

## 2018-01-08 LAB — RETICULOCYTES
RBC.: 3.93 MIL/uL (ref 3.70–5.45)
Retic Count, Absolute: 55 10*3/uL (ref 33.7–90.7)
Retic Ct Pct: 1.4 % (ref 0.7–2.1)

## 2018-01-08 LAB — CBC WITH DIFFERENTIAL (CANCER CENTER ONLY)
Basophils Absolute: 0.1 10*3/uL (ref 0.0–0.1)
Basophils Relative: 1 %
Eosinophils Absolute: 0.4 10*3/uL (ref 0.0–0.5)
Eosinophils Relative: 3 %
HEMATOCRIT: 37.3 % (ref 34.8–46.6)
Hemoglobin: 11.5 g/dL — ABNORMAL LOW (ref 11.6–15.9)
LYMPHS ABS: 2.8 10*3/uL (ref 0.9–3.3)
LYMPHS PCT: 25 %
MCH: 29.2 pg (ref 26.0–34.0)
MCHC: 30.8 g/dL — ABNORMAL LOW (ref 32.0–36.0)
MCV: 94.7 fL (ref 81.0–101.0)
MONO ABS: 1.2 10*3/uL — AB (ref 0.1–0.9)
Monocytes Relative: 11 %
NEUTROS ABS: 7 10*3/uL — AB (ref 1.5–6.5)
Neutrophils Relative %: 60 %
Platelet Count: 463 10*3/uL — ABNORMAL HIGH (ref 145–400)
RBC: 3.94 MIL/uL (ref 3.70–5.32)
RDW: 19 % — ABNORMAL HIGH (ref 11.1–15.7)
WBC: 11.5 10*3/uL — AB (ref 3.9–10.0)

## 2018-01-08 NOTE — Progress Notes (Signed)
Hematology and Oncology Follow Up Visit  Kari Hahn [id number] March 15, 1971 47 y.o. 01/08/2018   Principle Diagnosis:  Right lower extremity DVT Idiopathic aortic thrombosis Pernicious anemia Iron deficiency anemia Crohn's disease  Current Therapy:   Xarelto 20 mg PO daily  Vitamin B12 PO BID daily IV iron as needed - last received in January2019   Interim History: Kari Hahn is here today for follow-up. She is symptomatic with "extreme" fatigue.  She was diagnosed with a DVT in May and is doing well on Xarelto 20 mg PO daily. She still has some mild swelling in her right lower extremity when on her feet. Pedal pulses +3.   She states that her cycle last month was very heavy and that she has not had a cycle yet this month. No other episodes of bleeding, no bruising or petechiae.  She is still smoking.  She is now on Humira for Crohn's disease and tolerating it nicely.  No fever, chills, n/v, cough, rash, dizziness, SOB, chest pain, palpitations, abdominal pain or changes in bowel or bladder habits.  No numbness or tingling in her extremities. No lymphadenopathy noted on exam.  She has maintained a good appetite and is staying well hydrated. Her weight is stable.   ECOG Performance Status: 1 - Symptomatic but completely ambulatory  Medications:  Allergies as of 01/08/2018      Reactions   Eggs Or Egg-derived Products Nausea And Vomiting   Ibuprofen Other (See Comments)   stomache upset      Medication List        Accurate as of 01/08/18  2:32 PM. Always use your most recent med list.          baclofen 10 MG tablet Commonly known as:  LIORESAL Take 10 mg by mouth as needed.   BIOTIN 5000 PO Take by mouth daily.   EXFORGE 5-160 MG tablet Generic drug:  amLODipine-valsartan 1 TABLET BY MOUTH DAILY FOR BLOOD PRESSURE--- takes in am after eaten   fluticasone 50 MCG/ACT nasal spray Commonly known as:  FLONASE daily as needed.   folic acid 1 MG tablet Commonly  known as:  FOLVITE Take 1 mg by mouth daily.   gabapentin 800 MG tablet Commonly known as:  NEURONTIN Take 800 mg by mouth 3 (three) times daily.   HUMIRA PEN [place] Inject 80 mg into the skin every 14 (fourteen) days.   LEXAPRO 20 MG tablet Generic drug:  escitalopram Take 40 mg by mouth at bedtime. 2  20 mg tab = 40 mg daily   ondansetron 8 MG tablet Commonly known as:  ZOFRAN Take 1 tablet (8 mg total) by mouth 2 (two) times daily.   oxyCODONE-acetaminophen 10-325 MG tablet Commonly known as:  PERCOCET Take 1 tablet by mouth 4 (four) times daily.   potassium chloride SA 20 MEQ tablet Commonly known as:  K-DUR,KLOR-CON Take 20 mEq by mouth every evening.   promethazine 25 MG tablet Commonly known as:  PHENERGAN Take 25 mg by mouth every 6 (six) hours as needed.   rivaroxaban 20 MG Tabs tablet Commonly known as:  XARELTO Take 1 tablet (20 mg total) by mouth daily with supper.   VITAMIN B 12 PO Take by mouth 2 (two) times daily.   vitamin C 1000 MG tablet Take 1,000 mg by mouth daily.   Vitamin D3 5000 units Tabs Take by mouth every morning.   zinc gluconate 50 MG tablet Take 50 mg by mouth daily.  Allergies:  Allergies  Allergen Reactions  . Eggs Or Egg-Derived Products Nausea And Vomiting  . Ibuprofen Other (See Comments)    stomache upset    Past Medical History, Surgical history, Social history, and Family History were reviewed and updated.  Review of Systems: All other 10 point review of systems is negative.   Physical Exam:  vitals were not taken for this visit.   Wt Readings from Last 3 Encounters:  11/13/17 176 lb 1.9 oz (79.9 kg)  08/21/17 172 lb (78 kg)  08/09/17 170 lb 12 oz (77.5 kg)    Ocular: Sclerae unicteric, pupils equal, round and reactive to light Ear-nose-throat: Oropharynx clear, dentition fair Lymphatic: No cervical, supraclavicular or axillary adenopathy Lungs no rales or rhonchi, good excursion bilaterally Heart  regular rate and rhythm, no murmur appreciated Abd soft, nontender, positive bowel sounds, no liver or spleen tip palpated on exam, no fluid wave  MSK no focal spinal tenderness, no joint edema Neuro: non-focal, well-oriented, appropriate affect Breasts: Deferred   Lab Results  Component Value Date   WBC 11.5 (H) 01/08/2018   HGB 11.5 (L) 01/08/2018   HCT 37.3 01/08/2018   MCV 94.7 01/08/2018   PLT 463 (H) 01/08/2018   Lab Results  Component Value Date   FERRITIN 12 11/13/2017   IRON 17 (L) 11/13/2017   TIBC 379 11/13/2017   UIBC 362 11/13/2017   IRONPCTSAT 4 (L) 11/13/2017   Lab Results  Component Value Date   RETICCTPCT 2.8 11/13/2017   RBC 3.94 01/08/2018   RETICCTABS 88.8 12/21/2014   No results found for: KPAFRELGTCHN, LAMBDASER, KAPLAMBRATIO No results found for: IGGSERUM, IGA, IGMSERUM No results found for: Odetta Pink, SPEI   Chemistry      Component Value Date/Time   NA 141 08/21/2017 0733   NA 141 03/12/2017 0929   K 3.2 (L) 08/21/2017 0733   K 2.8 (LL) 03/12/2017 0929   CL 101 06/18/2013 0030   CL 106 12/08/2011 1145   CO2 27 03/12/2017 0929   BUN 9.4 03/12/2017 0929   CREATININE 1.2 (H) 03/12/2017 0929      Component Value Date/Time   CALCIUM 9.5 03/12/2017 0929   ALKPHOS 125 03/12/2017 0929   AST 13 03/12/2017 0929   ALT 9 03/12/2017 0929   BILITOT <0.22 03/12/2017 0929      Impression and Plan: Kari Hahn is a very pleasant 47 yo caucasian female with history of idiopathic aortic thrombosis, now a DVT in her right lower extremity and iron deficiency anemia secondary to chronic GI blood loss with Crohn's disease. She is symptomatic as mentioned above.  She is doing well on Xarelto and will continue her same regimen.  We will see what her iron studies show and bring her back in for infusion if needed.  We will plan to see her back in another 6 weeks for follow-up.  She will contact our office  with any questions or concerns. We can certainly see her sooner if need be.   Kari Peace, NP 7/16/20192:32 PM

## 2018-01-09 LAB — FERRITIN: Ferritin: 22 ng/mL (ref 11–307)

## 2018-01-09 LAB — IRON AND TIBC
Iron: 21 ug/dL — ABNORMAL LOW (ref 41–142)
SATURATION RATIOS: 6 % — AB (ref 21–57)
TIBC: 334 ug/dL (ref 236–444)
UIBC: 313 ug/dL

## 2018-01-15 ENCOUNTER — Inpatient Hospital Stay: Payer: BLUE CROSS/BLUE SHIELD

## 2018-01-15 ENCOUNTER — Ambulatory Visit: Payer: BLUE CROSS/BLUE SHIELD | Admitting: Family

## 2018-01-15 VITALS — BP 146/77 | HR 88 | Temp 98.6°F | Resp 20

## 2018-01-15 DIAGNOSIS — D509 Iron deficiency anemia, unspecified: Secondary | ICD-10-CM

## 2018-01-15 DIAGNOSIS — D500 Iron deficiency anemia secondary to blood loss (chronic): Secondary | ICD-10-CM

## 2018-01-15 MED ORDER — SODIUM CHLORIDE 0.9 % IV SOLN
510.0000 mg | Freq: Once | INTRAVENOUS | Status: AC
  Administered 2018-01-15: 510 mg via INTRAVENOUS
  Filled 2018-01-15: qty 17

## 2018-01-15 MED ORDER — ONDANSETRON HCL 8 MG PO TABS
8.0000 mg | ORAL_TABLET | Freq: Once | ORAL | Status: AC
Start: 2018-01-15 — End: 2018-01-15
  Administered 2018-01-15: 8 mg via ORAL

## 2018-01-15 MED ORDER — ONDANSETRON HCL 8 MG PO TABS
ORAL_TABLET | ORAL | Status: AC
  Filled 2018-01-15: qty 1

## 2018-01-15 MED ORDER — SODIUM CHLORIDE 0.9 % IV SOLN
INTRAVENOUS | Status: DC
  Administered 2018-01-15: 09:00:00 via INTRAVENOUS

## 2018-01-15 NOTE — Patient Instructions (Signed)

## 2018-01-15 NOTE — Progress Notes (Signed)
Pt refuses to stay for 30 minute post observation after Feraheme.  Pt without complaints at time of discharge.

## 2018-01-17 ENCOUNTER — Other Ambulatory Visit: Payer: BLUE CROSS/BLUE SHIELD

## 2018-01-22 ENCOUNTER — Other Ambulatory Visit: Payer: Self-pay

## 2018-01-22 ENCOUNTER — Inpatient Hospital Stay: Payer: BLUE CROSS/BLUE SHIELD

## 2018-01-22 VITALS — BP 102/51 | HR 76 | Temp 98.8°F

## 2018-01-22 DIAGNOSIS — D500 Iron deficiency anemia secondary to blood loss (chronic): Secondary | ICD-10-CM | POA: Diagnosis not present

## 2018-01-22 DIAGNOSIS — D509 Iron deficiency anemia, unspecified: Secondary | ICD-10-CM

## 2018-01-22 MED ORDER — ONDANSETRON HCL 8 MG PO TABS
8.0000 mg | ORAL_TABLET | Freq: Once | ORAL | Status: AC
  Administered 2018-01-22: 8 mg via ORAL

## 2018-01-22 MED ORDER — SODIUM CHLORIDE 0.9 % IV SOLN
Freq: Once | INTRAVENOUS | Status: AC
  Administered 2018-01-22: 09:00:00 via INTRAVENOUS
  Filled 2018-01-22: qty 250

## 2018-01-22 MED ORDER — SODIUM CHLORIDE 0.9 % IV SOLN
510.0000 mg | Freq: Once | INTRAVENOUS | Status: AC
  Administered 2018-01-22: 510 mg via INTRAVENOUS
  Filled 2018-01-22: qty 17

## 2018-01-22 MED ORDER — ONDANSETRON HCL 8 MG PO TABS
ORAL_TABLET | ORAL | Status: AC
  Filled 2018-01-22: qty 1

## 2018-01-22 NOTE — Progress Notes (Signed)
Patient refused to wait the full 30 min post infusion. Releasd stable and ASX.

## 2018-01-22 NOTE — Patient Instructions (Signed)

## 2018-02-19 ENCOUNTER — Inpatient Hospital Stay: Payer: BLUE CROSS/BLUE SHIELD | Attending: Hematology & Oncology

## 2018-02-19 ENCOUNTER — Other Ambulatory Visit: Payer: Self-pay

## 2018-02-19 ENCOUNTER — Inpatient Hospital Stay (HOSPITAL_BASED_OUTPATIENT_CLINIC_OR_DEPARTMENT_OTHER): Payer: BLUE CROSS/BLUE SHIELD | Admitting: Family

## 2018-02-19 VITALS — BP 135/73 | HR 89 | Temp 98.4°F | Resp 20 | Wt 165.8 lb

## 2018-02-19 DIAGNOSIS — D500 Iron deficiency anemia secondary to blood loss (chronic): Secondary | ICD-10-CM

## 2018-02-19 DIAGNOSIS — I82431 Acute embolism and thrombosis of right popliteal vein: Secondary | ICD-10-CM | POA: Insufficient documentation

## 2018-02-19 DIAGNOSIS — N920 Excessive and frequent menstruation with regular cycle: Secondary | ICD-10-CM | POA: Insufficient documentation

## 2018-02-19 DIAGNOSIS — K50911 Crohn's disease, unspecified, with rectal bleeding: Secondary | ICD-10-CM | POA: Diagnosis present

## 2018-02-19 DIAGNOSIS — I824Z1 Acute embolism and thrombosis of unspecified deep veins of right distal lower extremity: Secondary | ICD-10-CM | POA: Insufficient documentation

## 2018-02-19 DIAGNOSIS — I7410 Embolism and thrombosis of unspecified parts of aorta: Secondary | ICD-10-CM

## 2018-02-19 DIAGNOSIS — Z7901 Long term (current) use of anticoagulants: Secondary | ICD-10-CM | POA: Insufficient documentation

## 2018-02-19 LAB — CBC WITH DIFFERENTIAL/PLATELET
Basophils Absolute: 0.1 10*3/uL (ref 0.0–0.1)
Basophils Relative: 1 %
EOS PCT: 1 %
Eosinophils Absolute: 0.2 10*3/uL (ref 0.0–0.5)
HCT: 49.4 % — ABNORMAL HIGH (ref 34.8–46.6)
Hemoglobin: 15.9 g/dL (ref 11.6–15.9)
LYMPHS ABS: 2 10*3/uL (ref 0.9–3.3)
LYMPHS PCT: 14 %
MCH: 31.1 pg (ref 25.1–34.0)
MCHC: 32.2 g/dL (ref 31.5–36.0)
MCV: 96.5 fL (ref 79.5–101.0)
MONO ABS: 0.9 10*3/uL (ref 0.1–0.9)
Monocytes Relative: 6 %
Neutro Abs: 11.4 10*3/uL — ABNORMAL HIGH (ref 1.5–6.5)
Neutrophils Relative %: 78 %
PLATELETS: 397 10*3/uL (ref 145–400)
RBC: 5.12 MIL/uL (ref 3.70–5.45)
RDW: 20.2 % — ABNORMAL HIGH (ref 11.2–14.5)
WBC: 14.5 10*3/uL — AB (ref 3.9–10.3)

## 2018-02-19 LAB — RETICULOCYTES
RBC.: 5.12 MIL/uL (ref 3.70–5.45)
Retic Count, Absolute: 66.6 10*3/uL (ref 33.7–90.7)
Retic Ct Pct: 1.3 % (ref 0.7–2.1)

## 2018-02-19 NOTE — Progress Notes (Signed)
Hematology and Oncology Follow Up Visit  Kari Hahn [id number] August 12, 1970 47 y.o. 02/19/2018   Principle Diagnosis:  Right lower extremity DVT Idiopathic aortic thrombosis Pernicious anemia Iron deficiency anemia Crohn's disease  Current Therapy:   Xarelto 20 mg PO daily  Vitamin B12 PO BID daily IV iron as needed - last received in July2019   Interim History: Kari Hahn is here today with her son for follow-up. She received IV iron last month and states that she does feel a little better. She still feels fatigued at times and that her iron may still be low.  Her cycle is irregular and has been quite heavy.  No other bleeding, no bruising or petechiae.  She has had occasional palpitations.  No fever, chills, n/v, cough, rash, dizziness, SOB, chest pain, abdominal pain or changes in bowel or bladder habits.  No swelling, tenderness, numbness or tingling in her extremities. No lymphadenopathy noted on exam.  She has a good appetite and is staying well hydrated. Her weight is stable.   ECOG Performance Status: 1 - Symptomatic but completely ambulatory  Medications:  Allergies as of 02/19/2018      Reactions   Eggs Or Egg-derived Products Nausea And Vomiting   Ibuprofen Other (See Comments)   stomache upset      Medication List        Accurate as of 02/19/18  1:57 PM. Always use your most recent med list.          baclofen 10 MG tablet Commonly known as:  LIORESAL Take 10 mg by mouth as needed.   EXFORGE 5-160 MG tablet Generic drug:  amLODipine-valsartan 1 TABLET BY MOUTH DAILY FOR BLOOD PRESSURE--- takes in am after eaten   fluticasone 50 MCG/ACT nasal spray Commonly known as:  FLONASE daily as needed.   folic acid 1 MG tablet Commonly known as:  FOLVITE Take 1 mg by mouth daily.   gabapentin 800 MG tablet Commonly known as:  NEURONTIN Take 800 mg by mouth 3 (three) times daily.   HUMIRA PEN 40 MG/0.4ML Pnkt Generic drug:  Adalimumab   LEXAPRO 20  MG tablet Generic drug:  escitalopram Take 40 mg by mouth at bedtime. 2  20 mg tab = 40 mg daily   ondansetron 8 MG tablet Commonly known as:  ZOFRAN Take 1 tablet (8 mg total) by mouth 2 (two) times daily.   oxyCODONE-acetaminophen 10-325 MG tablet Commonly known as:  PERCOCET Take 1 tablet by mouth 4 (four) times daily.   phenazopyridine 100 MG tablet Commonly known as:  PYRIDIUM Take 100 mg by mouth daily.   potassium chloride SA 20 MEQ tablet Commonly known as:  K-DUR,KLOR-CON Take 20 mEq by mouth every evening.   promethazine 25 MG tablet Commonly known as:  PHENERGAN Take 25 mg by mouth every 6 (six) hours as needed.   rivaroxaban 20 MG Tabs tablet Commonly known as:  XARELTO Take 1 tablet (20 mg total) by mouth daily with supper.   VITAMIN B 12 PO Take by mouth 2 (two) times daily.   vitamin C 1000 MG tablet Take 1,000 mg by mouth daily.   Vitamin D3 5000 units Tabs Take by mouth every morning.   zinc gluconate 50 MG tablet Take 50 mg by mouth daily.       Allergies:  Allergies  Allergen Reactions  . Eggs Or Egg-Derived Products Nausea And Vomiting  . Ibuprofen Other (See Comments)    stomache upset    Past Medical History,  Surgical history, Social history, and Family History were reviewed and updated.  Review of Systems: All other 10 point review of systems is negative.   Physical Exam:  weight is 165 lb 12 oz (75.2 kg). Her oral temperature is 98.4 F (36.9 C). Her blood pressure is 135/73 and her pulse is 89. Her respiration is 20 and oxygen saturation is 94%.   Wt Readings from Last 3 Encounters:  02/19/18 165 lb 12 oz (75.2 kg)  11/13/17 176 lb 1.9 oz (79.9 kg)  08/21/17 172 lb (78 kg)    Ocular: Sclerae unicteric, pupils equal, round and reactive to light Ear-nose-throat: Oropharynx clear, dentition fair Lymphatic: No cervical, supraclavicular or axillary adenopathy Lungs no rales or rhonchi, good excursion bilaterally Heart regular  rate and rhythm, no murmur appreciated Abd soft, nontender, positive bowel sounds, no liver or spleen tip palpated on exam, no fluid wave  MSK no focal spinal tenderness, no joint edema Neuro: non-focal, well-oriented, appropriate affect Breasts: Deferred   Lab Results  Component Value Date   WBC 11.5 (H) 01/08/2018   HGB 11.5 (L) 01/08/2018   HCT 37.3 01/08/2018   MCV 94.7 01/08/2018   PLT 463 (H) 01/08/2018   Lab Results  Component Value Date   FERRITIN 22 01/08/2018   IRON 21 (L) 01/08/2018   TIBC 334 01/08/2018   UIBC 313 01/08/2018   IRONPCTSAT 6 (L) 01/08/2018   Lab Results  Component Value Date   RETICCTPCT 1.4 01/08/2018   RBC 3.94 01/08/2018   RBC 3.93 01/08/2018   RETICCTABS 88.8 12/21/2014   No results found for: KPAFRELGTCHN, LAMBDASER, KAPLAMBRATIO No results found for: IGGSERUM, IGA, IGMSERUM No results found for: Odetta Pink, SPEI   Chemistry      Component Value Date/Time   NA 141 08/21/2017 0733   NA 141 03/12/2017 0929   K 3.2 (L) 08/21/2017 0733   K 2.8 (LL) 03/12/2017 0929   CL 101 06/18/2013 0030   CL 106 12/08/2011 1145   CO2 27 03/12/2017 0929   BUN 9.4 03/12/2017 0929   CREATININE 1.2 (H) 03/12/2017 0929      Component Value Date/Time   CALCIUM 9.5 03/12/2017 0929   ALKPHOS 125 03/12/2017 0929   AST 13 03/12/2017 0929   ALT 9 03/12/2017 0929   BILITOT <0.22 03/12/2017 0929      Impression and Plan: Kari Hahn is a very pleasant 47 yo caucasian female with iron deficiency anemia due to GI blood loss with Crohn's disease as well as heavy cycles. She also has history of idiopathic aortic thrombosis as well as DVT of the right lower extremity currently on Xarelto. She is doing well since receiving 2 doses of IV iron in July but still has some fatigue.  We will see what her iron studies show and bring her back in for infusion if needed.  We will plan to see her back in another 6 weeks for  follow-up.  She will contact our office with any questions or concerns. We can certainly see her sooner if need be.   Kari Peace, NP 8/27/20191:57 PM

## 2018-02-20 ENCOUNTER — Other Ambulatory Visit: Payer: Self-pay | Admitting: *Deleted

## 2018-02-20 DIAGNOSIS — I749 Embolism and thrombosis of unspecified artery: Secondary | ICD-10-CM

## 2018-02-20 DIAGNOSIS — K50919 Crohn's disease, unspecified, with unspecified complications: Secondary | ICD-10-CM

## 2018-02-20 LAB — IRON AND TIBC
Iron: 65 ug/dL (ref 41–142)
Saturation Ratios: 21 % (ref 21–57)
TIBC: 304 ug/dL (ref 236–444)
UIBC: 239 ug/dL

## 2018-02-20 LAB — FERRITIN: Ferritin: 97 ng/mL (ref 11–307)

## 2018-02-20 MED ORDER — RIVAROXABAN 20 MG PO TABS: 20.0000 mg | ORAL_TABLET | Freq: Every day | ORAL | 4 refills | Status: DC

## 2018-02-20 MED ORDER — ONDANSETRON HCL 8 MG PO TABS: 8.0000 mg | ORAL_TABLET | Freq: Two times a day (BID) | ORAL | 3 refills | Status: DC

## 2018-04-02 ENCOUNTER — Other Ambulatory Visit: Payer: Self-pay

## 2018-04-02 ENCOUNTER — Inpatient Hospital Stay: Payer: BLUE CROSS/BLUE SHIELD | Attending: Hematology & Oncology | Admitting: Family

## 2018-04-02 ENCOUNTER — Encounter: Payer: Self-pay | Admitting: Family

## 2018-04-02 ENCOUNTER — Inpatient Hospital Stay: Payer: BLUE CROSS/BLUE SHIELD

## 2018-04-02 VITALS — BP 84/62 | HR 101 | Temp 98.1°F | Resp 18 | Wt 165.8 lb

## 2018-04-02 VITALS — BP 103/45 | HR 85 | Resp 18

## 2018-04-02 DIAGNOSIS — I82431 Acute embolism and thrombosis of right popliteal vein: Secondary | ICD-10-CM

## 2018-04-02 DIAGNOSIS — I824Z1 Acute embolism and thrombosis of unspecified deep veins of right distal lower extremity: Secondary | ICD-10-CM | POA: Insufficient documentation

## 2018-04-02 DIAGNOSIS — D500 Iron deficiency anemia secondary to blood loss (chronic): Secondary | ICD-10-CM

## 2018-04-02 DIAGNOSIS — D509 Iron deficiency anemia, unspecified: Secondary | ICD-10-CM

## 2018-04-02 DIAGNOSIS — Z7901 Long term (current) use of anticoagulants: Secondary | ICD-10-CM | POA: Diagnosis not present

## 2018-04-02 DIAGNOSIS — K50911 Crohn's disease, unspecified, with rectal bleeding: Secondary | ICD-10-CM | POA: Diagnosis not present

## 2018-04-02 DIAGNOSIS — K922 Gastrointestinal hemorrhage, unspecified: Secondary | ICD-10-CM | POA: Diagnosis not present

## 2018-04-02 DIAGNOSIS — D510 Vitamin B12 deficiency anemia due to intrinsic factor deficiency: Secondary | ICD-10-CM | POA: Insufficient documentation

## 2018-04-02 LAB — CMP (CANCER CENTER ONLY)
ALBUMIN: 2.9 g/dL — AB (ref 3.5–5.0)
ALK PHOS: 104 U/L (ref 38–126)
ALT: 10 U/L (ref 0–44)
ANION GAP: 11 (ref 5–15)
AST: 25 U/L (ref 15–41)
BUN: 12 mg/dL (ref 6–20)
CALCIUM: 9.5 mg/dL (ref 8.9–10.3)
CO2: 24 mmol/L (ref 22–32)
CREATININE: 1.38 mg/dL — AB (ref 0.44–1.00)
Chloride: 104 mmol/L (ref 98–111)
GFR, Est AFR Am: 51 mL/min — ABNORMAL LOW (ref >60)
GFR, Estimated: 44 mL/min — ABNORMAL LOW (ref >60)
GLUCOSE: 138 mg/dL — AB (ref 70–99)
Potassium: 5 mmol/L (ref 3.5–5.1)
Sodium: 139 mmol/L (ref 135–145)
TOTAL PROTEIN: 6.2 g/dL — AB (ref 6.5–8.1)

## 2018-04-02 LAB — CBC WITH DIFFERENTIAL (CANCER CENTER ONLY)
Abs Immature Granulocytes: 0.27 10*3/uL — ABNORMAL HIGH (ref 0.00–0.07)
BASOS ABS: 0.1 10*3/uL (ref 0.0–0.1)
Basophils Relative: 1 %
EOS ABS: 0.3 10*3/uL (ref 0.0–0.5)
EOS PCT: 2 %
HEMATOCRIT: 26 % — AB (ref 36.0–46.0)
Hemoglobin: 8 g/dL — ABNORMAL LOW (ref 12.0–15.0)
Immature Granulocytes: 1 %
LYMPHS ABS: 2.5 10*3/uL (ref 0.7–4.0)
Lymphocytes Relative: 13 %
MCH: 30.7 pg (ref 26.0–34.0)
MCHC: 30.8 g/dL (ref 30.0–36.0)
MCV: 99.6 fL (ref 80.0–100.0)
Monocytes Absolute: 0.8 10*3/uL (ref 0.1–1.0)
Monocytes Relative: 4 %
NRBC: 0.2 % (ref 0.0–0.2)
Neutro Abs: 15.5 10*3/uL — ABNORMAL HIGH (ref 1.7–7.7)
Neutrophils Relative %: 79 %
Platelet Count: 604 10*3/uL — ABNORMAL HIGH (ref 150–400)
RBC: 2.61 MIL/uL — ABNORMAL LOW (ref 3.87–5.11)
RDW: 19.2 % — ABNORMAL HIGH (ref 11.5–15.5)
WBC Count: 19.4 10*3/uL — ABNORMAL HIGH (ref 4.0–10.5)

## 2018-04-02 LAB — RETICULOCYTES
Immature Retic Fract: 29.3 % — ABNORMAL HIGH (ref 2.3–15.9)
RBC.: 2.61 MIL/uL — AB (ref 3.87–5.11)
Retic Count, Absolute: 101.5 10*3/uL (ref 19.0–186.0)
Retic Ct Pct: 3.9 % — ABNORMAL HIGH (ref 0.4–3.1)

## 2018-04-02 MED ORDER — SODIUM CHLORIDE 0.9 % IV SOLN
Freq: Once | INTRAVENOUS | Status: AC
  Administered 2018-04-02: 13:00:00 via INTRAVENOUS
  Filled 2018-04-02: qty 250

## 2018-04-02 MED ORDER — SODIUM CHLORIDE 0.9 % IV SOLN
510.0000 mg | Freq: Once | INTRAVENOUS | Status: AC
  Administered 2018-04-02: 510 mg via INTRAVENOUS
  Filled 2018-04-02: qty 17

## 2018-04-02 MED ORDER — ONDANSETRON HCL 8 MG PO TABS
8.0000 mg | ORAL_TABLET | Freq: Once | ORAL | Status: AC
  Administered 2018-04-02: 8 mg via ORAL

## 2018-04-02 MED ORDER — ONDANSETRON HCL 8 MG PO TABS
ORAL_TABLET | ORAL | Status: AC
  Filled 2018-04-02: qty 1

## 2018-04-02 NOTE — Progress Notes (Addendum)
Hematology and Oncology Follow Up Visit  RAMANDA PAULES [id number] [birth year] 47 y.o. 04/02/2018   Principle Diagnosis:  Right lower extremity DVT Idiopathic aortic thrombosis Pernicious anemia Iron deficiency anemia Crohn's disease  Current Therapy:   Xarelto 20 mg PO daily Vitamin B12 PO BID daily IV iron as needed - last received in July2019   Interim History:  Ms. Lenig is here today with her son for follow-up. She is symptomatic with dizziness, fatigue, weakness, hearing her heart beat swishing in her ears and chills. She is not in any visible distress at this time.  She states that she has been on her cycle for 4 weeks and is changing her tampon hourly. Her gynecologist left the practice and she has appointment with a new provider on October 21st. I will forward today's note to Dr. Kristen Cardinal.  Hgb is down to 8.0, MCV 99, platelet count 604.  No other episodes of bleeding, no bruising or petechiae.  She has had no fever, chills, vomiting, cough, rash, dizziness, SOB, chest pain, palpitations, abdominal pain or changes in bowel or bladder habits.  She has had some numbness and tingling in her hands and feet off and on.  No swelling in her extremities or c/o pain at this time.  She has maintained a good appetite and she has started eating healthier. She is staying well hydrated. Her weight is stable.   ECOG Performance Status: 1 - Symptomatic but completely ambulatory  Medications:  Allergies as of 04/02/2018      Reactions   Eggs Or Egg-derived Products Nausea And Vomiting   Ibuprofen Other (See Comments)   stomache upset      Medication List        Accurate as of 04/02/18 12:23 PM. Always use your most recent med list.          AZO CRANBERRY 250-30 MG Tabs Take by mouth 2 (two) times daily.   baclofen 10 MG tablet Commonly known as:  LIORESAL Take 10 mg by mouth as needed.   EXFORGE 5-160 MG tablet Generic drug:  amLODipine-valsartan 1 TABLET BY MOUTH  DAILY FOR BLOOD PRESSURE--- takes in am after eaten   fluticasone 50 MCG/ACT nasal spray Commonly known as:  FLONASE daily as needed.   folic acid 1 MG tablet Commonly known as:  FOLVITE Take 1 mg by mouth daily.   gabapentin 800 MG tablet Commonly known as:  NEURONTIN Take 800 mg by mouth 3 (three) times daily.   HUMIRA PEN 40 MG/0.4ML Pnkt Generic drug:  Adalimumab   LEXAPRO 20 MG tablet Generic drug:  escitalopram Take 40 mg by mouth at bedtime. 2  20 mg tab = 40 mg daily   ondansetron 8 MG tablet Commonly known as:  ZOFRAN Take 1 tablet (8 mg total) by mouth 2 (two) times daily.   oxyCODONE-acetaminophen 10-325 MG tablet Commonly known as:  PERCOCET Take 1 tablet by mouth 4 (four) times daily.   pantoprazole 40 MG tablet Commonly known as:  PROTONIX Take 40 mg by mouth daily.   phenazopyridine 100 MG tablet Commonly known as:  PYRIDIUM Take 100 mg by mouth daily.   potassium chloride SA 20 MEQ tablet Commonly known as:  K-DUR,KLOR-CON Take 20 mEq by mouth every evening.   PROBIOTIC DAILY PO Take by mouth daily.   promethazine 25 MG tablet Commonly known as:  PHENERGAN Take 25 mg by mouth every 6 (six) hours as needed.   rivaroxaban 20 MG Tabs tablet Commonly known as:  XARELTO Take 1 tablet (20 mg total) by mouth daily with supper.   VITAMIN B 12 PO Take by mouth 2 (two) times daily.   vitamin C 1000 MG tablet Take 1,000 mg by mouth daily.   Vitamin D3 5000 units Tabs Take by mouth every morning.   zinc gluconate 50 MG tablet Take 50 mg by mouth daily.       Allergies:  Allergies  Allergen Reactions  . Eggs Or Egg-Derived Products Nausea And Vomiting  . Ibuprofen Other (See Comments)    stomache upset    Past Medical History, Surgical history, Social history, and Family History were reviewed and updated.  Review of Systems: All other 10 point review of systems is negative.   Physical Exam:  weight is 165 lb 12 oz (75.2 kg). Her oral  temperature is 98.1 F (36.7 C). Her blood pressure is 84/62 (abnormal) and her pulse is 101 (abnormal). Her respiration is 18 and oxygen saturation is 99%.   Wt Readings from Last 3 Encounters:  04/02/18 165 lb 12 oz (75.2 kg)  02/19/18 165 lb 12 oz (75.2 kg)  11/13/17 176 lb 1.9 oz (79.9 kg)    Ocular: Sclerae unicteric, pupils equal, round and reactive to light Ear-nose-throat: Oropharynx clear, dentition fair Lymphatic: No cervical, supraclavicular or axillary adenopathy Lungs no rales or rhonchi, good excursion bilaterally Heart regular rate and rhythm, no murmur appreciated Abd soft, nontender, positive bowel sounds, no liver or spleen tip palpated on exam, no fluid wave  MSK no focal spinal tenderness, no joint edema Neuro: non-focal, well-oriented, appropriate affect Breasts: Deferred   Lab Results  Component Value Date   WBC 19.4 (H) 04/02/2018   HGB 8.0 (L) 04/02/2018   HCT 26.0 (L) 04/02/2018   MCV 99.6 04/02/2018   PLT 604 (H) 04/02/2018   Lab Results  Component Value Date   FERRITIN 97 02/19/2018   IRON 65 02/19/2018   TIBC 304 02/19/2018   UIBC 239 02/19/2018   IRONPCTSAT 21 02/19/2018   Lab Results  Component Value Date   RETICCTPCT 3.9 (H) 04/02/2018   RBC 2.61 (L) 04/02/2018   RBC 2.61 (L) 04/02/2018   RETICCTABS 88.8 12/21/2014   No results found for: KPAFRELGTCHN, LAMBDASER, KAPLAMBRATIO No results found for: IGGSERUM, IGA, IGMSERUM No results found for: Odetta Pink, SPEI   Chemistry      Component Value Date/Time   NA 141 08/21/2017 0733   NA 141 03/12/2017 0929   K 3.2 (L) 08/21/2017 0733   K 2.8 (LL) 03/12/2017 0929   CL 101 06/18/2013 0030   CL 106 12/08/2011 1145   CO2 27 03/12/2017 0929   BUN 9.4 03/12/2017 0929   CREATININE 1.2 (H) 03/12/2017 0929      Component Value Date/Time   CALCIUM 9.5 03/12/2017 0929   ALKPHOS 125 03/12/2017 0929   AST 13 03/12/2017 0929   ALT 9  03/12/2017 0929   BILITOT <0.22 03/12/2017 0929       Impression and Plan: Mr. Hollingshead is a very pleasant 47 yo caucasian female with iron deficiency anemia due to GI blood loss with Crohn's and heavy menstrual bleeding.  She is quite symptomatic as mentioned above and Hgb has dropped from 15.9 1 month ago to 8.0.  We will give her IV iron today and plan to give another dose next week.  She will decrease her Xarelto to 10 mg PO daily.  She has an appointment with gynecology to discuss  how to manage her cycle.  Due to history of thrombus she can not use any estrogen based contraception.  We will see her back in another 3 weeks for follow-up and repeat lab work.  She will contact our office with any questions or concerns. We can certainly see her sooner if need be.   [name], NP 10/8/201912:23 PM

## 2018-04-02 NOTE — Progress Notes (Signed)
Pt. Refused to wait 30 min post infusion. Released stable and ASX. 

## 2018-04-03 LAB — IRON AND TIBC
Iron: 15 ug/dL — ABNORMAL LOW (ref 41–142)
Saturation Ratios: 4 % — ABNORMAL LOW (ref 21–57)
TIBC: 381 ug/dL (ref 236–444)
UIBC: 366 ug/dL

## 2018-04-03 LAB — FERRITIN: Ferritin: 19 ng/mL (ref 11–307)

## 2018-04-09 ENCOUNTER — Ambulatory Visit: Payer: BLUE CROSS/BLUE SHIELD

## 2018-04-10 ENCOUNTER — Inpatient Hospital Stay: Payer: BLUE CROSS/BLUE SHIELD

## 2018-04-10 VITALS — BP 134/55 | HR 86 | Temp 98.5°F | Resp 18

## 2018-04-10 DIAGNOSIS — D500 Iron deficiency anemia secondary to blood loss (chronic): Secondary | ICD-10-CM

## 2018-04-10 DIAGNOSIS — D509 Iron deficiency anemia, unspecified: Secondary | ICD-10-CM

## 2018-04-10 MED ORDER — SODIUM CHLORIDE 0.9 % IV SOLN
510.0000 mg | Freq: Once | INTRAVENOUS | Status: AC
  Administered 2018-04-10: 510 mg via INTRAVENOUS
  Filled 2018-04-10: qty 17

## 2018-04-10 MED ORDER — SODIUM CHLORIDE 0.9 % IV SOLN
Freq: Once | INTRAVENOUS | Status: AC
  Administered 2018-04-10: 09:00:00 via INTRAVENOUS
  Filled 2018-04-10: qty 250

## 2018-04-10 NOTE — Progress Notes (Signed)
Iv dc'd  Per patient request, did not want to wait the required 30 minutes post iv iron. ;

## 2018-04-23 ENCOUNTER — Inpatient Hospital Stay: Payer: BLUE CROSS/BLUE SHIELD

## 2018-04-23 ENCOUNTER — Inpatient Hospital Stay (HOSPITAL_BASED_OUTPATIENT_CLINIC_OR_DEPARTMENT_OTHER): Payer: BLUE CROSS/BLUE SHIELD | Admitting: Family

## 2018-04-23 VITALS — BP 138/71 | HR 74 | Temp 98.3°F | Resp 18 | Wt 165.0 lb

## 2018-04-23 DIAGNOSIS — D510 Vitamin B12 deficiency anemia due to intrinsic factor deficiency: Secondary | ICD-10-CM | POA: Diagnosis not present

## 2018-04-23 DIAGNOSIS — K50911 Crohn's disease, unspecified, with rectal bleeding: Secondary | ICD-10-CM

## 2018-04-23 DIAGNOSIS — Z7901 Long term (current) use of anticoagulants: Secondary | ICD-10-CM

## 2018-04-23 DIAGNOSIS — D500 Iron deficiency anemia secondary to blood loss (chronic): Secondary | ICD-10-CM | POA: Diagnosis not present

## 2018-04-23 DIAGNOSIS — I824Z1 Acute embolism and thrombosis of unspecified deep veins of right distal lower extremity: Secondary | ICD-10-CM

## 2018-04-23 DIAGNOSIS — K922 Gastrointestinal hemorrhage, unspecified: Secondary | ICD-10-CM | POA: Diagnosis not present

## 2018-04-23 DIAGNOSIS — I82431 Acute embolism and thrombosis of right popliteal vein: Secondary | ICD-10-CM

## 2018-04-23 DIAGNOSIS — I749 Embolism and thrombosis of unspecified artery: Secondary | ICD-10-CM

## 2018-04-23 LAB — CMP (CANCER CENTER ONLY)
ALK PHOS: 115 U/L (ref 38–126)
ALT: 9 U/L (ref 0–44)
AST: 15 U/L (ref 15–41)
Albumin: 2.9 g/dL — ABNORMAL LOW (ref 3.5–5.0)
Anion gap: 10 (ref 5–15)
BUN: 11 mg/dL (ref 6–20)
CALCIUM: 9.1 mg/dL (ref 8.9–10.3)
CHLORIDE: 106 mmol/L (ref 98–111)
CO2: 28 mmol/L (ref 22–32)
CREATININE: 1.49 mg/dL — AB (ref 0.44–1.00)
GFR, EST NON AFRICAN AMERICAN: 40 mL/min — AB (ref >60)
GFR, Est AFR Am: 47 mL/min — ABNORMAL LOW (ref >60)
Glucose, Bld: 102 mg/dL — ABNORMAL HIGH (ref 70–99)
Potassium: 3.9 mmol/L (ref 3.5–5.1)
Sodium: 144 mmol/L (ref 135–145)
Total Bilirubin: <0.2 mg/dL — ABNORMAL LOW (ref 0.3–1.2)
Total Protein: 6.1 g/dL — ABNORMAL LOW (ref 6.5–8.1)

## 2018-04-23 LAB — CBC WITH DIFFERENTIAL (CANCER CENTER ONLY)
Abs Immature Granulocytes: 0.07 10*3/uL (ref 0.00–0.07)
BASOS ABS: 0.1 10*3/uL (ref 0.0–0.1)
BASOS PCT: 1 %
EOS ABS: 0.1 10*3/uL (ref 0.0–0.5)
Eosinophils Relative: 1 %
HCT: 32.1 % — ABNORMAL LOW (ref 36.0–46.0)
Hemoglobin: 9.1 g/dL — ABNORMAL LOW (ref 12.0–15.0)
IMMATURE GRANULOCYTES: 1 %
LYMPHS ABS: 2.5 10*3/uL (ref 0.7–4.0)
LYMPHS PCT: 26 %
MCH: 29.5 pg (ref 26.0–34.0)
MCHC: 28.3 g/dL — ABNORMAL LOW (ref 30.0–36.0)
MCV: 104.2 fL — AB (ref 80.0–100.0)
Monocytes Absolute: 0.6 10*3/uL (ref 0.1–1.0)
Monocytes Relative: 6 %
Neutro Abs: 6.3 10*3/uL (ref 1.7–7.7)
Neutrophils Relative %: 65 %
PLATELETS: 514 10*3/uL — AB (ref 150–400)
RBC: 3.08 MIL/uL — ABNORMAL LOW (ref 3.87–5.11)
RDW: 17.6 % — AB (ref 11.5–15.5)
WBC Count: 9.7 10*3/uL (ref 4.0–10.5)
nRBC: 0 % (ref 0.0–0.2)

## 2018-04-23 LAB — IRON AND TIBC
IRON: 38 ug/dL — AB (ref 41–142)
Saturation Ratios: 14 % — ABNORMAL LOW (ref 21–57)
TIBC: 283 ug/dL (ref 236–444)
UIBC: 244 ug/dL (ref 120–384)

## 2018-04-23 LAB — RETICULOCYTES
IMMATURE RETIC FRACT: 29 % — AB (ref 2.3–15.9)
RBC.: 3.08 MIL/uL — AB (ref 3.87–5.11)
RETIC COUNT ABSOLUTE: 254.1 10*3/uL — AB (ref 19.0–186.0)
Retic Ct Pct: 8.3 % — ABNORMAL HIGH (ref 0.4–3.1)

## 2018-04-23 LAB — FERRITIN: Ferritin: 244 ng/mL (ref 11–307)

## 2018-04-23 NOTE — Progress Notes (Signed)
Hematology and Oncology Follow Up Visit  Kari Hahn [id number] [birth year] 47 y.o. 04/23/2018   Principle Diagnosis:  Right lower extremity DVT Idiopathic aortic thrombosis Pernicious anemia Iron deficiency anemia Crohn's disease  Current Therapy:   Xarelto 20 mg PO daily Vitamin B12 PO BID daily IV iron as needed - last received in July2019   Interim History:  Kari Hahn is here today with her son for follow-up. She has started her cycle which is quite heavy and she has had abdominal cramping/nausea (no vomiting).  She still noted some fatigue.  Hgb is improved at 9.1,  She has not noted any other blood loss. No bruising or petechiae.  No fever, chills, cough, rash, dizziness, SOB, chest pain, palpitations, abdominal pain or changes in bowel or bladder habits.  No swelling, tenderness, numbness or tingling in her extremities at this time.  No lymphadenopathy noted on exam.  She is eating well and staying hydrated. Her weight is stable.   ECOG Performance Status: 1 - Symptomatic but completely ambulatory  Medications:  Allergies as of 04/23/2018      Reactions   Eggs Or Egg-derived Products Nausea And Vomiting   Ibuprofen Other (See Comments)   stomache upset      Medication List        Accurate as of 04/23/18  9:18 AM. Always use your most recent med list.          AZO CRANBERRY 250-30 MG Tabs Take by mouth 2 (two) times daily.   baclofen 10 MG tablet Commonly known as:  LIORESAL Take 10 mg by mouth as needed.   EXFORGE 5-160 MG tablet Generic drug:  amLODipine-valsartan 1 TABLET BY MOUTH DAILY FOR BLOOD PRESSURE--- takes in am after eaten   fluticasone 50 MCG/ACT nasal spray Commonly known as:  FLONASE daily as needed.   folic acid 1 MG tablet Commonly known as:  FOLVITE Take 1 mg by mouth daily.   gabapentin 800 MG tablet Commonly known as:  NEURONTIN Take 800 mg by mouth 3 (three) times daily.   HUMIRA PEN 40 MG/0.4ML Pnkt Generic drug:   Adalimumab   LEXAPRO 20 MG tablet Generic drug:  escitalopram Take 40 mg by mouth at bedtime. 2  20 mg tab = 40 mg daily   ondansetron 8 MG tablet Commonly known as:  ZOFRAN Take 1 tablet (8 mg total) by mouth 2 (two) times daily.   oxyCODONE-acetaminophen 10-325 MG tablet Commonly known as:  PERCOCET Take 1 tablet by mouth 4 (four) times daily.   pantoprazole 40 MG tablet Commonly known as:  PROTONIX Take 40 mg by mouth daily.   potassium chloride SA 20 MEQ tablet Commonly known as:  K-DUR,KLOR-CON Take 20 mEq by mouth every evening.   PROBIOTIC DAILY PO Take by mouth daily.   promethazine 25 MG tablet Commonly known as:  PHENERGAN Take 25 mg by mouth every 6 (six) hours as needed.   rivaroxaban 20 MG Tabs tablet Commonly known as:  XARELTO Take 1 tablet (20 mg total) by mouth daily with supper.   VITAMIN B 12 PO Take by mouth 2 (two) times daily.   vitamin C 1000 MG tablet Take 1,000 mg by mouth daily.   Vitamin D3 5000 units Tabs Take by mouth every morning.   zinc gluconate 50 MG tablet Take 50 mg by mouth daily.       Allergies:  Allergies  Allergen Reactions  . Eggs Or Egg-Derived Products Nausea And Vomiting  . Ibuprofen Other (  See Comments)    stomache upset    Past Medical History, Surgical history, Social history, and Family History were reviewed and updated.  Review of Systems: All other 10 point review of systems is negative.   Physical Exam:  weight is 165 lb (74.8 kg). Her oral temperature is 98.3 F (36.8 C). Her blood pressure is 138/71 and her pulse is 74. Her respiration is 18 and oxygen saturation is 100%.   Wt Readings from Last 3 Encounters:  04/23/18 165 lb (74.8 kg)  04/02/18 165 lb 12 oz (75.2 kg)  02/19/18 165 lb 12 oz (75.2 kg)    Ocular: Sclerae unicteric, pupils equal, round and reactive to light Ear-nose-throat: Oropharynx clear, dentition fair Lymphatic: No cervical, supraclavicular or axillary adenopathy Lungs no  rales or rhonchi, good excursion bilaterally Heart regular rate and rhythm, no murmur appreciated Abd soft, nontender, positive bowel sounds, no liver or spleen tip palpated on exam, no fluid wave  MSK no focal spinal tenderness, no joint edema Neuro: non-focal, well-oriented, appropriate affect Breasts: Deferred   Lab Results  Component Value Date   WBC 9.7 04/23/2018   HGB 9.1 (L) 04/23/2018   HCT 32.1 (L) 04/23/2018   MCV 104.2 (H) 04/23/2018   PLT 514 (H) 04/23/2018   Lab Results  Component Value Date   FERRITIN 19 04/02/2018   IRON 15 (L) 04/02/2018   TIBC 381 04/02/2018   UIBC 366 04/02/2018   IRONPCTSAT 4 (L) 04/02/2018   Lab Results  Component Value Date   RETICCTPCT 8.3 (H) 04/23/2018   RBC 3.08 (L) 04/23/2018   RBC 3.08 (L) 04/23/2018   RETICCTABS 88.8 12/21/2014   No results found for: KPAFRELGTCHN, LAMBDASER, KAPLAMBRATIO No results found for: IGGSERUM, IGA, IGMSERUM No results found for: Odetta Pink, SPEI   Chemistry      Component Value Date/Time   NA 139 04/02/2018 1100   NA 141 03/12/2017 0929   K 5.0 04/02/2018 1100   K 2.8 (LL) 03/12/2017 0929   CL 104 04/02/2018 1100   CL 106 12/08/2011 1145   CO2 24 04/02/2018 1100   CO2 27 03/12/2017 0929   BUN 12 04/02/2018 1100   BUN 9.4 03/12/2017 0929   CREATININE 1.38 (H) 04/02/2018 1100   CREATININE 1.2 (H) 03/12/2017 0929      Component Value Date/Time   CALCIUM 9.5 04/02/2018 1100   CALCIUM 9.5 03/12/2017 0929   ALKPHOS 104 04/02/2018 1100   ALKPHOS 125 03/12/2017 0929   AST 25 04/02/2018 1100   AST 13 03/12/2017 0929   ALT 10 04/02/2018 1100   ALT 9 03/12/2017 0929   BILITOT <0.2 (L) 04/02/2018 1100   BILITOT <0.22 03/12/2017 0929      Impression and Plan: Kari Hahn is a very pleasant 47 yo caucasian female with iron deficiency anemia due to GI blood loss with Crohn's and heavy menstrual bleeding.  She is feeling a bit better since  receiving IV iron earlier this month.  We will see what her iron studies show and bring her back in for infusion if needed.  We will see her again in 6 weeks for follow-up.  She will contact our office with any questions or concerns. We can certainly see her sooner if need be.   [name], NP 10/29/20199:18 AM

## 2018-04-26 ENCOUNTER — Telehealth: Payer: Self-pay | Admitting: *Deleted

## 2018-04-26 NOTE — Telephone Encounter (Signed)
Message received from patient asking if she needs iron or not from most recent labs.  Per S. [place] NP, pt does need Feraheme and message sent to scheduler.  Call placed back to patient to inform her of orders, but no answer on phone number provided by patient.

## 2018-05-01 ENCOUNTER — Telehealth: Payer: Self-pay | Admitting: Hematology & Oncology

## 2018-05-01 NOTE — Telephone Encounter (Signed)
Unable to reach patient to schedule iv iron appts. Tried both numbers but lines just continue to ring without the option to leave a voice message.

## 2018-05-27 ENCOUNTER — Other Ambulatory Visit: Payer: Self-pay | Admitting: Urology

## 2018-06-05 ENCOUNTER — Encounter (HOSPITAL_BASED_OUTPATIENT_CLINIC_OR_DEPARTMENT_OTHER): Payer: Self-pay | Admitting: *Deleted

## 2018-06-06 ENCOUNTER — Ambulatory Visit: Payer: BLUE CROSS/BLUE SHIELD | Admitting: Family

## 2018-06-06 ENCOUNTER — Telehealth: Payer: Self-pay | Admitting: Hematology & Oncology

## 2018-06-06 ENCOUNTER — Other Ambulatory Visit: Payer: BLUE CROSS/BLUE SHIELD

## 2018-06-06 NOTE — Telephone Encounter (Signed)
Patient called to r/s appts/updated letter/calendar mailed per pt request

## 2018-06-06 NOTE — Progress Notes (Signed)
Spoke with Coni to to verify phone number the numbers we have are the same one's Coni has for Ms. Ronnald Ramp.

## 2018-06-07 ENCOUNTER — Encounter (HOSPITAL_BASED_OUTPATIENT_CLINIC_OR_DEPARTMENT_OTHER): Payer: Self-pay | Admitting: *Deleted

## 2018-06-07 ENCOUNTER — Other Ambulatory Visit: Payer: Self-pay

## 2018-06-07 NOTE — Progress Notes (Addendum)
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

## 2018-06-10 NOTE — Anesthesia Preprocedure Evaluation (Addendum)
Anesthesia Evaluation  Patient identified by MRN, date of birth, ID band Patient awake    Reviewed: Allergy & Precautions, H&P , NPO status , Patient's Chart, lab work & pertinent test results  History of Anesthesia Complications Negative for: history of anesthetic complications  Airway Mallampati: I  TM Distance: <3 FB Neck ROM: Full    Dental no notable dental hx. (+) Dental Advisory Given, Edentulous Upper, Edentulous Lower   Pulmonary Current Smoker,  Smoked cig.this am    Pulmonary exam normal breath sounds clear to auscultation       Cardiovascular hypertension, Pt. on medications Normal cardiovascular exam Rhythm:Regular Rate:Normal     Neuro/Psych Anxiety Depression negative neurological ROS     GI/Hepatic Neg liver ROS, GERD  Medicated and Controlled,  Endo/Other  negative endocrine ROSMorbid obesity  Renal/GU Renal diseaseLeft renal stone     Musculoskeletal   Abdominal   Peds  Hematology  (+) Blood dyscrasia, anemia ,   Anesthesia Other Findings   Reproductive/Obstetrics                             Anesthesia Physical  Anesthesia Plan  ASA: III  Anesthesia Plan: MAC   Post-op Pain Management:    Induction: Intravenous  PONV Risk Score and Plan: 3 and Ondansetron, Midazolam, Treatment may vary due to age or medical condition and Dexamethasone  Airway Management Planned: Nasal Cannula, Natural Airway and Mask  Additional Equipment:   Intra-op Plan:   Post-operative Plan:   Informed Consent: I have reviewed the patients History and Physical, chart, labs and discussed the procedure including the risks, benefits and alternatives for the proposed anesthesia with the patient or authorized representative who has indicated his/her understanding and acceptance.   Dental advisory given  Plan Discussed with: CRNA, Anesthesiologist and Surgeon  Anesthesia Plan Comments:  ( )      Anesthesia Quick Evaluation

## 2018-06-11 ENCOUNTER — Ambulatory Visit (HOSPITAL_BASED_OUTPATIENT_CLINIC_OR_DEPARTMENT_OTHER)
Admission: RE | Admit: 2018-06-11 | Discharge: 2018-06-11 | Disposition: A | Discharge status: Home / Self Care | Payer: BLUE CROSS/BLUE SHIELD | Attending: Urology | Admitting: Urology

## 2018-06-11 ENCOUNTER — Ambulatory Visit (HOSPITAL_BASED_OUTPATIENT_CLINIC_OR_DEPARTMENT_OTHER): Payer: BLUE CROSS/BLUE SHIELD | Admitting: Anesthesiology

## 2018-06-11 ENCOUNTER — Encounter (HOSPITAL_BASED_OUTPATIENT_CLINIC_OR_DEPARTMENT_OTHER): Payer: Self-pay

## 2018-06-11 ENCOUNTER — Encounter (HOSPITAL_BASED_OUTPATIENT_CLINIC_OR_DEPARTMENT_OTHER)
Admission: RE | Disposition: A | Discharge status: Home / Self Care | Payer: Self-pay | Source: Home / Self Care | Attending: Urology

## 2018-06-11 DIAGNOSIS — R7303 Prediabetes: Secondary | ICD-10-CM | POA: Insufficient documentation

## 2018-06-11 DIAGNOSIS — F17210 Nicotine dependence, cigarettes, uncomplicated: Secondary | ICD-10-CM | POA: Diagnosis not present

## 2018-06-11 DIAGNOSIS — K219 Gastro-esophageal reflux disease without esophagitis: Secondary | ICD-10-CM | POA: Insufficient documentation

## 2018-06-11 DIAGNOSIS — F418 Other specified anxiety disorders: Secondary | ICD-10-CM | POA: Diagnosis not present

## 2018-06-11 DIAGNOSIS — M1990 Unspecified osteoarthritis, unspecified site: Secondary | ICD-10-CM | POA: Diagnosis not present

## 2018-06-11 DIAGNOSIS — I129 Hypertensive chronic kidney disease with stage 1 through stage 4 chronic kidney disease, or unspecified chronic kidney disease: Secondary | ICD-10-CM | POA: Diagnosis not present

## 2018-06-11 DIAGNOSIS — D509 Iron deficiency anemia, unspecified: Secondary | ICD-10-CM | POA: Diagnosis not present

## 2018-06-11 DIAGNOSIS — Z8719 Personal history of other diseases of the digestive system: Secondary | ICD-10-CM | POA: Diagnosis not present

## 2018-06-11 DIAGNOSIS — Z6830 Body mass index (BMI) 30.0-30.9, adult: Secondary | ICD-10-CM | POA: Diagnosis not present

## 2018-06-11 DIAGNOSIS — N183 Chronic kidney disease, stage 3 (moderate): Secondary | ICD-10-CM | POA: Insufficient documentation

## 2018-06-11 DIAGNOSIS — K5090 Crohn's disease, unspecified, without complications: Secondary | ICD-10-CM | POA: Diagnosis not present

## 2018-06-11 DIAGNOSIS — Z7901 Long term (current) use of anticoagulants: Secondary | ICD-10-CM | POA: Insufficient documentation

## 2018-06-11 DIAGNOSIS — Z87442 Personal history of urinary calculi: Secondary | ICD-10-CM | POA: Insufficient documentation

## 2018-06-11 DIAGNOSIS — N3946 Mixed incontinence: Secondary | ICD-10-CM | POA: Diagnosis not present

## 2018-06-11 DIAGNOSIS — Z86718 Personal history of other venous thrombosis and embolism: Secondary | ICD-10-CM | POA: Insufficient documentation

## 2018-06-11 DIAGNOSIS — Z79899 Other long term (current) drug therapy: Secondary | ICD-10-CM | POA: Insufficient documentation

## 2018-06-11 HISTORY — DX: Acute embolism and thrombosis of unspecified deep veins of right lower extremity: I82.401

## 2018-06-11 HISTORY — DX: Other complications of anesthesia, initial encounter: T88.59XA

## 2018-06-11 HISTORY — DX: Prediabetes: R73.03

## 2018-06-11 HISTORY — PX: CYSTOSCOPY: SHX5120

## 2018-06-11 HISTORY — DX: Abnormal uterine and vaginal bleeding, unspecified: N93.9

## 2018-06-11 HISTORY — PX: BOTOX INJECTION: SHX5754

## 2018-06-11 HISTORY — DX: Urinary tract infection, site not specified: N39.0

## 2018-06-11 HISTORY — DX: Other specified health status: Z78.9

## 2018-06-11 HISTORY — DX: Adverse effect of unspecified anesthetic, initial encounter: T41.45XA

## 2018-06-11 LAB — POCT I-STAT 4, (NA,K, GLUC, HGB,HCT)
Glucose, Bld: 96 mg/dL (ref 70–99)
HCT: 38 % (ref 36.0–46.0)
Hemoglobin: 12.9 g/dL (ref 12.0–15.0)
Potassium: 3.2 mmol/L — ABNORMAL LOW (ref 3.5–5.1)
SODIUM: 139 mmol/L (ref 135–145)

## 2018-06-11 LAB — POCT PREGNANCY, URINE: Preg Test, Ur: NEGATIVE

## 2018-06-11 SURGERY — BOTOX INJECTION
Anesthesia: General | Site: Urethra

## 2018-06-11 MED ORDER — ONDANSETRON HCL 4 MG/2ML IJ SOLN
4.0000 mg | Freq: Once | INTRAMUSCULAR | Status: DC | PRN
  Filled 2018-06-11: qty 2

## 2018-06-11 MED ORDER — SODIUM CHLORIDE 0.9 % IV SOLN
INTRAVENOUS | Status: DC
  Administered 2018-06-11: 06:00:00 via INTRAVENOUS
  Filled 2018-06-11: qty 1000

## 2018-06-11 MED ORDER — MEPERIDINE HCL 25 MG/ML IJ SOLN
6.2500 mg | INTRAMUSCULAR | Status: DC | PRN
  Filled 2018-06-11: qty 1

## 2018-06-11 MED ORDER — PROPOFOL 10 MG/ML IV BOLUS
INTRAVENOUS | Status: AC
  Filled 2018-06-11: qty 20

## 2018-06-11 MED ORDER — ONDANSETRON HCL 4 MG/2ML IJ SOLN
INTRAMUSCULAR | Status: AC
  Filled 2018-06-11: qty 2

## 2018-06-11 MED ORDER — SODIUM CHLORIDE (PF) 0.9 % IJ SOLN
INTRAMUSCULAR | Status: DC | PRN
  Administered 2018-06-11: 10 mL via INTRAVENOUS

## 2018-06-11 MED ORDER — FENTANYL CITRATE (PF) 100 MCG/2ML IJ SOLN
25.0000 ug | INTRAMUSCULAR | Status: DC | PRN
  Filled 2018-06-11: qty 1

## 2018-06-11 MED ORDER — FENTANYL CITRATE (PF) 100 MCG/2ML IJ SOLN
INTRAMUSCULAR | Status: DC | PRN
  Administered 2018-06-11: 25 ug via INTRAVENOUS
  Administered 2018-06-11 (×3): 12.5 ug via INTRAVENOUS
  Administered 2018-06-11: 25 ug via INTRAVENOUS
  Administered 2018-06-11: 12.5 ug via INTRAVENOUS

## 2018-06-11 MED ORDER — CEFAZOLIN SODIUM-DEXTROSE 2-4 GM/100ML-% IV SOLN
2.0000 g | Freq: Once | INTRAVENOUS | Status: AC
  Administered 2018-06-11: 2 g via INTRAVENOUS
  Filled 2018-06-11: qty 100

## 2018-06-11 MED ORDER — CEFAZOLIN SODIUM-DEXTROSE 2-4 GM/100ML-% IV SOLN
INTRAVENOUS | Status: AC
  Filled 2018-06-11: qty 100

## 2018-06-11 MED ORDER — MIDAZOLAM HCL 5 MG/5ML IJ SOLN
INTRAMUSCULAR | Status: DC | PRN
  Administered 2018-06-11: 2 mg via INTRAVENOUS

## 2018-06-11 MED ORDER — DEXAMETHASONE SODIUM PHOSPHATE 10 MG/ML IJ SOLN
INTRAMUSCULAR | Status: AC
  Filled 2018-06-11: qty 1

## 2018-06-11 MED ORDER — ACETAMINOPHEN 325 MG PO TABS
325.0000 mg | ORAL_TABLET | ORAL | Status: DC | PRN
  Filled 2018-06-11: qty 2

## 2018-06-11 MED ORDER — LIDOCAINE 2% (20 MG/ML) 5 ML SYRINGE
INTRAMUSCULAR | Status: AC
  Filled 2018-06-11: qty 5

## 2018-06-11 MED ORDER — FENTANYL CITRATE (PF) 100 MCG/2ML IJ SOLN
INTRAMUSCULAR | Status: AC
  Filled 2018-06-11: qty 2

## 2018-06-11 MED ORDER — KETOROLAC TROMETHAMINE 30 MG/ML IJ SOLN
INTRAMUSCULAR | Status: AC
  Filled 2018-06-11: qty 1

## 2018-06-11 MED ORDER — SODIUM CHLORIDE 0.9 % IR SOLN
Status: DC | PRN
  Administered 2018-06-11: 3000 mL

## 2018-06-11 MED ORDER — MIDAZOLAM HCL 2 MG/2ML IJ SOLN
INTRAMUSCULAR | Status: AC
  Filled 2018-06-11: qty 2

## 2018-06-11 MED ORDER — OXYCODONE HCL 5 MG PO TABS
5.0000 mg | ORAL_TABLET | Freq: Once | ORAL | Status: DC | PRN
  Filled 2018-06-11: qty 1

## 2018-06-11 MED ORDER — ONDANSETRON HCL 4 MG/2ML IJ SOLN
INTRAMUSCULAR | Status: DC | PRN
  Administered 2018-06-11: 4 mg via INTRAVENOUS

## 2018-06-11 MED ORDER — PROPOFOL 10 MG/ML IV BOLUS
INTRAVENOUS | Status: DC | PRN
  Administered 2018-06-11: 30 mg via INTRAVENOUS

## 2018-06-11 MED ORDER — ACETAMINOPHEN 160 MG/5ML PO SOLN
325.0000 mg | ORAL | Status: DC | PRN
  Filled 2018-06-11: qty 20.3

## 2018-06-11 MED ORDER — OXYCODONE HCL 5 MG/5ML PO SOLN
5.0000 mg | Freq: Once | ORAL | Status: DC | PRN
  Filled 2018-06-11: qty 5

## 2018-06-11 MED ORDER — ONABOTULINUMTOXINA 100 UNITS IJ SOLR
INTRAMUSCULAR | Status: DC | PRN
  Administered 2018-06-11: 100 [IU] via INTRAMUSCULAR

## 2018-06-11 SURGICAL SUPPLY — 22 items
BAG DRAIN URO-CYSTO SKYTR STRL (DRAIN) ×3 IMPLANT
CATH ROBINSON RED A/P 14FR (CATHETERS) IMPLANT
CLOTH BEACON ORANGE TIMEOUT ST (SAFETY) ×3 IMPLANT
ELECT REM PT RETURN 9FT ADLT (ELECTROSURGICAL) ×3
ELECTRODE REM PT RTRN 9FT ADLT (ELECTROSURGICAL) ×1 IMPLANT
GLOVE BIO SURGEON STRL SZ7.5 (GLOVE) ×6 IMPLANT
GLOVE BIOGEL PI IND STRL 7.5 (GLOVE) ×1 IMPLANT
GLOVE BIOGEL PI INDICATOR 7.5 (GLOVE) ×2
GOWN STRL REUS W/ TWL LRG LVL3 (GOWN DISPOSABLE) ×1 IMPLANT
GOWN STRL REUS W/ TWL XL LVL3 (GOWN DISPOSABLE) ×2 IMPLANT
GOWN STRL REUS W/TWL LRG LVL3 (GOWN DISPOSABLE) ×2
GOWN STRL REUS W/TWL XL LVL3 (GOWN DISPOSABLE) ×4
KIT TURNOVER CYSTO (KITS) ×3 IMPLANT
MANIFOLD NEPTUNE II (INSTRUMENTS) ×3 IMPLANT
NEEDLE ASPIRATION 22 (NEEDLE) ×3 IMPLANT
NEEDLE HYPO 18GX1.5 BLUNT FILL (NEEDLE) ×3 IMPLANT
PACK CYSTO (CUSTOM PROCEDURE TRAY) ×3 IMPLANT
SYR 20CC LL (SYRINGE) IMPLANT
SYR CONTROL 10ML LL (SYRINGE) ×3 IMPLANT
TUBE CONNECTING 12'X1/4 (SUCTIONS) ×1
TUBE CONNECTING 12X1/4 (SUCTIONS) ×2 IMPLANT
WATER STERILE IRR 3000ML UROMA (IV SOLUTION) IMPLANT

## 2018-06-11 NOTE — Transfer of Care (Signed)
Immediate Anesthesia Transfer of Care Note  Patient: Kari Hahn  Procedure(s) Performed: Procedure(s) (LRB): BOTOX INJECTION WITH CYSTOSCOPY (N/A) CYSTOSCOPY (N/A)  Patient Location: PACU  Anesthesia Type: General  Level of Consciousness: awake, sedated, patient cooperative and responds to stimulation  Airway & Oxygen Therapy: Patient Spontanous Breathing and Patient connected to [place] oxygen  Post-op Assessment: Report given to PACU RN, Post -op Vital signs reviewed and stable and Patient moving all extremities  Post vital signs: Reviewed and stable  Complications: No apparent anesthesia complications

## 2018-06-11 NOTE — H&P (Signed)
Urology Preoperative H&P   Chief Complaint: Urge incontinence   History of Present Illness: Kari Hahn is a 48 y.o. female with a history of kidney stones, mixed urinary incontinence and Crohn's disease (currently on Humira).   She was started on Myrbetriq 50 mg for OAB sxs at her last OV and reports minimal improvement in her urge incontinence. She have tried oxybutynin and toviaz in the past with no improvement. She states that she is still wearing 6-7 pads per day and will wake up in the middle the night with a completely soaked pad. She also reports persistent leakage with laughing, coughing and sneezing. 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 When asked which type of incontinence, stress or urge, is more bothersome she states that they're equal. She denies interval urinary tract infections, dysuria or hematuria. UA today is concerning for acute cystitis.   Urine culture from 12/14/2017 grew Enterobacter cloaca--treated with a one-week course of Cipro.  SUI=UUI G4P2 (c-section x2).   The patient did have a prior history of fecal incontinence due to her Crohn's disease, but was recently started on Humira which has drastically improved her GI symptoms.   UDS from 04/08/2018 showed a hypersensitive bladder with a very low filling capacity at 100 ml.     Past Medical History:  Diagnosis Date  . Abnormal uterine bleeding (AUB)   . Anemia, iron deficiency    followed by hemotologist-- dr Marin Olp--  due to chronic blood loss, abnormal uterine bleeding--- treatment IV Iron infusions  . Anemia, pernicious   . Anxiety   . Arthritis    knees , back  . Borderline diabetes    does not check cbg at home  . CKD (chronic kidney disease), stage III ([place])   . Complication of anesthesia    slow to awaken 1998 respiration slow also  . Crohn disease ([place])    dx 09/2007  . Deep vein  thrombosis (DVT) of right lower extremity ([place]) 11/22/2017   popliteal vein  . Frequency of urination   . GERD (gastroesophageal reflux disease)   . History of gastric ulcer    2009--- esophagus and gastrium ulcers, candida  . History of kidney stones   . 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 Hypertension   . 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 Neuropathy of leg    right leg  . Urge urinary incontinence   . UTI (urinary tract infection) 05-13-18 on macrobid   had on and off for 5 months  . Wears glasses     Past Surgical History:  Procedure Laterality Date  . CESAREAN SECTION  x2  last one 1998  . CHOLECYSTECTOMY  03/29/2012   Procedure: LAPAROSCOPIC CHOLECYSTECTOMY WITH INTRAOPERATIVE CHOLANGIOGRAM;  Surgeon: Odis Hollingshead, MD;  Location: [facility];  Service: General;  Laterality: N/A;  laparoscopic cholecystectomy with intraopertaive choloangiogram  . CYSTOSCOPY W/ URETERAL STENT PLACEMENT  2005  APPROX.  Marland Kitchen CYSTOSCOPY/URETEROSCOPY/HOLMIUM LASER/STENT PLACEMENT Left 08/21/2017   Procedure: CYSTOSCOPY/RETROGRADE/URETEROSCOPY/HOLMIUM LASER/STENT PLACEMENT;  Surgeon: Ceasar Mons, MD;  Location[facility];  Service: Urology;  Laterality: Left;  . EXTRACORPOREAL SHOCK WAVE LITHOTRIPSY  2005 approx.  Marland Kitchen  KNEE SURGERY Left age 39  . LAPAROSCOPIC NISSEN FUNDOPLICATION  1610  approx.  . TRANSTHORACIC ECHOCARDIOGRAM  08/06/2009   ef 96%, grade 1 diastolic dysfunction/  mild LAE    Allergies:  Allergies  Allergen Reactions  . Eggs Or Egg-Derived Products Nausea And Vomiting    When eats too much  . Ibuprofen Other (See Comments)    stomache upset    History reviewed. No pertinent family history.  Social History:  reports that  she has been smoking cigarettes. She started smoking about 33 years ago. She has a 68.00 pack-year smoking history. She has never used smokeless tobacco. She reports that she does not drink alcohol or use drugs.  ROS: A complete review of systems was performed.  All systems are negative except for pertinent findings as noted.  Physical Exam:  Vital signs in last 24 hours: Temp:  [98.5 F (36.9 C)] 98.5 F (36.9 C) (12/17 0532) Pulse Rate:  [92] 92 (12/17 0532) Resp:  [16] 16 (12/17 0532) BP: (149)/(92) 149/92 (12/17 0532) SpO2:  [96 %] 96 % (12/17 0532) Weight:  [75.1 kg] 75.1 kg (12/17 0532) Constitutional:  Alert and oriented, No acute distress Cardiovascular: Regular rate and rhythm, No JVD Respiratory: Normal respiratory effort, Lungs clear bilaterally GI: Abdomen is soft, nontender, nondistended, no abdominal masses GU: No CVA tenderness Lymphatic: No lymphadenopathy Neurologic: Grossly intact, no focal deficits Psychiatric: Normal mood and affect  Laboratory Data:  Recent Labs    06/11/18 0607  HGB 12.9  HCT 38.0    Recent Labs    06/11/18 0607  NA 139  K 3.2*  GLUCOSE 96     Results for orders placed or performed during the hospital encounter of 06/11/18 (from the past 24 hour(s))  Pregnancy, urine POC     Status: None   Collection Time: 06/11/18  5:40 AM  Result Value Ref Range   Preg Test, Ur NEGATIVE NEGATIVE  I-STAT 4, (NA,K, GLUC, HGB,HCT)     Status: Abnormal   Collection Time: 06/11/18  6:07 AM  Result Value Ref Range   Sodium 139 135 - 145 mmol/L   Potassium 3.2 (L) 3.5 - 5.1 mmol/L   Glucose, Bld 96 70 - 99 mg/dL   HCT 38.0 36.0 - 46.0 %   Hemoglobin 12.9 12.0 - 15.0 g/dL   No results found for this or any previous visit (from the past 240 hour(s)).  Renal Function: No results for input(s): CREATININE in the last 168 hours. CrCl cannot be calculated (Patient's most recent lab result is older than the maximum 21 days allowed.).  Radiologic  Imaging: No results found.  I independently reviewed the above imaging studies.  Assessment and Plan Kari Hahn is a 47 y.o. female with urge incontinence  -The risks, benefits and alternatives of cystoscopy with botox injections was discussed with the patient. Risks include, but are not limited to, bleeding, UTI, urinary retention, worsening bladder symptoms and the inherent risks with general anesthesia. She voices understanding and wishes to proceed.  -Will consider a urethral bulking agent if she continues to have stress incontinence after treatment with botox.   Ellison Hughs, MD 06/11/2018, 7:30 AM  [facility] Pager: [phone]

## 2018-06-11 NOTE — Anesthesia Postprocedure Evaluation (Signed)
Anesthesia Post Note  Patient: Kari Hahn  Procedure(s) Performed: BOTOX INJECTION WITH CYSTOSCOPY (N/A Urethra) CYSTOSCOPY (N/A Urethra)     Patient location during evaluation: PACU Anesthesia Type: General Level of consciousness: awake and alert Pain management: pain level controlled Vital Signs Assessment: post-procedure vital signs reviewed and stable Respiratory status: spontaneous breathing, nonlabored ventilation, respiratory function stable and patient connected to nasal cannula oxygen Cardiovascular status: blood pressure returned to baseline and stable Postop Assessment: no apparent nausea or vomiting Anesthetic complications: no    Last Vitals:  Vitals:   06/11/18 0830 06/11/18 0900  BP: (!) 147/82 (!) 147/91  Pulse: 80 82  Resp: 15 16  Temp:  36.6 C  SpO2: 94% 96%    Last Pain:  Vitals:   06/11/18 0900  TempSrc:   PainSc: 0-No pain                 Idalys Konecny

## 2018-06-11 NOTE — Op Note (Signed)
Operative Note  Preoperative diagnosis:  1.  Urge incontinence Postoperative diagnosis: 1.  Same  Procedure(s): 1.  Cystoscopy with Botox injections (100 units)  Surgeon: Ellison Hughs, MD  Assistants:  None  Anesthesia:  MAC  Complications:  None  EBL: Less than 5 mL  Specimens: 1.  None  Drains/Catheters: 1.  None  Intraoperative findings:   1. No intravesical pathology was identified on cystoscopy  Indication:  Kari Hahn is a 47 y.o. female with refractory urge incontinence.  She has been consented for the above procedures, voices understanding and wishes to proceed.  Description of procedure:  After informed consent was obtained, the patient was brought to the operating room and MAC anesthesia was administered. The patient was then placed in the dorsolithotomy position and prepped and draped in usual sterile fashion. A timeout was performed. A 23 French rigid cystoscope was then inserted into the urethral meatus and advanced into the bladder under direct vision. A complete bladder survey revealed no intravesical pathology.  Injectable needle was then used to penetrate the bladder mucosa and 100 units of Botox was then injected in a grid like fashion throughout the bladder and 0.5 mL increments.  Following injections of the Botox, there was no evidence of active bleeding.  The patient's bladder was drained.  She tolerated the procedure well and was transferred to the postanesthesia in stable condition.  Plan: Follow-up on 06/25/2018 with [facility]

## 2018-06-11 NOTE — Discharge Instructions (Signed)

## 2018-06-12 ENCOUNTER — Encounter (HOSPITAL_BASED_OUTPATIENT_CLINIC_OR_DEPARTMENT_OTHER): Payer: Self-pay | Admitting: Urology

## 2018-06-14 ENCOUNTER — Inpatient Hospital Stay: Payer: BLUE CROSS/BLUE SHIELD | Attending: Hematology & Oncology

## 2018-06-14 ENCOUNTER — Inpatient Hospital Stay: Payer: BLUE CROSS/BLUE SHIELD | Admitting: Family

## 2018-07-20 ENCOUNTER — Other Ambulatory Visit: Payer: Self-pay | Admitting: Family

## 2018-07-20 DIAGNOSIS — K50919 Crohn's disease, unspecified, with unspecified complications: Secondary | ICD-10-CM

## 2018-08-21 ENCOUNTER — Inpatient Hospital Stay (HOSPITAL_BASED_OUTPATIENT_CLINIC_OR_DEPARTMENT_OTHER): Payer: BLUE CROSS/BLUE SHIELD | Admitting: Family

## 2018-08-21 ENCOUNTER — Inpatient Hospital Stay: Payer: BLUE CROSS/BLUE SHIELD | Attending: Hematology & Oncology

## 2018-08-21 ENCOUNTER — Telehealth: Payer: Self-pay | Admitting: Family

## 2018-08-21 ENCOUNTER — Other Ambulatory Visit: Payer: Self-pay

## 2018-08-21 VITALS — BP 147/73 | HR 90 | Temp 98.3°F | Resp 20 | Wt 173.8 lb

## 2018-08-21 DIAGNOSIS — I749 Embolism and thrombosis of unspecified artery: Secondary | ICD-10-CM

## 2018-08-21 DIAGNOSIS — F17210 Nicotine dependence, cigarettes, uncomplicated: Secondary | ICD-10-CM | POA: Diagnosis not present

## 2018-08-21 DIAGNOSIS — D500 Iron deficiency anemia secondary to blood loss (chronic): Secondary | ICD-10-CM | POA: Diagnosis present

## 2018-08-21 DIAGNOSIS — N920 Excessive and frequent menstruation with regular cycle: Secondary | ICD-10-CM | POA: Diagnosis not present

## 2018-08-21 DIAGNOSIS — I82431 Acute embolism and thrombosis of right popliteal vein: Secondary | ICD-10-CM

## 2018-08-21 DIAGNOSIS — I824Z1 Acute embolism and thrombosis of unspecified deep veins of right distal lower extremity: Secondary | ICD-10-CM | POA: Diagnosis not present

## 2018-08-21 DIAGNOSIS — Z7901 Long term (current) use of anticoagulants: Secondary | ICD-10-CM

## 2018-08-21 DIAGNOSIS — E538 Deficiency of other specified B group vitamins: Secondary | ICD-10-CM

## 2018-08-21 DIAGNOSIS — K50911 Crohn's disease, unspecified, with rectal bleeding: Secondary | ICD-10-CM | POA: Insufficient documentation

## 2018-08-21 LAB — CMP (CANCER CENTER ONLY)
ALT: 22 U/L (ref 0–44)
AST: 26 U/L (ref 15–41)
Albumin: 4.4 g/dL (ref 3.5–5.0)
Alkaline Phosphatase: 112 U/L (ref 38–126)
Anion gap: 12 (ref 5–15)
BUN: 14 mg/dL (ref 6–20)
CO2: 32 mmol/L (ref 22–32)
Calcium: 10.5 mg/dL — ABNORMAL HIGH (ref 8.9–10.3)
Chloride: 98 mmol/L (ref 98–111)
Creatinine: 1.44 mg/dL — ABNORMAL HIGH (ref 0.44–1.00)
GFR, Est AFR Am: 49 mL/min — ABNORMAL LOW (ref >60)
GFR, Estimated: 43 mL/min — ABNORMAL LOW (ref >60)
Glucose, Bld: 130 mg/dL — ABNORMAL HIGH (ref 70–99)
Potassium: 3.3 mmol/L — ABNORMAL LOW (ref 3.5–5.1)
Sodium: 142 mmol/L (ref 135–145)
Total Bilirubin: 0.3 mg/dL (ref 0.3–1.2)
Total Protein: 7.8 g/dL (ref 6.5–8.1)

## 2018-08-21 LAB — CBC WITH DIFFERENTIAL (CANCER CENTER ONLY)
Abs Immature Granulocytes: 0.07 10*3/uL (ref 0.00–0.07)
Basophils Absolute: 0.1 10*3/uL (ref 0.0–0.1)
Basophils Relative: 1 %
Eosinophils Absolute: 0.4 10*3/uL (ref 0.0–0.5)
Eosinophils Relative: 3 %
HCT: 39.5 % (ref 36.0–46.0)
Hemoglobin: 11.9 g/dL — ABNORMAL LOW (ref 12.0–15.0)
Immature Granulocytes: 1 %
Lymphocytes Relative: 20 %
Lymphs Abs: 2.3 10*3/uL (ref 0.7–4.0)
MCH: 26.9 pg (ref 26.0–34.0)
MCHC: 30.1 g/dL (ref 30.0–36.0)
MCV: 89.4 fL (ref 80.0–100.0)
Monocytes Absolute: 0.9 10*3/uL (ref 0.1–1.0)
Monocytes Relative: 8 %
Neutro Abs: 7.6 10*3/uL (ref 1.7–7.7)
Neutrophils Relative %: 67 %
Platelet Count: 545 10*3/uL — ABNORMAL HIGH (ref 150–400)
RBC: 4.42 MIL/uL (ref 3.87–5.11)
RDW: 18.6 % — ABNORMAL HIGH (ref 11.5–15.5)
WBC Count: 11.4 10*3/uL — ABNORMAL HIGH (ref 4.0–10.5)
nRBC: 0 % (ref 0.0–0.2)

## 2018-08-21 LAB — RETICULOCYTES
Immature Retic Fract: 30.3 % — ABNORMAL HIGH (ref 2.3–15.9)
RBC.: 4.42 MIL/uL (ref 3.87–5.11)
Retic Count, Absolute: 96.8 10*3/uL (ref 19.0–186.0)
Retic Ct Pct: 2.2 % (ref 0.4–3.1)

## 2018-08-21 LAB — IRON AND TIBC
Iron: 27 ug/dL — ABNORMAL LOW (ref 41–142)
Saturation Ratios: 5 % — ABNORMAL LOW (ref 21–57)
TIBC: 521 ug/dL — ABNORMAL HIGH (ref 236–444)
UIBC: 495 ug/dL — ABNORMAL HIGH (ref 120–384)

## 2018-08-21 LAB — FERRITIN: Ferritin: 16 ng/mL (ref 11–307)

## 2018-08-21 NOTE — Progress Notes (Signed)
Hematology and Oncology Follow Up Visit  Kari Hahn [id number] 29-Dec-1970 48 y.o. 08/21/2018   Principle Diagnosis:  Right lower extremity DVT Idiopathic aortic thrombosis Pernicious anemia Iron deficiency anemia Crohn's disease  Current Therapy:   Xarelto 20 mg PO daily Vitamin B12 PO BID daily IV iron as needed   Interim History:  Kari Hahn is here today with her son for follow-up. She is symptomatic with fatigue.  She has occasional SOB and cough with exertion. She is still smoking.  She has post phlebitic pain in both lower extremities that comes and goes. No swelling at this time. Pedal pulses are 2+.  No numbness or tingling in her extremities at this time.  She is taking her Xarelto daily as prescribed. No episodes of bleeding, no bruising or petechiae.  Her last cycle was in January and was light. She states that she is starting to go through the change of life and is having hot flashes.  No fever, chills, n/v, rash, dizziness, chest pain, palpitations, abdominal pain or changes in bowel or bladder habits.  No lymphadenopathy noted on exam.  She has maintained a good appetite and is staying well hydrated. Her weight is stable.   ECOG Performance Status: 1 - Symptomatic but completely ambulatory  Medications:  Allergies as of 08/21/2018      Reactions   Eggs Or Egg-derived Products Nausea And Vomiting   When eats too much   Ibuprofen Other (See Comments)   stomache upset      Medication List       Accurate as of August 21, 2018  8:49 AM. Always use your most recent med list.        desmopressin 0.2 MG tablet Commonly known as:  DDAVP   EXFORGE 5-160 MG tablet Generic drug:  amLODipine-valsartan 1 TABLET BY MOUTH DAILY FOR BLOOD PRESSURE--- takes in am after eaten   fluticasone 50 MCG/ACT nasal spray Commonly known as:  FLONASE daily as needed.   folic acid 1 MG tablet Commonly known as:  FOLVITE Take 1 mg by mouth daily.   gabapentin 800 MG  tablet Commonly known as:  NEURONTIN Take 800 mg by mouth 3 (three) times daily.   HUMIRA PEN 40 MG/0.4ML Pnkt Generic drug:  Adalimumab Every 2 weeks   LEXAPRO 20 MG tablet Generic drug:  escitalopram Take 40 mg by mouth at bedtime. 2  20 mg tab = 40 mg daily   MACROBID 100 MG capsule Generic drug:  nitrofurantoin (macrocrystal-monohydrate) Take 100 mg by mouth daily. Started on 05-13-18   ondansetron 8 MG tablet Commonly known as:  ZOFRAN TAKE 1 TABLET BY MOUTH 2 TIMES A DAY   oxyCODONE-acetaminophen 10-325 MG tablet Commonly known as:  PERCOCET Take 1 tablet by mouth 4 (four) times daily.   pantoprazole 40 MG tablet Commonly known as:  PROTONIX Take 40 mg by mouth daily.   potassium chloride SA 20 MEQ tablet Commonly known as:  K-DUR,KLOR-CON Take 20 mEq by mouth every evening.   promethazine 25 MG tablet Commonly known as:  PHENERGAN Take 25 mg by mouth every 6 (six) hours as needed.   rivaroxaban 20 MG Tabs tablet Commonly known as:  XARELTO Take 1 tablet (20 mg total) by mouth daily with supper.   VITAMIN B 12 PO Take by mouth daily.   vitamin C 1000 MG tablet Take 1,000 mg by mouth daily.   Vitamin D3 125 MCG (5000 UT) Tabs Take by mouth every morning.   zinc gluconate  50 MG tablet Take 50 mg by mouth daily.       Allergies:  Allergies  Allergen Reactions  . Eggs Or Egg-Derived Products Nausea And Vomiting    When eats too much  . Ibuprofen Other (See Comments)    stomache upset    Past Medical History, Surgical history, Social history, and Family History were reviewed and updated.  Review of Systems: All other 10 point review of systems is negative.   Physical Exam:  vitals were not taken for this visit.   Wt Readings from Last 3 Encounters:  06/11/18 165 lb 8 oz (75.1 kg)  04/23/18 165 lb (74.8 kg)  04/02/18 165 lb 12 oz (75.2 kg)    Ocular: Sclerae unicteric, pupils equal, round and reactive to light Ear-nose-throat: Oropharynx  clear, dentition fair Lymphatic: No cervical, supraclavicular or axillary adenopathy Lungs no rales or rhonchi, good excursion bilaterally Heart regular rate and rhythm, no murmur appreciated Abd soft, nontender, positive bowel sounds, no liver or spleen tip palpated on exam, no fluid wave  MSK no focal spinal tenderness, no joint edema Neuro: non-focal, well-oriented, appropriate affect Breasts: Deferred   Lab Results  Component Value Date   WBC 9.7 04/23/2018   HGB 12.9 06/11/2018   HCT 38.0 06/11/2018   MCV 104.2 (H) 04/23/2018   PLT 514 (H) 04/23/2018   Lab Results  Component Value Date   FERRITIN 244 04/23/2018   IRON 38 (L) 04/23/2018   TIBC 283 04/23/2018   UIBC 244 04/23/2018   IRONPCTSAT 14 (L) 04/23/2018   Lab Results  Component Value Date   RETICCTPCT 8.3 (H) 04/23/2018   RBC 3.08 (L) 04/23/2018   RBC 3.08 (L) 04/23/2018   RETICCTABS 88.8 12/21/2014   No results found for: KPAFRELGTCHN, LAMBDASER, KAPLAMBRATIO No results found for: Kandis Cocking, IGMSERUM No results found for: Odetta Pink, SPEI   Chemistry      Component Value Date/Time   NA 139 06/11/2018 0607   NA 141 03/12/2017 0929   K 3.2 (L) 06/11/2018 0607   K 2.8 (LL) 03/12/2017 0929   CL 106 04/23/2018 0823   CL 106 12/08/2011 1145   CO2 28 04/23/2018 0823   CO2 27 03/12/2017 0929   BUN 11 04/23/2018 0823   BUN 9.4 03/12/2017 0929   CREATININE 1.49 (H) 04/23/2018 0823   CREATININE 1.2 (H) 03/12/2017 0929      Component Value Date/Time   CALCIUM 9.1 04/23/2018 0823   CALCIUM 9.5 03/12/2017 0929   ALKPHOS 115 04/23/2018 0823   ALKPHOS 125 03/12/2017 0929   AST 15 04/23/2018 0823   AST 13 03/12/2017 0929   ALT 9 04/23/2018 0823   ALT 9 03/12/2017 0929   BILITOT <0.2 (L) 04/23/2018 0823   BILITOT <0.22 03/12/2017 0929       Impression and Plan: Kari Hahn is a very pleasant 48 yo caucasian female with iron deficiency anemia due to  GI blood loss with Crohn's and heavy menstrual bleeding.  She is symptomatic with fatigue and feels that she may need IV iron. We will see what her iron studies show and bring her back in for infusion if needed.  She will continue her same regimen with Xarelto 20 mg PO daily. She will also try wearing compression stockings for added support.  We will plan to see her back in another 6 weeks for follow-up.  She will contact our office with any questions or concerns. We can certainly see her  sooner if need be.   [name], NP 2/26/20208:49 AM

## 2018-08-21 NOTE — Telephone Encounter (Signed)
Called and spoke with patient regarding appointments for IRON infusion being added to her schedule.  She is ok with dates/times per 2/26 sch msg

## 2018-08-26 ENCOUNTER — Ambulatory Visit: Payer: BLUE CROSS/BLUE SHIELD

## 2018-08-28 ENCOUNTER — Inpatient Hospital Stay: Payer: BLUE CROSS/BLUE SHIELD | Attending: Hematology & Oncology

## 2018-08-28 VITALS — BP 126/73 | HR 80 | Temp 97.8°F | Resp 18

## 2018-08-28 DIAGNOSIS — N920 Excessive and frequent menstruation with regular cycle: Secondary | ICD-10-CM | POA: Insufficient documentation

## 2018-08-28 DIAGNOSIS — D510 Vitamin B12 deficiency anemia due to intrinsic factor deficiency: Secondary | ICD-10-CM

## 2018-08-28 DIAGNOSIS — D500 Iron deficiency anemia secondary to blood loss (chronic): Secondary | ICD-10-CM | POA: Diagnosis present

## 2018-08-28 DIAGNOSIS — D509 Iron deficiency anemia, unspecified: Secondary | ICD-10-CM

## 2018-08-28 DIAGNOSIS — K50911 Crohn's disease, unspecified, with rectal bleeding: Secondary | ICD-10-CM | POA: Insufficient documentation

## 2018-08-28 MED ORDER — SODIUM CHLORIDE 0.9 % IV SOLN
Freq: Once | INTRAVENOUS | Status: AC
  Administered 2018-08-28: 09:00:00 via INTRAVENOUS
  Filled 2018-08-28: qty 250

## 2018-08-28 MED ORDER — SODIUM CHLORIDE 0.9 % IV SOLN
510.0000 mg | Freq: Once | INTRAVENOUS | Status: AC
  Administered 2018-08-28: 510 mg via INTRAVENOUS
  Filled 2018-08-28: qty 17

## 2018-08-28 NOTE — Patient Instructions (Signed)

## 2018-08-28 NOTE — Progress Notes (Signed)
Patient did not want to stay for 30 min observation period.  Denies any problems.  VSS

## 2018-09-02 ENCOUNTER — Inpatient Hospital Stay: Payer: BLUE CROSS/BLUE SHIELD

## 2018-09-02 VITALS — BP 112/59 | HR 82 | Temp 98.9°F | Resp 20

## 2018-09-02 DIAGNOSIS — D500 Iron deficiency anemia secondary to blood loss (chronic): Secondary | ICD-10-CM | POA: Diagnosis not present

## 2018-09-02 DIAGNOSIS — D509 Iron deficiency anemia, unspecified: Secondary | ICD-10-CM

## 2018-09-02 MED ORDER — SODIUM CHLORIDE 0.9 % IV SOLN
510.0000 mg | Freq: Once | INTRAVENOUS | Status: AC
  Administered 2018-09-02: 510 mg via INTRAVENOUS
  Filled 2018-09-02: qty 17

## 2018-09-02 MED ORDER — SODIUM CHLORIDE 0.9 % IV SOLN
INTRAVENOUS | Status: DC
  Administered 2018-09-02: 09:00:00 via INTRAVENOUS
  Filled 2018-09-02: qty 250

## 2018-09-02 NOTE — Patient Instructions (Signed)

## 2018-10-02 ENCOUNTER — Inpatient Hospital Stay: Payer: BLUE CROSS/BLUE SHIELD | Admitting: Hematology & Oncology

## 2018-10-02 ENCOUNTER — Inpatient Hospital Stay: Payer: BLUE CROSS/BLUE SHIELD

## 2018-10-11 ENCOUNTER — Telehealth: Payer: Self-pay | Admitting: *Deleted

## 2018-10-11 ENCOUNTER — Other Ambulatory Visit: Payer: Self-pay

## 2018-10-11 ENCOUNTER — Inpatient Hospital Stay (HOSPITAL_BASED_OUTPATIENT_CLINIC_OR_DEPARTMENT_OTHER): Payer: BLUE CROSS/BLUE SHIELD | Admitting: Family

## 2018-10-11 ENCOUNTER — Inpatient Hospital Stay: Payer: BLUE CROSS/BLUE SHIELD | Attending: Hematology & Oncology

## 2018-10-11 ENCOUNTER — Encounter: Payer: Self-pay | Admitting: Family

## 2018-10-11 VITALS — BP 125/52 | HR 88 | Temp 98.5°F | Resp 18

## 2018-10-11 DIAGNOSIS — Z7901 Long term (current) use of anticoagulants: Secondary | ICD-10-CM

## 2018-10-11 DIAGNOSIS — F17210 Nicotine dependence, cigarettes, uncomplicated: Secondary | ICD-10-CM

## 2018-10-11 DIAGNOSIS — K50911 Crohn's disease, unspecified, with rectal bleeding: Secondary | ICD-10-CM | POA: Insufficient documentation

## 2018-10-11 DIAGNOSIS — I82431 Acute embolism and thrombosis of right popliteal vein: Secondary | ICD-10-CM | POA: Diagnosis not present

## 2018-10-11 DIAGNOSIS — N920 Excessive and frequent menstruation with regular cycle: Secondary | ICD-10-CM | POA: Diagnosis not present

## 2018-10-11 DIAGNOSIS — D500 Iron deficiency anemia secondary to blood loss (chronic): Secondary | ICD-10-CM | POA: Diagnosis present

## 2018-10-11 DIAGNOSIS — I824Z1 Acute embolism and thrombosis of unspecified deep veins of right distal lower extremity: Secondary | ICD-10-CM

## 2018-10-11 DIAGNOSIS — I749 Embolism and thrombosis of unspecified artery: Secondary | ICD-10-CM

## 2018-10-11 DIAGNOSIS — D510 Vitamin B12 deficiency anemia due to intrinsic factor deficiency: Secondary | ICD-10-CM | POA: Insufficient documentation

## 2018-10-11 LAB — CMP (CANCER CENTER ONLY)
ALT: 13 U/L (ref 0–44)
AST: 14 U/L — ABNORMAL LOW (ref 15–41)
Albumin: 3.9 g/dL (ref 3.5–5.0)
Alkaline Phosphatase: 88 U/L (ref 38–126)
Anion gap: 7 (ref 5–15)
BUN: 13 mg/dL (ref 6–20)
CO2: 26 mmol/L (ref 22–32)
Calcium: 9.6 mg/dL (ref 8.9–10.3)
Chloride: 98 mmol/L (ref 98–111)
Creatinine: 1.58 mg/dL — ABNORMAL HIGH (ref 0.44–1.00)
GFR, Est AFR Am: 44 mL/min — ABNORMAL LOW (ref >60)
GFR, Estimated: 38 mL/min — ABNORMAL LOW (ref >60)
Glucose, Bld: 112 mg/dL — ABNORMAL HIGH (ref 70–99)
Potassium: 3.9 mmol/L (ref 3.5–5.1)
Sodium: 131 mmol/L — ABNORMAL LOW (ref 135–145)
Total Bilirubin: 0.2 mg/dL — ABNORMAL LOW (ref 0.3–1.2)
Total Protein: 6.4 g/dL — ABNORMAL LOW (ref 6.5–8.1)

## 2018-10-11 LAB — CBC WITH DIFFERENTIAL (CANCER CENTER ONLY)
Abs Immature Granulocytes: 0.66 10*3/uL — ABNORMAL HIGH (ref 0.00–0.07)
Basophils Absolute: 0.1 10*3/uL (ref 0.0–0.1)
Basophils Relative: 1 %
Eosinophils Absolute: 0.3 10*3/uL (ref 0.0–0.5)
Eosinophils Relative: 2 %
HCT: 28 % — ABNORMAL LOW (ref 36.0–46.0)
Hemoglobin: 8.7 g/dL — ABNORMAL LOW (ref 12.0–15.0)
Immature Granulocytes: 4 %
Lymphocytes Relative: 22 %
Lymphs Abs: 3.5 10*3/uL (ref 0.7–4.0)
MCH: 32 pg (ref 26.0–34.0)
MCHC: 31.1 g/dL (ref 30.0–36.0)
MCV: 102.9 fL — ABNORMAL HIGH (ref 80.0–100.0)
Monocytes Absolute: 1.4 10*3/uL — ABNORMAL HIGH (ref 0.1–1.0)
Monocytes Relative: 9 %
Neutro Abs: 9.9 10*3/uL — ABNORMAL HIGH (ref 1.7–7.7)
Neutrophils Relative %: 62 %
Platelet Count: 678 10*3/uL — ABNORMAL HIGH (ref 150–400)
RBC: 2.72 MIL/uL — ABNORMAL LOW (ref 3.87–5.11)
RDW: 23.9 % — ABNORMAL HIGH (ref 11.5–15.5)
WBC Count: 15.9 10*3/uL — ABNORMAL HIGH (ref 4.0–10.5)
nRBC: 0.9 % — ABNORMAL HIGH (ref 0.0–0.2)

## 2018-10-11 LAB — RETICULOCYTES
Immature Retic Fract: 40.1 % — ABNORMAL HIGH (ref 2.3–15.9)
RBC.: 2.75 MIL/uL — ABNORMAL LOW (ref 3.87–5.11)
Retic Count, Absolute: 161.4 10*3/uL (ref 19.0–186.0)
Retic Ct Pct: 5.9 % — ABNORMAL HIGH (ref 0.4–3.1)

## 2018-10-11 LAB — VITAMIN B12: Vitamin B-12: 345 pg/mL (ref 180–914)

## 2018-10-11 MED ORDER — RIVAROXABAN 10 MG PO TABS: 10.0000 mg | ORAL_TABLET | Freq: Every day | ORAL | 5 refills | Status: DC

## 2018-10-11 NOTE — Progress Notes (Signed)
Hematology and Oncology Follow Up Visit  Kari Hahn [id number] 07-15-1970 48 y.o. 10/11/2018   Principle Diagnosis:  Right lower extremity DVT Idiopathic aortic thrombosis Pernicious anemia Iron deficiency anemia Crohn's disease  Current Therapy:   Xarelto 20 mg PO daily Vitamin B12 PO BID daily IV iron as needed   Interim History:  Kari Hahn is here today for follow-up. She is symptomatic with weakness and fatigue.  Her cycles has been quite heavy and lasting for two weeks at a time.  Hgb is down to 8.7, MCV 102.  No other bleeding, no bruising or petechiae. She is currently on Xarelto 20 mg PO daily.  Both she and her husband were sick with a stomach virus last week. She tested negative for Covid-19.  She is smoking 1-2 packs of cigarettes a day.  No fever, chills, n/v, cough, dizziness, SOB, chest pain, palpitations, abdominal pain or changes in bowel or bladder habits.  She has a patch of psoriasis on the left heel. She plans to see a dermatologist near her home once the office Covid-19 restrictions have been lifted.  No swelling or tenderness in her extremities at this time.  No lymphadenopathy noted on exam.  She has maintained a good appetite and is staying well hydrated. Her weight is stable.   ECOG Performance Status: 1 - Symptomatic but completely ambulatory  Medications:  Allergies as of 10/11/2018      Reactions   Eggs Or Egg-derived Products Nausea And Vomiting   When eats too much   Ibuprofen Other (See Comments)   stomache upset      Medication List       Accurate as of October 11, 2018  2:34 PM. Always use your most recent med list.        desmopressin 0.2 MG tablet Commonly known as:  DDAVP 09/02/18-BID per pt   Exforge 5-160 MG tablet Generic drug:  amLODipine-valsartan 1 TABLET BY MOUTH DAILY FOR BLOOD PRESSURE--- takes in am after eaten   fluticasone 50 MCG/ACT nasal spray Commonly known as:  FLONASE daily as needed.   folic acid 1 MG  tablet Commonly known as:  FOLVITE Take 1 mg by mouth daily.   gabapentin 800 MG tablet Commonly known as:  NEURONTIN Take 800 mg by mouth 3 (three) times daily.   Humira Pen 40 MG/0.4ML Pnkt Generic drug:  Adalimumab Every 2 weeks   Lexapro 20 MG tablet Generic drug:  escitalopram Take 40 mg by mouth at bedtime. 2  20 mg tab = 40 mg daily   Macrobid 100 MG capsule Generic drug:  nitrofurantoin (macrocrystal-monohydrate) Take 100 mg by mouth daily. Started on 05-13-18   ondansetron 8 MG tablet Commonly known as:  ZOFRAN TAKE 1 TABLET BY MOUTH 2 TIMES A DAY   oxyCODONE-acetaminophen 10-325 MG tablet Commonly known as:  PERCOCET Take 1 tablet by mouth 4 (four) times daily.   pantoprazole 40 MG tablet Commonly known as:  PROTONIX Take 40 mg by mouth daily.   potassium chloride SA 20 MEQ tablet Commonly known as:  K-DUR Take 20 mEq by mouth every evening.   promethazine 25 MG tablet Commonly known as:  PHENERGAN Take 25 mg by mouth every 6 (six) hours as needed.   rivaroxaban 20 MG Tabs tablet Commonly known as:  Xarelto Take 1 tablet (20 mg total) by mouth daily with supper.   VITAMIN B 12 PO Take by mouth daily.   vitamin C 1000 MG tablet Take 1,000 mg by mouth  daily.   Vitamin D3 125 MCG (5000 UT) Tabs Take by mouth every morning.   zinc gluconate 50 MG tablet Take 50 mg by mouth daily.       Allergies:  Allergies  Allergen Reactions  . Eggs Or Egg-Derived Products Nausea And Vomiting    When eats too much  . Ibuprofen Other (See Comments)    stomache upset    Past Medical History, Surgical history, Social history, and Family History were reviewed and updated.  Review of Systems: All other 10 point review of systems is negative.   Physical Exam:  vitals were not taken for this visit.   Wt Readings from Last 3 Encounters:  08/21/18 173 lb 12 oz (78.8 kg)  06/11/18 165 lb 8 oz (75.1 kg)  04/23/18 165 lb (74.8 kg)    Ocular: Sclerae  unicteric, pupils equal, round and reactive to light Ear-nose-throat: Oropharynx clear, dentition fair Lymphatic: No cervical or supraclavicular adenopathy Lungs no rales or rhonchi, good excursion bilaterally Heart regular rate and rhythm, no murmur appreciated Abd soft, nontender, positive bowel sounds, no liver or spleen tip palpated on exam, no fluid wave  MSK no focal spinal tenderness, no joint edema Neuro: non-focal, well-oriented, appropriate affect Breasts: Deferred   Lab Results  Component Value Date   WBC 15.9 (H) 10/11/2018   HGB 8.7 (L) 10/11/2018   HCT 28.0 (L) 10/11/2018   MCV 102.9 (H) 10/11/2018   PLT 678 (H) 10/11/2018   Lab Results  Component Value Date   FERRITIN 16 08/21/2018   IRON 27 (L) 08/21/2018   TIBC 521 (H) 08/21/2018   UIBC 495 (H) 08/21/2018   IRONPCTSAT 5 (L) 08/21/2018   Lab Results  Component Value Date   RETICCTPCT 5.9 (H) 10/11/2018   RBC 2.75 (L) 10/11/2018   RBC 2.72 (L) 10/11/2018   RETICCTABS 88.8 12/21/2014   No results found for: KPAFRELGTCHN, LAMBDASER, KAPLAMBRATIO No results found for: IGGSERUM, IGA, IGMSERUM No results found for: Odetta Pink, SPEI   Chemistry      Component Value Date/Time   NA 142 08/21/2018 0840   NA 141 03/12/2017 0929   K 3.3 (L) 08/21/2018 0840   K 2.8 (LL) 03/12/2017 0929   CL 98 08/21/2018 0840   CL 106 12/08/2011 1145   CO2 32 08/21/2018 0840   CO2 27 03/12/2017 0929   BUN 14 08/21/2018 0840   BUN 9.4 03/12/2017 0929   CREATININE 1.44 (H) 08/21/2018 0840   CREATININE 1.2 (H) 03/12/2017 0929      Component Value Date/Time   CALCIUM 10.5 (H) 08/21/2018 0840   CALCIUM 9.5 03/12/2017 0929   ALKPHOS 112 08/21/2018 0840   ALKPHOS 125 03/12/2017 0929   AST 26 08/21/2018 0840   AST 13 03/12/2017 0929   ALT 22 08/21/2018 0840   ALT 9 03/12/2017 0929   BILITOT 0.3 08/21/2018 0840   BILITOT <0.22 03/12/2017 0929       Impression and  Plan: Kari Hahn is a very pleasant 48 yo caucasian female with iron deficiency anemia due to GI blood loss with Crohn's and heavy menstrual bleeding.  She is symptomatic with fatigue and weakness. Hgb is down to 8.7 We will plan to give her 2 doses of IV iron.  She will have a repeat US of the right lower extremity within the next week or so.  I spoke with Dr. Marin Olp and we will reduce her Xarelto dose to 10 mg PO daily and  see if this helps lessen the heaviness of her cycle.  We will plan to see her back in another 6 weeks for follow-up.  She will contact our office with any questions or concerns. We can certainly see her sooner if need be.   [name], NP 4/17/20202:34 PM

## 2018-10-11 NOTE — Telephone Encounter (Signed)
Error in charting.

## 2018-10-14 ENCOUNTER — Other Ambulatory Visit: Payer: Self-pay | Admitting: Family

## 2018-10-14 ENCOUNTER — Other Ambulatory Visit: Payer: Self-pay

## 2018-10-14 ENCOUNTER — Inpatient Hospital Stay: Payer: BLUE CROSS/BLUE SHIELD

## 2018-10-14 VITALS — BP 127/56 | HR 98 | Temp 98.2°F | Resp 18

## 2018-10-14 DIAGNOSIS — D500 Iron deficiency anemia secondary to blood loss (chronic): Secondary | ICD-10-CM

## 2018-10-14 DIAGNOSIS — I749 Embolism and thrombosis of unspecified artery: Secondary | ICD-10-CM

## 2018-10-14 DIAGNOSIS — D510 Vitamin B12 deficiency anemia due to intrinsic factor deficiency: Secondary | ICD-10-CM

## 2018-10-14 DIAGNOSIS — D509 Iron deficiency anemia, unspecified: Secondary | ICD-10-CM

## 2018-10-14 LAB — IRON AND TIBC
Iron: 29 ug/dL — ABNORMAL LOW (ref 41–142)
Saturation Ratios: 8 % — ABNORMAL LOW (ref 21–57)
TIBC: 360 ug/dL (ref 236–444)
UIBC: 331 ug/dL (ref 120–384)

## 2018-10-14 LAB — FERRITIN: Ferritin: 67 ng/mL (ref 11–307)

## 2018-10-14 MED ORDER — SODIUM CHLORIDE 0.9 % IV SOLN
Freq: Once | INTRAVENOUS | Status: AC
  Administered 2018-10-14: 09:00:00 via INTRAVENOUS
  Filled 2018-10-14: qty 250

## 2018-10-14 MED ORDER — SODIUM CHLORIDE 0.9 % IV SOLN
510.0000 mg | Freq: Once | INTRAVENOUS | Status: AC
  Administered 2018-10-14: 510 mg via INTRAVENOUS
  Filled 2018-10-14: qty 17

## 2018-10-14 MED ORDER — CYANOCOBALAMIN 1000 MCG/ML IJ SOLN
1000.0000 ug | Freq: Once | INTRAMUSCULAR | Status: AC
  Administered 2018-10-14: 10:00:00 1000 ug via INTRAMUSCULAR

## 2018-10-14 MED ORDER — RIVAROXABAN 10 MG PO TABS: 10.0000 mg | ORAL_TABLET | Freq: Every day | ORAL | 5 refills | Status: DC

## 2018-10-14 MED ORDER — CYANOCOBALAMIN 1000 MCG/ML IJ SOLN
INTRAMUSCULAR | Status: AC
  Filled 2018-10-14: qty 1

## 2018-10-14 NOTE — Patient Instructions (Signed)
\  [id number]\\[id number]\Ferumoxytol injection What is this medicine? FERUMOXYTOL is an iron complex. Iron is used to make healthy red blood cells, which carry oxygen and nutrients throughout the body. This medicine is used to treat iron deficiency anemia. This medicine may be used for other purposes; ask your health care provider or pharmacist if you have questions. COMMON BRAND NAME(S): Feraheme What should I tell my health care provider before I take this medicine? They need to know if you have any of these conditions: -anemia not caused by low iron levels -high levels of iron in the blood -magnetic resonance imaging (MRI) test scheduled -an unusual or allergic reaction to iron, other medicines, foods, dyes, or preservatives -pregnant or trying to get pregnant -breast-feeding How should I use this medicine? This medicine is for injection into a vein. It is given by a health care professional in a hospital or clinic setting. Talk to your pediatrician regarding the use of this medicine in children. Special care may be needed. Overdosage: If you think you have taken too much of this medicine contact a poison control center or emergency room at once. NOTE: This medicine is only for you. Do not share this medicine with others. What if I miss a dose? It is important not to miss your dose. Call your doctor or health care professional if you are unable to keep an appointment. What may interact with this medicine? This medicine may interact with the following medications: -other iron products This list may not describe all possible interactions. Give your health care provider a list of all the medicines, herbs, non-prescription drugs, or dietary supplements you use. Also tell them if you smoke, drink alcohol, or use illegal drugs. Some items may interact with your medicine. What should I watch for while using this medicine? Visit your doctor or healthcare professional regularly. Tell your  doctor or healthcare professional if your symptoms do not start to get better or if they get worse. You may need blood work done while you are taking this medicine. You may need to follow a special diet. Talk to your doctor. Foods that contain iron include: whole grains/cereals, dried fruits, beans, or peas, leafy green vegetables, and organ meats (liver, kidney). What side effects may I notice from receiving this medicine? Side effects that you should report to your doctor or health care professional as soon as possible: -allergic reactions like skin rash, itching or hives, swelling of the face, lips, or tongue -breathing problems -changes in blood pressure -feeling faint or lightheaded, falls -fever or chills -flushing, sweating, or hot feelings -swelling of the ankles or feet Side effects that usually do not require medical attention (report to your doctor or health care professional if they continue or are bothersome): -diarrhea -headache -nausea, vomiting -stomach pain This list may not describe all possible side effects. Call your doctor for medical advice about side effects. You may report side effects to FDA at 1-800-FDA-1088. Where should I keep my medicine? This drug is given in a hospital or clinic and will not be stored at home. NOTE: This sheet is a summary. It may not cover all possible information. If you have questions about this medicine, talk to your doctor, pharmacist, or health care provider.  2019 Elsevier/Gold Standard (2016-07-31 20:21:10)

## 2018-10-21 ENCOUNTER — Inpatient Hospital Stay: Payer: BLUE CROSS/BLUE SHIELD

## 2018-10-21 ENCOUNTER — Other Ambulatory Visit: Payer: Self-pay

## 2018-10-21 VITALS — BP 100/54 | HR 98 | Temp 98.2°F | Resp 18

## 2018-10-21 DIAGNOSIS — D510 Vitamin B12 deficiency anemia due to intrinsic factor deficiency: Secondary | ICD-10-CM

## 2018-10-21 DIAGNOSIS — D509 Iron deficiency anemia, unspecified: Secondary | ICD-10-CM

## 2018-10-21 DIAGNOSIS — D500 Iron deficiency anemia secondary to blood loss (chronic): Secondary | ICD-10-CM

## 2018-10-21 MED ORDER — SODIUM CHLORIDE 0.9 % IV SOLN
Freq: Once | INTRAVENOUS | Status: AC
  Administered 2018-10-21: 15:00:00 via INTRAVENOUS
  Filled 2018-10-21: qty 250

## 2018-10-21 MED ORDER — SODIUM CHLORIDE 0.9 % IV SOLN
510.0000 mg | Freq: Once | INTRAVENOUS | Status: AC
  Administered 2018-10-21: 510 mg via INTRAVENOUS
  Filled 2018-10-21: qty 17

## 2018-10-21 NOTE — Progress Notes (Signed)
Spoke with Kari Hahn who arrived late for her iron infusion appointment today. We are happy to work her into the schedule, but asked that she please be on time for future appointments.

## 2018-10-21 NOTE — Patient Instructions (Signed)

## 2018-10-24 ENCOUNTER — Telehealth: Payer: Self-pay | Admitting: Hematology & Oncology

## 2018-10-24 NOTE — Telephone Encounter (Signed)
Appointments scheduled letter/calendar mailed

## 2018-10-29 ENCOUNTER — Ambulatory Visit (HOSPITAL_BASED_OUTPATIENT_CLINIC_OR_DEPARTMENT_OTHER): Admission: RE | Admit: 2018-10-29 | Payer: BLUE CROSS/BLUE SHIELD | Source: Ambulatory Visit

## 2018-10-31 ENCOUNTER — Ambulatory Visit (HOSPITAL_BASED_OUTPATIENT_CLINIC_OR_DEPARTMENT_OTHER)
Admission: RE | Admit: 2018-10-31 | Discharge: 2018-10-31 | Disposition: A | Discharge status: Home / Self Care | Payer: BLUE CROSS/BLUE SHIELD | Source: Ambulatory Visit | Attending: Family | Admitting: Family

## 2018-10-31 ENCOUNTER — Other Ambulatory Visit: Payer: Self-pay

## 2018-10-31 DIAGNOSIS — I82431 Acute embolism and thrombosis of right popliteal vein: Secondary | ICD-10-CM | POA: Insufficient documentation

## 2018-10-31 DIAGNOSIS — I749 Embolism and thrombosis of unspecified artery: Secondary | ICD-10-CM | POA: Diagnosis present

## 2018-11-06 ENCOUNTER — Telehealth: Payer: Self-pay | Admitting: Family

## 2018-11-06 NOTE — Telephone Encounter (Signed)
Appts rescheduled due to provider [employer]/letter/calendar mailed

## 2018-11-19 ENCOUNTER — Ambulatory Visit: Payer: BLUE CROSS/BLUE SHIELD | Admitting: Family

## 2018-11-19 ENCOUNTER — Other Ambulatory Visit: Payer: BLUE CROSS/BLUE SHIELD

## 2018-11-19 ENCOUNTER — Ambulatory Visit: Payer: BLUE CROSS/BLUE SHIELD

## 2018-11-20 ENCOUNTER — Inpatient Hospital Stay: Payer: BLUE CROSS/BLUE SHIELD | Admitting: Family

## 2018-11-20 ENCOUNTER — Inpatient Hospital Stay: Payer: BLUE CROSS/BLUE SHIELD

## 2018-11-22 ENCOUNTER — Ambulatory Visit: Payer: BLUE CROSS/BLUE SHIELD | Admitting: Family

## 2018-11-22 ENCOUNTER — Ambulatory Visit: Payer: BLUE CROSS/BLUE SHIELD

## 2018-11-22 ENCOUNTER — Other Ambulatory Visit: Payer: BLUE CROSS/BLUE SHIELD

## 2018-11-23 DIAGNOSIS — Z86718 Personal history of other venous thrombosis and embolism: Secondary | ICD-10-CM

## 2018-11-23 HISTORY — DX: Personal history of other venous thrombosis and embolism: Z86.718

## 2018-11-25 ENCOUNTER — Telehealth: Payer: Self-pay | Admitting: Family

## 2018-11-25 NOTE — Telephone Encounter (Signed)
Called and pre screened patient for appt / NO COVID symptoms/signs

## 2018-11-26 ENCOUNTER — Inpatient Hospital Stay: Payer: BC Managed Care – PPO | Attending: Hematology & Oncology

## 2018-11-26 ENCOUNTER — Inpatient Hospital Stay: Payer: BC Managed Care – PPO

## 2018-11-26 ENCOUNTER — Inpatient Hospital Stay: Payer: BC Managed Care – PPO | Admitting: Family

## 2018-11-26 ENCOUNTER — Encounter: Payer: Self-pay | Admitting: Family

## 2018-11-26 ENCOUNTER — Inpatient Hospital Stay (HOSPITAL_BASED_OUTPATIENT_CLINIC_OR_DEPARTMENT_OTHER): Payer: BC Managed Care – PPO | Admitting: Family

## 2018-11-26 ENCOUNTER — Other Ambulatory Visit: Payer: Self-pay | Admitting: Urology

## 2018-11-26 ENCOUNTER — Ambulatory Visit: Payer: BLUE CROSS/BLUE SHIELD

## 2018-11-26 ENCOUNTER — Other Ambulatory Visit: Payer: Self-pay

## 2018-11-26 VITALS — BP 145/70 | HR 104 | Temp 98.1°F | Wt 179.2 lb

## 2018-11-26 DIAGNOSIS — D510 Vitamin B12 deficiency anemia due to intrinsic factor deficiency: Secondary | ICD-10-CM | POA: Insufficient documentation

## 2018-11-26 DIAGNOSIS — I82401 Acute embolism and thrombosis of unspecified deep veins of right lower extremity: Secondary | ICD-10-CM | POA: Insufficient documentation

## 2018-11-26 DIAGNOSIS — I82431 Acute embolism and thrombosis of right popliteal vein: Secondary | ICD-10-CM

## 2018-11-26 DIAGNOSIS — R202 Paresthesia of skin: Secondary | ICD-10-CM | POA: Insufficient documentation

## 2018-11-26 DIAGNOSIS — Z7901 Long term (current) use of anticoagulants: Secondary | ICD-10-CM | POA: Diagnosis not present

## 2018-11-26 DIAGNOSIS — I7419 Embolism and thrombosis of other parts of aorta: Secondary | ICD-10-CM | POA: Insufficient documentation

## 2018-11-26 DIAGNOSIS — R002 Palpitations: Secondary | ICD-10-CM | POA: Diagnosis not present

## 2018-11-26 DIAGNOSIS — K50918 Crohn's disease, unspecified, with other complication: Secondary | ICD-10-CM | POA: Diagnosis not present

## 2018-11-26 DIAGNOSIS — N920 Excessive and frequent menstruation with regular cycle: Secondary | ICD-10-CM | POA: Insufficient documentation

## 2018-11-26 DIAGNOSIS — K922 Gastrointestinal hemorrhage, unspecified: Secondary | ICD-10-CM | POA: Insufficient documentation

## 2018-11-26 DIAGNOSIS — K5090 Crohn's disease, unspecified, without complications: Secondary | ICD-10-CM | POA: Insufficient documentation

## 2018-11-26 DIAGNOSIS — Z79899 Other long term (current) drug therapy: Secondary | ICD-10-CM | POA: Insufficient documentation

## 2018-11-26 DIAGNOSIS — R200 Anesthesia of skin: Secondary | ICD-10-CM | POA: Diagnosis not present

## 2018-11-26 DIAGNOSIS — D500 Iron deficiency anemia secondary to blood loss (chronic): Secondary | ICD-10-CM

## 2018-11-26 DIAGNOSIS — I749 Embolism and thrombosis of unspecified artery: Secondary | ICD-10-CM

## 2018-11-26 LAB — CMP (CANCER CENTER ONLY)
ALT: 9 U/L (ref 0–44)
AST: 13 U/L — ABNORMAL LOW (ref 15–41)
Albumin: 3.8 g/dL (ref 3.5–5.0)
Alkaline Phosphatase: 104 U/L (ref 38–126)
Anion gap: 9 (ref 5–15)
BUN: 12 mg/dL (ref 6–20)
CO2: 29 mmol/L (ref 22–32)
Calcium: 8.9 mg/dL (ref 8.9–10.3)
Chloride: 98 mmol/L (ref 98–111)
Creatinine: 1.37 mg/dL — ABNORMAL HIGH (ref 0.44–1.00)
GFR, Est AFR Am: 52 mL/min — ABNORMAL LOW (ref >60)
GFR, Estimated: 45 mL/min — ABNORMAL LOW (ref >60)
Glucose, Bld: 163 mg/dL — ABNORMAL HIGH (ref 70–99)
Potassium: 3.8 mmol/L (ref 3.5–5.1)
Sodium: 136 mmol/L (ref 135–145)
Total Bilirubin: 0.2 mg/dL — ABNORMAL LOW (ref 0.3–1.2)
Total Protein: 6.8 g/dL (ref 6.5–8.1)

## 2018-11-26 LAB — CBC WITH DIFFERENTIAL (CANCER CENTER ONLY)
Abs Immature Granulocytes: 0.1 10*3/uL — ABNORMAL HIGH (ref 0.00–0.07)
Basophils Absolute: 0.1 10*3/uL (ref 0.0–0.1)
Basophils Relative: 1 %
Eosinophils Absolute: 0.3 10*3/uL (ref 0.0–0.5)
Eosinophils Relative: 2 %
HCT: 41.2 % (ref 36.0–46.0)
Hemoglobin: 12.9 g/dL (ref 12.0–15.0)
Immature Granulocytes: 1 %
Lymphocytes Relative: 15 %
Lymphs Abs: 2 10*3/uL (ref 0.7–4.0)
MCH: 31.5 pg (ref 26.0–34.0)
MCHC: 31.3 g/dL (ref 30.0–36.0)
MCV: 100.5 fL — ABNORMAL HIGH (ref 80.0–100.0)
Monocytes Absolute: 0.7 10*3/uL (ref 0.1–1.0)
Monocytes Relative: 5 %
Neutro Abs: 10.3 10*3/uL — ABNORMAL HIGH (ref 1.7–7.7)
Neutrophils Relative %: 76 %
Platelet Count: 552 10*3/uL — ABNORMAL HIGH (ref 150–400)
RBC: 4.1 MIL/uL (ref 3.87–5.11)
RDW: 15.6 % — ABNORMAL HIGH (ref 11.5–15.5)
WBC Count: 13.6 10*3/uL — ABNORMAL HIGH (ref 4.0–10.5)
nRBC: 0 % (ref 0.0–0.2)

## 2018-11-26 LAB — RETICULOCYTES
Immature Retic Fract: 30.9 % — ABNORMAL HIGH (ref 2.3–15.9)
RBC.: 4.05 MIL/uL (ref 3.87–5.11)
Retic Count, Absolute: 56.3 10*3/uL (ref 19.0–186.0)
Retic Ct Pct: 1.4 % (ref 0.4–3.1)

## 2018-11-26 LAB — VITAMIN B12: Vitamin B-12: 470 pg/mL (ref 180–914)

## 2018-11-26 LAB — SAVE SMEAR(SSMR), FOR PROVIDER SLIDE REVIEW

## 2018-11-26 MED ORDER — ONABOTULINUMTOXINA 100 UNITS IJ SOLR: 100.0000 [IU] | Freq: Once | INTRAMUSCULAR | Status: AC

## 2018-11-26 NOTE — Progress Notes (Signed)
Hematology and Oncology Follow Up Visit  Kari Hahn [id number] 08/27/1970 48 y.o. 11/26/2018   Principle Diagnosis:  Right lower extremity DVT Idiopathic aortic thrombosis Pernicious anemia Iron deficiency anemia Crohn's disease  Current Therapy:   Xarelto 20 mg PO daily Vitamin B12 PO BID daily IV iron as needed   Interim History:  Kari Hahn is here today for follow-up. She is doing well but has had some stress at home with her new puppy. This has led to a mild flare with her Crohn's. She has also noted a few palpitations when stressed.  Hgb is stable 12.9, MCV 100 and platelet count 552. Iron studies pending.  Her cycle stopped with a course of Provera in April.  No new episodes of bleeding. No bruising or petechiae.  No fever, chills, n/v, cough, rash, dizziness, SOB, chest pain, palpitations, abdominal pain or changes in bowel or bladder habits.  No lymphadenopathy noted on exam.  No swelling or tenderness in her extremities.  The numbness and tingling is still present in her hands and feet.  No lymphadenopathy noted on exam.  She has maintained a good appetite and is staying well hydrated. Her weight is stable.   ECOG Performance Status: 1 - Symptomatic but completely ambulatory  Medications:  Allergies as of 11/26/2018      Reactions   Eggs Or Egg-derived Products Nausea And Vomiting   When eats too much   Ibuprofen Other (See Comments)   stomache upset      Medication List       Accurate as of November 26, 2018 11:57 AM. If you have any questions, ask your nurse or doctor.        desmopressin 0.2 MG tablet Commonly known as:  DDAVP 09/02/18-BID per pt   Exforge 5-160 MG tablet Generic drug:  amLODipine-valsartan 1 TABLET BY MOUTH DAILY FOR BLOOD PRESSURE--- takes in am after eaten   fluticasone 50 MCG/ACT nasal spray Commonly known as:  FLONASE daily as needed.   folic acid 1 MG tablet Commonly known as:  FOLVITE Take 1 mg by mouth daily.    gabapentin 800 MG tablet Commonly known as:  NEURONTIN Take 800 mg by mouth 3 (three) times daily.   Humira Pen 40 MG/0.4ML Pnkt Generic drug:  Adalimumab Every 2 weeks   levofloxacin 500 MG tablet Commonly known as:  LEVAQUIN   Lexapro 20 MG tablet Generic drug:  escitalopram Take 40 mg by mouth at bedtime. 2  20 mg tab = 40 mg daily   Macrobid 100 MG capsule Generic drug:  nitrofurantoin (macrocrystal-monohydrate) Take 100 mg by mouth daily. Started on 05-13-18   medroxyPROGESTERone 10 MG tablet Commonly known as:  PROVERA Take by mouth.   medroxyPROGESTERone 10 MG tablet Commonly known as:  PROVERA   ondansetron 8 MG tablet Commonly known as:  ZOFRAN TAKE 1 TABLET BY MOUTH 2 TIMES A DAY   oxyCODONE-acetaminophen 10-325 MG tablet Commonly known as:  PERCOCET Take 1 tablet by mouth 4 (four) times daily.   pantoprazole 40 MG tablet Commonly known as:  PROTONIX Take 40 mg by mouth daily.   potassium chloride SA 20 MEQ tablet Commonly known as:  K-DUR Take 20 mEq by mouth every evening.   promethazine 25 MG tablet Commonly known as:  PHENERGAN Take 25 mg by mouth every 6 (six) hours as needed.   rivaroxaban 10 MG Tabs tablet Commonly known as:  XARELTO Take 1 tablet (10 mg total) by mouth daily with supper.  VITAMIN B 12 PO Take by mouth daily.   vitamin C 1000 MG tablet Take 1,000 mg by mouth daily.   Vitamin D3 125 MCG (5000 UT) Tabs Take by mouth every morning.   zinc gluconate 50 MG tablet Take 50 mg by mouth daily.       Allergies:  Allergies  Allergen Reactions  . Eggs Or Egg-Derived Products Nausea And Vomiting    When eats too much  . Ibuprofen Other (See Comments)    stomache upset    Past Medical History, Surgical history, Social history, and Family History were reviewed and updated.  Review of Systems: All other 10 point review of systems is negative.   Physical Exam:  vitals were not taken for this visit.   Wt Readings from  Last 3 Encounters:  08/21/18 173 lb 12 oz (78.8 kg)  06/11/18 165 lb 8 oz (75.1 kg)  04/23/18 165 lb (74.8 kg)    Ocular: Sclerae unicteric, pupils equal, round and reactive to light Ear-nose-throat: Oropharynx clear, dentition fair Lymphatic: No cervical or supraclavicular adenopathy Lungs no rales or rhonchi, good excursion bilaterally Heart regular rate and rhythm, no murmur appreciated Abd soft, nontender, positive bowel sounds, no liver or spleen tip palpated on exam, no fluid wave  MSK no focal spinal tenderness, no joint edema Neuro: non-focal, well-oriented, appropriate affect Breasts: Deferred   Lab Results  Component Value Date   WBC 13.6 (H) 11/26/2018   HGB 12.9 11/26/2018   HCT 41.2 11/26/2018   MCV 100.5 (H) 11/26/2018   PLT 552 (H) 11/26/2018   Lab Results  Component Value Date   FERRITIN 67 10/11/2018   IRON 29 (L) 10/11/2018   TIBC 360 10/11/2018   UIBC 331 10/11/2018   IRONPCTSAT 8 (L) 10/11/2018   Lab Results  Component Value Date   RETICCTPCT 5.9 (H) 10/11/2018   RBC 4.10 11/26/2018   RETICCTABS 88.8 12/21/2014   No results found for: KPAFRELGTCHN, LAMBDASER, KAPLAMBRATIO No results found for: IGGSERUM, IGA, IGMSERUM No results found for: Kathrynn Ducking, MSPIKE, SPEI   Chemistry      Component Value Date/Time   NA 131 (L) 10/11/2018 1407   NA 141 03/12/2017 0929   K 3.9 10/11/2018 1407   K 2.8 (LL) 03/12/2017 0929   CL 98 10/11/2018 1407   CL 106 12/08/2011 1145   CO2 26 10/11/2018 1407   CO2 27 03/12/2017 0929   BUN 13 10/11/2018 1407   BUN 9.4 03/12/2017 0929   CREATININE 1.58 (H) 10/11/2018 1407   CREATININE 1.2 (H) 03/12/2017 0929      Component Value Date/Time   CALCIUM 9.6 10/11/2018 1407   CALCIUM 9.5 03/12/2017 0929   ALKPHOS 88 10/11/2018 1407   ALKPHOS 125 03/12/2017 0929   AST 14 (L) 10/11/2018 1407   AST 13 03/12/2017 0929   ALT 13 10/11/2018 1407   ALT 9 03/12/2017 0929    BILITOT 0.2 (L) 10/11/2018 1407   BILITOT <0.22 03/12/2017 0929       Impression and Plan: Kari Hahn is a very pleasant 48 yo caucasian female with iron deficiency anemia due to GI blood loss with Crohn's and heavy menstrual bleeding.  We will see what her iron studies show and bring her back in for infusion if need be.  We will plan to see her back in another 6 weeks.  She will contact our office with any questions or concerns. We can certainly see her sooner if need be.  [name], NP 6/2/202011:57 AM

## 2018-11-27 ENCOUNTER — Telehealth: Payer: Self-pay | Admitting: Hematology & Oncology

## 2018-11-27 LAB — IRON AND TIBC
Iron: 30 ug/dL — ABNORMAL LOW (ref 41–142)
Saturation Ratios: 8 % — ABNORMAL LOW (ref 21–57)
TIBC: 365 ug/dL (ref 236–444)
UIBC: 335 ug/dL (ref 120–384)

## 2018-11-27 LAB — FERRITIN: Ferritin: 56 ng/mL (ref 11–307)

## 2018-11-27 NOTE — Telephone Encounter (Signed)
sched iron infusion 6/8 at 0800 per pt 6/3

## 2018-11-29 ENCOUNTER — Other Ambulatory Visit: Payer: Self-pay

## 2018-11-29 ENCOUNTER — Encounter (HOSPITAL_BASED_OUTPATIENT_CLINIC_OR_DEPARTMENT_OTHER): Payer: Self-pay | Admitting: *Deleted

## 2018-11-29 NOTE — Progress Notes (Signed)

## 2018-11-29 NOTE — Progress Notes (Signed)
Spoke w/ pt via phone for pre-op interview.  Npo after mn.  Arrive at [facility].  Need urine preg and ekg.  Current cbc and cmt results dated 11-26-2018 in chart and epic.  Will take gabapentin , protonix, and oxycodone am dos w/ sips of water.  Getting covid test done Monday 12-02-2018 @ 1035.

## 2018-12-02 ENCOUNTER — Ambulatory Visit: Payer: BC Managed Care – PPO

## 2018-12-02 ENCOUNTER — Other Ambulatory Visit: Payer: Self-pay

## 2018-12-02 ENCOUNTER — Other Ambulatory Visit (HOSPITAL_COMMUNITY)
Admission: RE | Admit: 2018-12-02 | Discharge: 2018-12-02 | Disposition: A | Discharge status: Home / Self Care | Payer: BC Managed Care – PPO | Source: Ambulatory Visit | Attending: Urology | Admitting: Urology

## 2018-12-02 DIAGNOSIS — Z1159 Encounter for screening for other viral diseases: Secondary | ICD-10-CM | POA: Diagnosis not present

## 2018-12-02 LAB — SARS CORONAVIRUS 2 BY RT PCR (HOSPITAL ORDER, PERFORMED IN ~~LOC~~ HOSPITAL LAB): SARS Coronavirus 2: NEGATIVE

## 2018-12-03 ENCOUNTER — Encounter (HOSPITAL_BASED_OUTPATIENT_CLINIC_OR_DEPARTMENT_OTHER): Payer: Self-pay | Admitting: Anesthesiology

## 2018-12-03 NOTE — Progress Notes (Signed)
SPOKE W/  _     SCREENING SYMPTOMS OF COVID 19:   COUGH--  RUNNY NOSE---   SORE THROAT---  NASAL CONGESTION----  SNEEZING----  SHORTNESS OF BREATH---  DIFFICULTY BREATHING---  TEMP >100.0 -----  UNEXPLAINED BODY ACHES------  CHILLS --------   HEADACHES ---------  LOSS OF SMELL/ TASTE --------    HAVE YOU OR ANY FAMILY MEMBER TRAVELLED PAST 14 DAYS OUT OF THE   COUNTY--- STATE---- COUNTRY----  HAVE YOU OR ANY FAMILY MEMBER BEEN EXPOSED TO ANYONE WITH COVID 19?   No answer when attempted call. No voice mail associated with [phone]. Call could not be completed when dialed [phone]

## 2018-12-03 NOTE — Anesthesia Preprocedure Evaluation (Addendum)
Anesthesia Evaluation  Patient identified by MRN, date of birth, ID band Patient awake    Reviewed: Allergy & Precautions, NPO status , Patient's Chart, lab work & pertinent test results  History of Anesthesia Complications Negative for: history of anesthetic complications  Airway Mallampati: II  TM Distance: >3 FB Neck ROM: Full    Dental  (+) Edentulous Upper, Edentulous Lower   Pulmonary Current Smoker,    Pulmonary exam normal        Cardiovascular hypertension, Pt. on medications + DVT (RLE, on Xarelto)  Normal cardiovascular exam     Neuro/Psych Anxiety Depression negative neurological ROS     GI/Hepatic Neg liver ROS, GERD  Medicated and Controlled,Crohn's dx   Endo/Other  negative endocrine ROS  Renal/GU Renal InsufficiencyRenal diseaseCr 1.37     Musculoskeletal  (+) Arthritis ,   Abdominal   Peds  Hematology negative hematology ROS (+)   Anesthesia Other Findings Day of surgery medications reviewed with the patient.  Reproductive/Obstetrics                           Anesthesia Physical Anesthesia Plan  ASA: III  Anesthesia Plan: MAC   Post-op Pain Management:    Induction: Intravenous  PONV Risk Score and Plan: 1 and Treatment may vary due to age or medical condition, Propofol infusion, Ondansetron and Midazolam  Airway Management Planned: Natural Airway and Simple Face Mask  Additional Equipment:   Intra-op Plan:   Post-operative Plan:   Informed Consent: I have reviewed the patients History and Physical, chart, labs and discussed the procedure including the risks, benefits and alternatives for the proposed anesthesia with the patient or authorized representative who has indicated his/her understanding and acceptance.     Dental advisory given  Plan Discussed with: CRNA  Anesthesia Plan Comments:        Anesthesia Quick Evaluation

## 2018-12-04 ENCOUNTER — Ambulatory Visit (HOSPITAL_BASED_OUTPATIENT_CLINIC_OR_DEPARTMENT_OTHER)
Admission: RE | Admit: 2018-12-04 | Discharge: 2018-12-04 | Disposition: A | Discharge status: Home / Self Care | Payer: BC Managed Care – PPO | Attending: Urology | Admitting: Urology

## 2018-12-04 ENCOUNTER — Ambulatory Visit (HOSPITAL_BASED_OUTPATIENT_CLINIC_OR_DEPARTMENT_OTHER): Payer: BC Managed Care – PPO | Admitting: Anesthesiology

## 2018-12-04 ENCOUNTER — Encounter (HOSPITAL_BASED_OUTPATIENT_CLINIC_OR_DEPARTMENT_OTHER): Payer: Self-pay | Admitting: *Deleted

## 2018-12-04 ENCOUNTER — Encounter (HOSPITAL_BASED_OUTPATIENT_CLINIC_OR_DEPARTMENT_OTHER)
Admission: RE | Disposition: A | Discharge status: Home / Self Care | Payer: Self-pay | Source: Home / Self Care | Attending: Urology

## 2018-12-04 ENCOUNTER — Other Ambulatory Visit: Payer: Self-pay

## 2018-12-04 DIAGNOSIS — Z886 Allergy status to analgesic agent status: Secondary | ICD-10-CM | POA: Diagnosis not present

## 2018-12-04 DIAGNOSIS — Z91012 Allergy to eggs: Secondary | ICD-10-CM | POA: Insufficient documentation

## 2018-12-04 DIAGNOSIS — Z86718 Personal history of other venous thrombosis and embolism: Secondary | ICD-10-CM | POA: Diagnosis not present

## 2018-12-04 DIAGNOSIS — Z9049 Acquired absence of other specified parts of digestive tract: Secondary | ICD-10-CM | POA: Diagnosis not present

## 2018-12-04 DIAGNOSIS — K5090 Crohn's disease, unspecified, without complications: Secondary | ICD-10-CM | POA: Diagnosis not present

## 2018-12-04 DIAGNOSIS — F17210 Nicotine dependence, cigarettes, uncomplicated: Secondary | ICD-10-CM | POA: Diagnosis not present

## 2018-12-04 DIAGNOSIS — Z79899 Other long term (current) drug therapy: Secondary | ICD-10-CM | POA: Diagnosis not present

## 2018-12-04 DIAGNOSIS — K219 Gastro-esophageal reflux disease without esophagitis: Secondary | ICD-10-CM | POA: Diagnosis not present

## 2018-12-04 DIAGNOSIS — N3941 Urge incontinence: Secondary | ICD-10-CM | POA: Insufficient documentation

## 2018-12-04 DIAGNOSIS — Z87442 Personal history of urinary calculi: Secondary | ICD-10-CM | POA: Insufficient documentation

## 2018-12-04 DIAGNOSIS — M1990 Unspecified osteoarthritis, unspecified site: Secondary | ICD-10-CM | POA: Insufficient documentation

## 2018-12-04 DIAGNOSIS — N183 Chronic kidney disease, stage 3 (moderate): Secondary | ICD-10-CM | POA: Insufficient documentation

## 2018-12-04 DIAGNOSIS — E1122 Type 2 diabetes mellitus with diabetic chronic kidney disease: Secondary | ICD-10-CM | POA: Insufficient documentation

## 2018-12-04 DIAGNOSIS — I129 Hypertensive chronic kidney disease with stage 1 through stage 4 chronic kidney disease, or unspecified chronic kidney disease: Secondary | ICD-10-CM | POA: Insufficient documentation

## 2018-12-04 DIAGNOSIS — Z8711 Personal history of peptic ulcer disease: Secondary | ICD-10-CM | POA: Insufficient documentation

## 2018-12-04 DIAGNOSIS — F419 Anxiety disorder, unspecified: Secondary | ICD-10-CM | POA: Diagnosis not present

## 2018-12-04 HISTORY — DX: Long term (current) use of anticoagulants: Z79.01

## 2018-12-04 HISTORY — PX: BOTOX INJECTION: SHX5754

## 2018-12-04 LAB — GLUCOSE, CAPILLARY
Glucose-Capillary: 101 mg/dL — ABNORMAL HIGH (ref 70–99)
Glucose-Capillary: 87 mg/dL (ref 70–99)

## 2018-12-04 LAB — POCT PREGNANCY, URINE: Preg Test, Ur: NEGATIVE

## 2018-12-04 SURGERY — BOTOX INJECTION
Anesthesia: Monitor Anesthesia Care | Site: Bladder

## 2018-12-04 MED ORDER — ONABOTULINUMTOXINA 100 UNITS IJ SOLR
INTRAMUSCULAR | Status: AC
  Filled 2018-12-04: qty 200

## 2018-12-04 MED ORDER — PROPOFOL 500 MG/50ML IV EMUL
INTRAVENOUS | Status: DC | PRN
  Administered 2018-12-04: 200 ug/kg/min via INTRAVENOUS

## 2018-12-04 MED ORDER — TRIAMCINOLONE ACETONIDE 40 MG/ML IJ SUSP
INTRAMUSCULAR | Status: AC
  Filled 2018-12-04: qty 1

## 2018-12-04 MED ORDER — ONDANSETRON HCL 4 MG/2ML IJ SOLN
INTRAMUSCULAR | Status: DC | PRN
  Administered 2018-12-04: 4 mg via INTRAVENOUS

## 2018-12-04 MED ORDER — SODIUM CHLORIDE (PF) 0.9 % IJ SOLN
INTRAMUSCULAR | Status: DC | PRN
  Administered 2018-12-04: 10 mL

## 2018-12-04 MED ORDER — PROPOFOL 10 MG/ML IV BOLUS
INTRAVENOUS | Status: AC
  Filled 2018-12-04: qty 40

## 2018-12-04 MED ORDER — MIDAZOLAM HCL 2 MG/2ML IJ SOLN
INTRAMUSCULAR | Status: AC
  Filled 2018-12-04: qty 2

## 2018-12-04 MED ORDER — FENTANYL CITRATE (PF) 100 MCG/2ML IJ SOLN
INTRAMUSCULAR | Status: AC
  Filled 2018-12-04: qty 2

## 2018-12-04 MED ORDER — ONABOTULINUMTOXINA 100 UNITS IJ SOLR
INTRAMUSCULAR | Status: DC | PRN
  Administered 2018-12-04: 100 [IU] via INTRAMUSCULAR

## 2018-12-04 MED ORDER — MIDAZOLAM HCL 2 MG/2ML IJ SOLN
INTRAMUSCULAR | Status: DC | PRN
  Administered 2018-12-04: 1 mg via INTRAVENOUS

## 2018-12-04 MED ORDER — EPHEDRINE 5 MG/ML INJ
INTRAVENOUS | Status: AC
  Filled 2018-12-04: qty 10

## 2018-12-04 MED ORDER — ONDANSETRON HCL 4 MG/2ML IJ SOLN
INTRAMUSCULAR | Status: AC
  Filled 2018-12-04: qty 2

## 2018-12-04 MED ORDER — LIDOCAINE 2% (20 MG/ML) 5 ML SYRINGE
INTRAMUSCULAR | Status: AC
  Filled 2018-12-04: qty 5

## 2018-12-04 MED ORDER — CIPROFLOXACIN HCL 500 MG PO TABS: 500.0000 mg | ORAL_TABLET | Freq: Two times a day (BID) | ORAL | 0 refills | Status: AC

## 2018-12-04 MED ORDER — STERILE WATER FOR IRRIGATION IR SOLN
Status: DC | PRN
  Administered 2018-12-04: 3000 mL

## 2018-12-04 MED ORDER — SODIUM CHLORIDE (PF) 0.9 % IJ SOLN
INTRAMUSCULAR | Status: AC
  Filled 2018-12-04: qty 50

## 2018-12-04 MED ORDER — DEXAMETHASONE SODIUM PHOSPHATE 10 MG/ML IJ SOLN
INTRAMUSCULAR | Status: AC
  Filled 2018-12-04: qty 1

## 2018-12-04 MED ORDER — CIPROFLOXACIN IN D5W 400 MG/200ML IV SOLN
INTRAVENOUS | Status: AC
  Filled 2018-12-04: qty 200

## 2018-12-04 MED ORDER — BUPIVACAINE HCL (PF) 0.5 % IJ SOLN
INTRAMUSCULAR | Status: AC
  Filled 2018-12-04: qty 30

## 2018-12-04 MED ORDER — LIDOCAINE 2% (20 MG/ML) 5 ML SYRINGE
INTRAMUSCULAR | Status: DC | PRN
  Administered 2018-12-04: 25 mg via INTRAVENOUS

## 2018-12-04 MED ORDER — SODIUM CHLORIDE 0.9 % IV SOLN
INTRAVENOUS | Status: DC
  Administered 2018-12-04: 50 mL/h via INTRAVENOUS
  Administered 2018-12-04: 08:00:00 via INTRAVENOUS
  Filled 2018-12-04: qty 1000

## 2018-12-04 MED ORDER — CIPROFLOXACIN IN D5W 400 MG/200ML IV SOLN
INTRAVENOUS | Status: DC | PRN
  Administered 2018-12-04: 400 mg via INTRAVENOUS

## 2018-12-04 MED ORDER — FENTANYL CITRATE (PF) 100 MCG/2ML IJ SOLN
INTRAMUSCULAR | Status: DC | PRN
  Administered 2018-12-04 (×2): 25 ug via INTRAVENOUS

## 2018-12-04 SURGICAL SUPPLY — 18 items
BAG DRAIN URO-CYSTO SKYTR STRL (DRAIN) ×3 IMPLANT
CATH ROBINSON RED A/P 14FR (CATHETERS) IMPLANT
CLOTH BEACON ORANGE TIMEOUT ST (SAFETY) ×3 IMPLANT
ELECT REM PT RETURN 9FT ADLT (ELECTROSURGICAL) ×3
ELECTRODE REM PT RTRN 9FT ADLT (ELECTROSURGICAL) ×1 IMPLANT
GLOVE BIO SURGEON STRL SZ7.5 (GLOVE) ×6 IMPLANT
GOWN STRL REUS W/ TWL XL LVL3 (GOWN DISPOSABLE) ×2 IMPLANT
GOWN STRL REUS W/TWL XL LVL3 (GOWN DISPOSABLE) ×4
KIT TURNOVER CYSTO (KITS) ×3 IMPLANT
MANIFOLD NEPTUNE II (INSTRUMENTS) ×3 IMPLANT
NEEDLE ASPIRATION 22 (NEEDLE) ×3 IMPLANT
NEEDLE HYPO 18GX1.5 BLUNT FILL (NEEDLE) ×3 IMPLANT
PACK CYSTO (CUSTOM PROCEDURE TRAY) ×3 IMPLANT
SYR 20CC LL (SYRINGE) ×3 IMPLANT
SYR CONTROL 10ML LL (SYRINGE) ×3 IMPLANT
TUBE CONNECTING 12'X1/4 (SUCTIONS) ×1
TUBE CONNECTING 12X1/4 (SUCTIONS) ×2 IMPLANT
WATER STERILE IRR 3000ML UROMA (IV SOLUTION) ×3 IMPLANT

## 2018-12-04 NOTE — Discharge Instructions (Signed)

## 2018-12-04 NOTE — Anesthesia Postprocedure Evaluation (Signed)
Anesthesia Post Note  Patient: Kari Hahn  Procedure(s) Performed: BOTOX INJECTION WITH CYSTOSCOPY WITH FULGERATION (N/A Bladder)     Patient location during evaluation: PACU Anesthesia Type: MAC Level of consciousness: awake and alert Pain management: pain level controlled Vital Signs Assessment: post-procedure vital signs reviewed and stable Respiratory status: spontaneous breathing, nonlabored ventilation and respiratory function stable Cardiovascular status: blood pressure returned to baseline and stable Postop Assessment: no apparent nausea or vomiting Anesthetic complications: no    Last Vitals:  Vitals:   12/04/18 0915 12/04/18 0930  BP: 131/76 130/77  Pulse: 74 72  Resp: 14 13  Temp:    SpO2: 94% 93%    Last Pain:  Vitals:   12/04/18 0915  TempSrc:   PainSc: [place] E Maree Ainley

## 2018-12-04 NOTE — H&P (Signed)
Urology Preoperative H&P   Chief Complaint: Urge incontinence  History of Present Illness: KEYMORA GRILLOT is a 48 y.o. female with a history of kidney stones, mixed urinary incontinence, nocturnal enuresis and Crohn's disease (currently on Humira).   She is status post cystoscopy with Botox injections (100 units) in December 2019   SUI=UUI   G4P2 (c-section x2).   The patient did have a prior history of fecal incontinence due to her Crohn's disease, but was recently started on Humira which has drastically improved her GI symptoms.   UDS from 04/08/2018 showed a hypersensitive bladder with a very low filling capacity at 100 ml.   She denies interval UTIs, dysuria or hematuria.  She is here today for repeat bladder botox injections.  Past Medical History:  Diagnosis Date  . Abnormal uterine bleeding (AUB)   . Anemia, iron deficiency    followed by hemotologist-- dr Marin Olp--  due to chronic blood loss, abnormal uterine bleeding--- treatment IV Iron infusions  . Anemia, pernicious   . Anticoagulated    xarelto  . Anxiety   . Arthritis    knees , back  . Borderline diabetes    does not check cbg at home  . CKD (chronic kidney disease), stage III ([place])   . Crohn disease ([place]) 09/2007   followed by GI w/ [facility] in [place]/  treatment Humira   . Frequency of urination   . GERD (gastroesophageal reflux disease)   . 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 History of gastric ulcer    2009--- esophagus and gastrium ulcers, candida  . History of kidney stones   . 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 Hypertension   . 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 Neuropathy of  leg    right leg  . Urge urinary incontinence   . Wears glasses     Past Surgical History:  Procedure Laterality Date  . BOTOX INJECTION N/A 06/11/2018   Procedure: BOTOX INJECTION WITH CYSTOSCOPY;  Surgeon: Ceasar Mons, MD;  [facility];  Service: Urology;  Laterality: N/A;  . CESAREAN SECTION  x2  last one 1998  . CHOLECYSTECTOMY  03/29/2012   Procedure: LAPAROSCOPIC CHOLECYSTECTOMY WITH INTRAOPERATIVE CHOLANGIOGRAM;  Surgeon: Odis Hollingshead, MD;  Location: [place];  Service: General;  Laterality: N/A;  laparoscopic cholecystectomy with intraopertaive choloangiogram  . CYSTOSCOPY N/A 06/11/2018   Procedure: Consuela Mimes;  Surgeon: Ceasar Mons, MD;  Location: [facility];  Service: Urology;  Laterality: N/A;  . CYSTOSCOPY W/ URETERAL STENT PLACEMENT  2005  APPROX.  Marland Kitchen CYSTOSCOPY/URETEROSCOPY/HOLMIUM LASER/STENT PLACEMENT Left 08/21/2017   Procedure: CYSTOSCOPY/RETROGRADE/URETEROSCOPY/HOLMIUM LASER/STENT PLACEMENT;  Surgeon: Ceasar Mons, MD;  Location[facility];  Service: Urology;  Laterality: Left;  . EXTRACORPOREAL SHOCK WAVE LITHOTRIPSY  2005 approx.  Marland Kitchen KNEE SURGERY Left age 70  . LAPAROSCOPIC NISSEN FUNDOPLICATION  3614  approx.  . TRANSTHORACIC ECHOCARDIOGRAM  08/06/2009   ef 43%, grade 1 diastolic dysfunction/  mild LAE    Allergies:  Allergies  Allergen Reactions  . Eggs Or Egg-Derived Products Nausea And Vomiting    When eats too much  . Ibuprofen Other (  See Comments)    stomache upset    History reviewed. No pertinent family history.  Social History:  reports that she has been smoking cigarettes. She started smoking about 33 years ago. She has a 70.00 pack-year smoking history. She has never used smokeless tobacco. She reports that she does not drink alcohol or use drugs.  ROS: A complete review of systems was performed.  All systems are negative except for pertinent  findings as noted.  Physical Exam:  Vital signs in last 24 hours: Temp:  [97.9 F (36.6 C)] 97.9 F (36.6 C) (06/10 0648) Pulse Rate:  [91] 91 (06/10 0648) Resp:  [18] 18 (06/10 0648) BP: (164)/(96) 164/96 (06/10 0648) SpO2:  [95 %] 95 % (06/10 0648) Weight:  [82.5 kg] 82.5 kg (06/10 0648) Constitutional:  Alert and oriented, No acute distress Cardiovascular: Regular rate and rhythm, No JVD Respiratory: Normal respiratory effort, Lungs clear bilaterally GI: Abdomen is soft, nontender, nondistended, no abdominal masses GU: No CVA tenderness Lymphatic: No lymphadenopathy Neurologic: Grossly intact, no focal deficits Psychiatric: Normal mood and affect  Laboratory Data:  No results for input(s): WBC, HGB, HCT, PLT in the last 72 hours.  No results for input(s): NA, K, CL, GLUCOSE, BUN, CALCIUM, CREATININE in the last 72 hours.  Invalid input(s): CO3   Results for orders placed or performed during the hospital encounter of 12/04/18 (from the past 24 hour(s))  Pregnancy, urine POC     Status: None   Collection Time: 12/04/18  7:01 AM  Result Value Ref Range   Preg Test, Ur NEGATIVE NEGATIVE   Recent Results (from the past 240 hour(s))  SARS Coronavirus 2 (CEPHEID - Performed in [place] hospital lab), Hosp Order     Status: None   Collection Time: 12/02/18 11:39 AM  Result Value Ref Range Status   SARS Coronavirus 2 NEGATIVE NEGATIVE Final    Comment: (NOTE) If result is NEGATIVE SARS-CoV-2 target nucleic acids are NOT DETECTED. The SARS-CoV-2 RNA is generally detectable in upper and lower  respiratory specimens during the acute phase of infection. The lowest  concentration of SARS-CoV-2 viral copies this assay can detect is 250  copies / mL. A negative result does not preclude SARS-CoV-2 infection  and should not be used as the sole basis for treatment or other  patient management decisions.  A negative result may occur with  improper specimen collection / handling,  submission of specimen other  than nasopharyngeal swab, presence of viral mutation(s) within the  areas targeted by this assay, and inadequate number of viral copies  (<250 copies / mL). A negative result must be combined with clinical  observations, patient history, and epidemiological information. If result is POSITIVE SARS-CoV-2 target nucleic acids are DETECTED. The SARS-CoV-2 RNA is generally detectable in upper and lower  respiratory specimens dur ing the acute phase of infection.  Positive  results are indicative of active infection with SARS-CoV-2.  Clinical  correlation with patient history and other diagnostic information is  necessary to determine patient infection status.  Positive results do  not rule out bacterial infection or co-infection with other viruses. If result is PRESUMPTIVE POSTIVE SARS-CoV-2 nucleic acids MAY BE PRESENT.   A presumptive positive result was obtained on the submitted specimen  and confirmed on repeat testing.  While 2019 novel coronavirus  (SARS-CoV-2) nucleic acids may be present in the submitted sample  additional confirmatory testing may be necessary for epidemiological  and / or clinical management purposes  to differentiate between  SARS-CoV-2 and other Sarbecovirus currently known to infect humans.  If clinically indicated additional testing with an alternate test  methodology [phone]) is advised. The SARS-CoV-2 RNA is generally  detectable in upper and lower respiratory sp ecimens during the acute  phase of infection. The expected result is Negative. Fact Sheet for Patients:  StrictlyIdeas.no Fact Sheet for Healthcare Providers: BankingDealers.co.za This test is not yet approved or cleared by the [place] FDA and has been authorized for detection and/or diagnosis of SARS-CoV-2 by FDA under an Emergency Use Authorization (EUA).  This EUA will remain in effect (meaning this test can be  used) for the duration of the COVID-19 declaration under Section 564(b)(1) of the Act, 21 U.S.C. section 360bbb-3(b)(1), unless the authorization is terminated or revoked sooner. Performed at [facility], [place] [address] [place], [place] [address]     Renal Function: No results for input(s): CREATININE in the last 168 hours. Estimated Creatinine Clearance: 48.9 mL/min (A) (by C-G formula based on SCr of 1.37 mg/dL (H)).  Radiologic Imaging: No results found.  I independently reviewed the above imaging studies.  Assessment and Plan TAYLEY MUDRICK is a 48 y.o. female with refractory urge incontinence  -The risks, benefits and alternatives of cystoscopy with botox injections was discussed with the patient. Risks include, but are not limited to, bleeding, UTIs, urinary retention, dysuria, MI, CVA, PE, DVT and the inherent risks with general anesthesia.  She voices understanding and wishes to proceed.  Ellison Hughs, MD 12/04/2018, 7:26 AM  [facility] Pager: [phone]

## 2018-12-04 NOTE — Anesthesia Procedure Notes (Signed)
Procedure Name: MAC Date/Time: 12/04/2018 8:20 AM Performed by: Wanita Chamberlain, CRNA Pre-anesthesia Checklist: Patient identified, Emergency Drugs available, Suction available and Patient being monitored Patient Re-evaluated:Patient Re-evaluated prior to induction Oxygen Delivery Method: Nasal cannula Induction Type: IV induction Placement Confirmation: CO2 detector,  breath sounds checked- equal and bilateral and positive ETCO2

## 2018-12-04 NOTE — Progress Notes (Signed)
SPOKE W/  _[name] patient     SCREENING SYMPTOMS OF COVID 19:   COUGH--no  RUNNY NOSE---no   SORE THROAT---no  NASAL CONGESTION---no-  SNEEZING----no  SHORTNESS OF BREATH---no  DIFFICULTY BREATHING---no  TEMP >100.0 -----no  UNEXPLAINED BODY ACHES------no  CHILLS -------- no  HEADACHES ---------no  LOSS OF SMELL/ TASTE --------no    HAVE YOU OR ANY FAMILY MEMBER TRAVELLED PAST 14 DAYS OUT OF THE   COUNTY---no STATE----no COUNTRY----no  HAVE YOU OR ANY FAMILY MEMBER BEEN EXPOSED TO ANYONE WITH COVID 19? no

## 2018-12-04 NOTE — Op Note (Signed)
Operative Note  Preoperative diagnosis:  1.  Refractory urge incontinence  Postoperative diagnosis: 1.  Refractory urge incontinence  Procedure(s): 1.  Cystoscopy with Botox bladder injections (100 units)  Surgeon: Ellison Hughs, MD  Assistants:  None  Anesthesia: MAC  Complications:  None  EBL: Less than 5 mL  Specimens: 1.  None  Drains/Catheters: 1.  None  Intraoperative findings:   1. There was a small vessel that was bleeding following a Botox injection that was fulgurated and hemostatic at the conclusion of the case.  Otherwise, there were no other remarkable intravesical findings.  Indication:  Kari Hahn is a 48 y.o. female with refractory urge incontinence that has improved following recent bladder Botox injections.  She is here today for repeat Botox injections.  She has been consented for the above procedures, voices understanding wishes to proceed.  Description of procedure:  After informed consent was obtained, the patient was brought to the operating room and general LMA anesthesia was administered. The patient was then placed in the dorsolithotomy position and prepped and draped in the usual sterile fashion. A timeout was performed. A 23 French rigid cystoscope was then inserted into the urethral meatus and advanced into the bladder under direct vision. A complete bladder survey revealed no intravesical pathology.  A total of 100 units of Botox, diluted in 10 mL of saline, was systematically injected and 1 mL increments throughout the detrusor musculature.  There was a small blood vessel in the posterior bladder wall that was bleeding following an injection that was fulgurated with electrocautery and found to be hemostatic at the conclusion of the case.  The patient's bladder was drained.  She tolerated the procedure well and was transferred to the postanesthesia in stable condition.  Plan:  Discharge home

## 2018-12-04 NOTE — Transfer of Care (Signed)
Immediate Anesthesia Transfer of Care Note  Patient: Kari Hahn  Procedure(s) Performed: BOTOX INJECTION WITH CYSTOSCOPY WITH FULGERATION (N/A Bladder)  Patient Location: PACU  Anesthesia Type:MAC  Level of Consciousness: awake, alert , oriented and patient cooperative  Airway & Oxygen Therapy: Patient Spontanous Breathing and Patient connected to nasal cannula oxygen  Post-op Assessment: Report given to RN and Post -op Vital signs reviewed and stable  Post vital signs: Reviewed and stable  Last Vitals:  Vitals Value Taken Time  BP    Temp    Pulse 84 12/04/2018  8:47 AM  Resp    SpO2 97 % 12/04/2018  8:47 AM  Vitals shown include unvalidated device data.  Last Pain:  Vitals:   12/04/18 0733  TempSrc:   PainSc: 7       Patients Stated Pain Goal: 7 (62/[id number])  Complications: No apparent anesthesia complications

## 2018-12-05 ENCOUNTER — Encounter (HOSPITAL_BASED_OUTPATIENT_CLINIC_OR_DEPARTMENT_OTHER): Payer: Self-pay | Admitting: Urology

## 2018-12-10 ENCOUNTER — Ambulatory Visit: Payer: BC Managed Care – PPO

## 2018-12-11 ENCOUNTER — Inpatient Hospital Stay: Payer: BC Managed Care – PPO

## 2018-12-11 ENCOUNTER — Other Ambulatory Visit: Payer: Self-pay

## 2018-12-11 ENCOUNTER — Other Ambulatory Visit: Payer: Self-pay | Admitting: Family

## 2018-12-11 VITALS — BP 130/69 | HR 81 | Temp 98.3°F | Resp 16

## 2018-12-11 DIAGNOSIS — D510 Vitamin B12 deficiency anemia due to intrinsic factor deficiency: Secondary | ICD-10-CM

## 2018-12-11 DIAGNOSIS — D500 Iron deficiency anemia secondary to blood loss (chronic): Secondary | ICD-10-CM | POA: Diagnosis not present

## 2018-12-11 DIAGNOSIS — D509 Iron deficiency anemia, unspecified: Secondary | ICD-10-CM

## 2018-12-11 MED ORDER — SODIUM CHLORIDE 0.9 % IV SOLN
INTRAVENOUS | Status: DC
  Administered 2018-12-11: 09:00:00 via INTRAVENOUS
  Filled 2018-12-11: qty 250

## 2018-12-11 MED ORDER — CYANOCOBALAMIN 1000 MCG/ML IJ SOLN
1000.0000 ug | Freq: Once | INTRAMUSCULAR | Status: AC
  Administered 2018-12-11: 1000 ug via INTRAMUSCULAR

## 2018-12-11 MED ORDER — CYANOCOBALAMIN 1000 MCG/ML IJ SOLN
INTRAMUSCULAR | Status: AC
  Filled 2018-12-11: qty 1

## 2018-12-11 MED ORDER — SODIUM CHLORIDE 0.9 % IV SOLN
510.0000 mg | Freq: Once | INTRAVENOUS | Status: AC
  Administered 2018-12-11: 510 mg via INTRAVENOUS
  Filled 2018-12-11: qty 17

## 2018-12-11 NOTE — Patient Instructions (Signed)

## 2018-12-24 ENCOUNTER — Other Ambulatory Visit: Payer: Self-pay | Admitting: Family

## 2018-12-24 DIAGNOSIS — K50919 Crohn's disease, unspecified, with unspecified complications: Secondary | ICD-10-CM

## 2018-12-25 ENCOUNTER — Inpatient Hospital Stay: Payer: BC Managed Care – PPO | Attending: Hematology & Oncology

## 2018-12-25 DIAGNOSIS — Z7901 Long term (current) use of anticoagulants: Secondary | ICD-10-CM | POA: Insufficient documentation

## 2018-12-25 DIAGNOSIS — D500 Iron deficiency anemia secondary to blood loss (chronic): Secondary | ICD-10-CM | POA: Insufficient documentation

## 2018-12-25 DIAGNOSIS — I82431 Acute embolism and thrombosis of right popliteal vein: Secondary | ICD-10-CM | POA: Insufficient documentation

## 2018-12-25 DIAGNOSIS — D510 Vitamin B12 deficiency anemia due to intrinsic factor deficiency: Secondary | ICD-10-CM | POA: Insufficient documentation

## 2018-12-25 DIAGNOSIS — K922 Gastrointestinal hemorrhage, unspecified: Secondary | ICD-10-CM | POA: Insufficient documentation

## 2019-01-07 ENCOUNTER — Inpatient Hospital Stay (HOSPITAL_BASED_OUTPATIENT_CLINIC_OR_DEPARTMENT_OTHER): Payer: BC Managed Care – PPO | Admitting: Family

## 2019-01-07 ENCOUNTER — Telehealth: Payer: Self-pay | Admitting: Family

## 2019-01-07 ENCOUNTER — Encounter: Payer: Self-pay | Admitting: Family

## 2019-01-07 ENCOUNTER — Other Ambulatory Visit: Payer: Self-pay

## 2019-01-07 ENCOUNTER — Inpatient Hospital Stay: Payer: BC Managed Care – PPO

## 2019-01-07 VITALS — BP 135/82 | HR 82 | Temp 98.7°F | Resp 18 | Ht 62.0 in | Wt 176.0 lb

## 2019-01-07 DIAGNOSIS — K922 Gastrointestinal hemorrhage, unspecified: Secondary | ICD-10-CM

## 2019-01-07 DIAGNOSIS — Z7901 Long term (current) use of anticoagulants: Secondary | ICD-10-CM | POA: Diagnosis not present

## 2019-01-07 DIAGNOSIS — I82431 Acute embolism and thrombosis of right popliteal vein: Secondary | ICD-10-CM

## 2019-01-07 DIAGNOSIS — D500 Iron deficiency anemia secondary to blood loss (chronic): Secondary | ICD-10-CM

## 2019-01-07 DIAGNOSIS — D510 Vitamin B12 deficiency anemia due to intrinsic factor deficiency: Secondary | ICD-10-CM | POA: Diagnosis not present

## 2019-01-07 LAB — CMP (CANCER CENTER ONLY)
ALT: 9 U/L (ref 0–44)
AST: 12 U/L — ABNORMAL LOW (ref 15–41)
Albumin: 3.8 g/dL (ref 3.5–5.0)
Alkaline Phosphatase: 92 U/L (ref 38–126)
Anion gap: 10 (ref 5–15)
BUN: 10 mg/dL (ref 6–20)
CO2: 27 mmol/L (ref 22–32)
Calcium: 9.3 mg/dL (ref 8.9–10.3)
Chloride: 99 mmol/L (ref 98–111)
Creatinine: 1.28 mg/dL — ABNORMAL HIGH (ref 0.44–1.00)
GFR, Est AFR Am: 56 mL/min — ABNORMAL LOW (ref >60)
GFR, Estimated: 49 mL/min — ABNORMAL LOW (ref >60)
Glucose, Bld: 137 mg/dL — ABNORMAL HIGH (ref 70–99)
Potassium: 3.2 mmol/L — ABNORMAL LOW (ref 3.5–5.1)
Sodium: 136 mmol/L (ref 135–145)
Total Bilirubin: 0.2 mg/dL — ABNORMAL LOW (ref 0.3–1.2)
Total Protein: 6.9 g/dL (ref 6.5–8.1)

## 2019-01-07 LAB — CBC WITH DIFFERENTIAL (CANCER CENTER ONLY)
Abs Immature Granulocytes: 0.08 10*3/uL — ABNORMAL HIGH (ref 0.00–0.07)
Basophils Absolute: 0.1 10*3/uL (ref 0.0–0.1)
Basophils Relative: 1 %
Eosinophils Absolute: 0.3 10*3/uL (ref 0.0–0.5)
Eosinophils Relative: 2 %
HCT: 44.4 % (ref 36.0–46.0)
Hemoglobin: 14.3 g/dL (ref 12.0–15.0)
Immature Granulocytes: 1 %
Lymphocytes Relative: 24 %
Lymphs Abs: 2.6 10*3/uL (ref 0.7–4.0)
MCH: 30.6 pg (ref 26.0–34.0)
MCHC: 32.2 g/dL (ref 30.0–36.0)
MCV: 95.1 fL (ref 80.0–100.0)
Monocytes Absolute: 0.8 10*3/uL (ref 0.1–1.0)
Monocytes Relative: 7 %
Neutro Abs: 7.3 10*3/uL (ref 1.7–7.7)
Neutrophils Relative %: 65 %
Platelet Count: 489 10*3/uL — ABNORMAL HIGH (ref 150–400)
RBC: 4.67 MIL/uL (ref 3.87–5.11)
RDW: 16.8 % — ABNORMAL HIGH (ref 11.5–15.5)
WBC Count: 11.1 10*3/uL — ABNORMAL HIGH (ref 4.0–10.5)
nRBC: 0 % (ref 0.0–0.2)

## 2019-01-07 LAB — IRON AND TIBC
Iron: 70 ug/dL (ref 41–142)
Saturation Ratios: 22 % (ref 21–57)
TIBC: 315 ug/dL (ref 236–444)
UIBC: 245 ug/dL (ref 120–384)

## 2019-01-07 LAB — FERRITIN: Ferritin: 106 ng/mL (ref 11–307)

## 2019-01-07 LAB — RETICULOCYTES
Immature Retic Fract: 9.1 % (ref 2.3–15.9)
RBC.: 4.71 MIL/uL (ref 3.87–5.11)
Retic Count, Absolute: 59.8 10*3/uL (ref 19.0–186.0)
Retic Ct Pct: 1.3 % (ref 0.4–3.1)

## 2019-01-07 NOTE — Progress Notes (Signed)
Hematology and Oncology Follow Up Visit  Kari Hahn [id number] [birth year] 48 y.o. 01/07/2019   Principle Diagnosis:  Right lower extremity DVT Idiopathic aortic thrombosis Pernicious anemia Iron deficiency anemia Crohn's disease  Current Therapy:   Xarelto 20 mg PO daily Vitamin B12 PO BID daily IV iron as needed   Interim History:  Kari Hahn is here today for follow-up. She is feeling fatigued and states that she is sleeping all the time.  She states that she has been checked in the past for sleep apnea and did not have it.  Iron studies are pending. Hgb is stable at 14.3, MCV 95 and platelet count 489.  She has only had occasional spotting recently and not a regular cycle. No other bleeding. No bruising or petechiae.  She notes palpitations with over exertion at times.  No fever, chills, n/v, cough, rash, dizziness, SOB, chest pain, abdominal pain or changes in bowel or bladder habits.  No swelling, tenderness, numbness or tingling in her extremities at this time.  She is eating well and staying hydrated. Her weight is stable.   ECOG Performance Status: 1 - Symptomatic but completely ambulatory  Medications:  Allergies as of 01/07/2019      Reactions   Eggs Or Egg-derived Products Nausea And Vomiting   When eats too much   Ibuprofen Other (See Comments)   stomache upset      Medication List       Accurate as of January 07, 2019 10:36 AM. If you have any questions, ask your nurse or doctor.        STOP taking these medications   medroxyPROGESTERone 10 MG tablet Commonly known as: PROVERA Stopped by: Kari Peace, NP   promethazine 25 MG tablet Commonly known as: PHENERGAN Stopped by: Kari Peace, NP     TAKE these medications   desmopressin 0.2 MG tablet Commonly known as: DDAVP Take 0.2 mg by mouth 2 (two) times daily. 09/02/18-BID per pt   Exforge 5-160 MG tablet Generic drug: amLODipine-valsartan 1 TABLET BY MOUTH DAILY FOR BLOOD PRESSURE---  takes in am after eaten   fluticasone 50 MCG/ACT nasal spray Commonly known as: FLONASE daily as needed.   folic acid 1 MG tablet Commonly known as: FOLVITE Take 1 mg by mouth daily.   gabapentin 800 MG tablet Commonly known as: NEURONTIN Take 800 mg by mouth 3 (three) times daily.   Humira Pen 40 MG/0.4ML Pnkt Generic drug: Adalimumab Every 2 weeks   Lexapro 20 MG tablet Generic drug: escitalopram Take 40 mg by mouth at bedtime. 2  20 mg tab = 40 mg daily   ondansetron 8 MG tablet Commonly known as: ZOFRAN TAKE 1 TABLET BY MOUTH 2 TIMES A DAY   oxyCODONE-acetaminophen 10-325 MG tablet Commonly known as: PERCOCET Take 1 tablet by mouth 4 (four) times daily.   pantoprazole 40 MG tablet Commonly known as: PROTONIX Take 40 mg by mouth daily.   potassium chloride SA 20 MEQ tablet Commonly known as: K-DUR Take 20 mEq by mouth every evening.   rivaroxaban 10 MG Tabs tablet Commonly known as: XARELTO Take 1 tablet (10 mg total) by mouth daily with supper.   VITAMIN B 12 PO Take by mouth daily.   vitamin C 1000 MG tablet Take 1,000 mg by mouth daily.   Vitamin D3 125 MCG (5000 UT) Tabs Take by mouth every morning.   zinc gluconate 50 MG tablet Take 50 mg by mouth daily.       Allergies:  Allergies  Allergen Reactions  . Eggs Or Egg-Derived Products Nausea And Vomiting    When eats too much  . Ibuprofen Other (See Comments)    stomache upset    Past Medical History, Surgical history, Social history, and Family History were reviewed and updated.  Review of Systems: All other 10 point review of systems is negative.   Physical Exam:  height is 5' 2"  (1.575 m) and weight is 176 lb (79.8 kg). Her temporal temperature is 98.7 F (37.1 C). Her blood pressure is 135/82 and her pulse is 82. Her respiration is 18 and oxygen saturation is 96%.   Wt Readings from Last 3 Encounters:  01/07/19 176 lb (79.8 kg)  12/04/18 181 lb 14.4 oz (82.5 kg)  11/26/18 179 lb  3.2 oz (81.3 kg)    Ocular: Sclerae unicteric, pupils equal, round and reactive to light Ear-nose-throat: Oropharynx clear, dentition fair Lymphatic: No cervical or supraclavicular adenopathy Lungs no rales or rhonchi, good excursion bilaterally Heart regular rate and rhythm, no murmur appreciated Abd soft, nontender, positive bowel sounds, no liver or spleen tip palpated on exam, no fluid wave  MSK no focal spinal tenderness, no joint edema Neuro: non-focal, well-oriented, appropriate affect Breasts: Deferred   Lab Results  Component Value Date   WBC 11.1 (H) 01/07/2019   HGB 14.3 01/07/2019   HCT 44.4 01/07/2019   MCV 95.1 01/07/2019   PLT 489 (H) 01/07/2019   Lab Results  Component Value Date   FERRITIN 56 11/26/2018   IRON 30 (L) 11/26/2018   TIBC 365 11/26/2018   UIBC 335 11/26/2018   IRONPCTSAT 8 (L) 11/26/2018   Lab Results  Component Value Date   RETICCTPCT 1.3 01/07/2019   RBC 4.71 01/07/2019   RETICCTABS 88.8 12/21/2014   No results found for: KPAFRELGTCHN, LAMBDASER, KAPLAMBRATIO No results found for: IGGSERUM, IGA, IGMSERUM No results found for: Odetta Pink, SPEI   Chemistry      Component Value Date/Time   NA 136 01/07/2019 0941   NA 141 03/12/2017 0929   K 3.2 (L) 01/07/2019 0941   K 2.8 (LL) 03/12/2017 0929   CL 99 01/07/2019 0941   CL 106 12/08/2011 1145   CO2 27 01/07/2019 0941   CO2 27 03/12/2017 0929   BUN 10 01/07/2019 0941   BUN 9.4 03/12/2017 0929   CREATININE 1.28 (H) 01/07/2019 0941   CREATININE 1.2 (H) 03/12/2017 0929      Component Value Date/Time   CALCIUM 9.3 01/07/2019 0941   CALCIUM 9.5 03/12/2017 0929   ALKPHOS 92 01/07/2019 0941   ALKPHOS 125 03/12/2017 0929   AST 12 (L) 01/07/2019 0941   AST 13 03/12/2017 0929   ALT 9 01/07/2019 0941   ALT 9 03/12/2017 0929   BILITOT 0.2 (L) 01/07/2019 0941   BILITOT <0.22 03/12/2017 0929       Impression and Plan: Kari Hahn is  a very pleasant 48 yo caucasian female with iron deficiency anemia due to GI blood loss with Crohn's and heavy menstrual bleeding. We will see what her iron studies show and bring her back in for infusion if needed.  We will plan to see her back in another 6 weeks.  She will contact our office with any questions or concerns.  We can certainly see her sooner if needed.    Kari Peace, NP 7/14/202010:36 AM

## 2019-01-07 NOTE — Telephone Encounter (Signed)
Called and notified patient of appointments scheduled.  She asked that I also send her a calendar.  Calendar mailed / per 7/14 los

## 2019-01-15 ENCOUNTER — Telehealth: Payer: Self-pay | Admitting: *Deleted

## 2019-01-15 NOTE — Telephone Encounter (Signed)
Notified pt of lab results, no IV iron needed at this time. Pt has concerns regarding heavy periods, discussed calling GYN to further evaluate. Pt thanked me for the call, will call her GYN today to discuss heavy bleeding.

## 2019-02-17 ENCOUNTER — Telehealth: Payer: Self-pay | Admitting: Family

## 2019-02-17 NOTE — Telephone Encounter (Signed)
Patient called to r/s her 8/25 appt until 8/31 due to no transportation

## 2019-02-18 ENCOUNTER — Inpatient Hospital Stay: Payer: BC Managed Care – PPO

## 2019-02-18 ENCOUNTER — Inpatient Hospital Stay: Payer: BC Managed Care – PPO | Admitting: Family

## 2019-02-24 ENCOUNTER — Ambulatory Visit: Payer: BC Managed Care – PPO | Admitting: Family

## 2019-02-24 ENCOUNTER — Other Ambulatory Visit: Payer: BC Managed Care – PPO

## 2019-03-21 ENCOUNTER — Other Ambulatory Visit: Payer: BC Managed Care – PPO

## 2019-03-21 ENCOUNTER — Ambulatory Visit: Payer: BC Managed Care – PPO | Admitting: Family

## 2019-04-14 ENCOUNTER — Other Ambulatory Visit: Payer: Self-pay | Admitting: Family

## 2019-04-14 DIAGNOSIS — K50919 Crohn's disease, unspecified, with unspecified complications: Secondary | ICD-10-CM

## 2019-04-14 DIAGNOSIS — I749 Embolism and thrombosis of unspecified artery: Secondary | ICD-10-CM

## 2019-04-30 ENCOUNTER — Other Ambulatory Visit: Payer: Self-pay

## 2019-04-30 ENCOUNTER — Telehealth: Payer: Self-pay | Admitting: Family

## 2019-04-30 ENCOUNTER — Inpatient Hospital Stay: Payer: BC Managed Care – PPO

## 2019-04-30 ENCOUNTER — Inpatient Hospital Stay: Payer: BC Managed Care – PPO | Attending: Family | Admitting: Family

## 2019-04-30 VITALS — BP 139/66 | HR 95 | Temp 97.0°F | Resp 18 | Wt 172.5 lb

## 2019-04-30 DIAGNOSIS — D500 Iron deficiency anemia secondary to blood loss (chronic): Secondary | ICD-10-CM

## 2019-04-30 DIAGNOSIS — Z7901 Long term (current) use of anticoagulants: Secondary | ICD-10-CM | POA: Diagnosis not present

## 2019-04-30 DIAGNOSIS — G629 Polyneuropathy, unspecified: Secondary | ICD-10-CM | POA: Diagnosis not present

## 2019-04-30 DIAGNOSIS — K922 Gastrointestinal hemorrhage, unspecified: Secondary | ICD-10-CM | POA: Diagnosis not present

## 2019-04-30 DIAGNOSIS — I82431 Acute embolism and thrombosis of right popliteal vein: Secondary | ICD-10-CM | POA: Diagnosis not present

## 2019-04-30 DIAGNOSIS — I82401 Acute embolism and thrombosis of unspecified deep veins of right lower extremity: Secondary | ICD-10-CM | POA: Insufficient documentation

## 2019-04-30 DIAGNOSIS — D510 Vitamin B12 deficiency anemia due to intrinsic factor deficiency: Secondary | ICD-10-CM | POA: Insufficient documentation

## 2019-04-30 LAB — CBC WITH DIFFERENTIAL (CANCER CENTER ONLY)
Abs Immature Granulocytes: 0.13 10*3/uL — ABNORMAL HIGH (ref 0.00–0.07)
Basophils Absolute: 0.2 10*3/uL — ABNORMAL HIGH (ref 0.0–0.1)
Basophils Relative: 1 %
Eosinophils Absolute: 0.4 10*3/uL (ref 0.0–0.5)
Eosinophils Relative: 3 %
HCT: 37.7 % (ref 36.0–46.0)
Hemoglobin: 11.3 g/dL — ABNORMAL LOW (ref 12.0–15.0)
Immature Granulocytes: 1 %
Lymphocytes Relative: 21 %
Lymphs Abs: 2.5 10*3/uL (ref 0.7–4.0)
MCH: 25.7 pg — ABNORMAL LOW (ref 26.0–34.0)
MCHC: 30 g/dL (ref 30.0–36.0)
MCV: 85.7 fL (ref 80.0–100.0)
Monocytes Absolute: 1 10*3/uL (ref 0.1–1.0)
Monocytes Relative: 8 %
Neutro Abs: 7.7 10*3/uL (ref 1.7–7.7)
Neutrophils Relative %: 66 %
Platelet Count: 577 10*3/uL — ABNORMAL HIGH (ref 150–400)
RBC: 4.4 MIL/uL (ref 3.87–5.11)
RDW: 17.3 % — ABNORMAL HIGH (ref 11.5–15.5)
WBC Count: 11.8 10*3/uL — ABNORMAL HIGH (ref 4.0–10.5)
nRBC: 0 % (ref 0.0–0.2)

## 2019-04-30 LAB — IRON AND TIBC
Iron: 24 ug/dL — ABNORMAL LOW (ref 41–142)
Saturation Ratios: 5 % — ABNORMAL LOW (ref 21–57)
TIBC: 505 ug/dL — ABNORMAL HIGH (ref 236–444)
UIBC: 481 ug/dL — ABNORMAL HIGH (ref 120–384)

## 2019-04-30 LAB — FERRITIN: Ferritin: 13 ng/mL (ref 11–307)

## 2019-04-30 LAB — RETICULOCYTES
Immature Retic Fract: 30.1 % — ABNORMAL HIGH (ref 2.3–15.9)
RBC.: 4.43 MIL/uL (ref 3.87–5.11)
Retic Count, Absolute: 63.8 10*3/uL (ref 19.0–186.0)
Retic Ct Pct: 1.4 % (ref 0.4–3.1)

## 2019-04-30 LAB — VITAMIN B12: Vitamin B-12: 348 pg/mL (ref 180–914)

## 2019-04-30 NOTE — Progress Notes (Signed)
Hematology and Oncology Follow Up Visit  Kari Hahn [id number] Jan 19, 1971 48 y.o. 04/30/2019   Principle Diagnosis:  Right lower extremity DVT Idiopathic aortic thrombosis Pernicious anemia Iron deficiency anemia Crohn's disease  Current Therapy:   Xarelto 20 mg PO daily Vitamin B12 PO BID daily IV iron as needed   Interim History:  Kari Hahn is here today for follow-up. She is doing well but has noticed an increase in fatigue.  Hgb is down to 11.3 from 14.3. Platelet count is up at 577.   She has not had a cycle in several months.  No issues with bleeding. No bruising or petechiae.  No fever, chills, n/v, cough, rash, dizziness, SOB, chest pain, palpitations, abdominal pain or changes in bowel or bladder habits.  No swelling or tenderness in her extremities.  The neuropathy in her hands and feet has improved tremendously on Neurontin.  No falls or syncopal episodes to report.  She is still smoking.  She has a good appetite and is staying well hydrated. Her weight is stable.   ECOG Performance Status: 1 - Symptomatic but completely ambulatory  Medications:  Allergies as of 04/30/2019      Reactions   Eggs Or Egg-derived Products Nausea And Vomiting   When eats too much   Ibuprofen Other (See Comments)   stomache upset      Medication List       Accurate as of April 30, 2019  9:30 AM. If you have any questions, ask your nurse or doctor.        desmopressin 0.2 MG tablet Commonly known as: DDAVP Take 0.2 mg by mouth 2 (two) times daily. 09/02/18-BID per pt   Exforge 5-160 MG tablet Generic drug: amLODipine-valsartan 1 TABLET BY MOUTH DAILY FOR BLOOD PRESSURE--- takes in am after eaten   fluticasone 50 MCG/ACT nasal spray Commonly known as: FLONASE daily as needed.   folic acid 1 MG tablet Commonly known as: FOLVITE Take 1 mg by mouth daily.   gabapentin 800 MG tablet Commonly known as: NEURONTIN Take 800 mg by mouth 3 (three) times daily.    Humira Pen 40 MG/0.4ML Pnkt Generic drug: Adalimumab Every 2 weeks   Lexapro 20 MG tablet Generic drug: escitalopram Take 40 mg by mouth at bedtime. 2  20 mg tab = 40 mg daily   ondansetron 8 MG tablet Commonly known as: ZOFRAN TAKE 1 TABLET BY MOUTH 2 TIMES A DAY   oxyCODONE-acetaminophen 10-325 MG tablet Commonly known as: PERCOCET Take 1 tablet by mouth 4 (four) times daily.   pantoprazole 40 MG tablet Commonly known as: PROTONIX Take 40 mg by mouth daily.   potassium chloride SA 20 MEQ tablet Commonly known as: KLOR-CON Take 20 mEq by mouth every evening.   VITAMIN B 12 PO Take by mouth daily.   vitamin C 1000 MG tablet Take 1,000 mg by mouth daily.   Vitamin D3 125 MCG (5000 UT) Tabs Take by mouth every morning.   Xarelto 10 MG Tabs tablet Generic drug: rivaroxaban TAKE 1 TABLET BY MOUTH EVERY DAY WITH SUPPER   zinc gluconate 50 MG tablet Take 50 mg by mouth daily.       Allergies:  Allergies  Allergen Reactions  . Eggs Or Egg-Derived Products Nausea And Vomiting    When eats too much  . Ibuprofen Other (See Comments)    stomache upset    Past Medical History, Surgical history, Social history, and Family History were reviewed and updated.  Review of  Systems: All other 10 point review of systems is negative.   Physical Exam:  weight is 172 lb 8 oz (78.2 kg). Her tympanic temperature is 97 F (36.1 C) (abnormal). Her blood pressure is 139/66 and her pulse is 95. Her respiration is 18 and oxygen saturation is 95%.   Wt Readings from Last 3 Encounters:  04/30/19 172 lb 8 oz (78.2 kg)  01/07/19 176 lb (79.8 kg)  12/04/18 181 lb 14.4 oz (82.5 kg)    Ocular: Sclerae unicteric, pupils equal, round and reactive to light Ear-nose-throat: Oropharynx clear, dentition fair Lymphatic: No cervical or supraclavicular adenopathy Lungs no rales or rhonchi, good excursion bilaterally Heart regular rate and rhythm, no murmur appreciated Abd soft, nontender,  positive bowel sounds, no liver or spleen tip palpated on exam, no fluid wave  MSK no focal spinal tenderness, no joint edema Neuro: non-focal, well-oriented, appropriate affect Breasts: Deferred   Lab Results  Component Value Date   WBC 11.8 (H) 04/30/2019   HGB 11.3 (L) 04/30/2019   HCT 37.7 04/30/2019   MCV 85.7 04/30/2019   PLT 577 (H) 04/30/2019   Lab Results  Component Value Date   FERRITIN 106 01/07/2019   IRON 70 01/07/2019   TIBC 315 01/07/2019   UIBC 245 01/07/2019   IRONPCTSAT 22 01/07/2019   Lab Results  Component Value Date   RETICCTPCT 1.4 04/30/2019   RBC 4.40 04/30/2019   RBC 4.43 04/30/2019   RETICCTABS 88.8 12/21/2014   No results found for: KPAFRELGTCHN, LAMBDASER, KAPLAMBRATIO No results found for: IGGSERUM, IGA, IGMSERUM No results found for: Odetta Pink, SPEI   Chemistry      Component Value Date/Time   NA 136 01/07/2019 0941   NA 141 03/12/2017 0929   K 3.2 (L) 01/07/2019 0941   K 2.8 (LL) 03/12/2017 0929   CL 99 01/07/2019 0941   CL 106 12/08/2011 1145   CO2 27 01/07/2019 0941   CO2 27 03/12/2017 0929   BUN 10 01/07/2019 0941   BUN 9.4 03/12/2017 0929   CREATININE 1.28 (H) 01/07/2019 0941   CREATININE 1.2 (H) 03/12/2017 0929      Component Value Date/Time   CALCIUM 9.3 01/07/2019 0941   CALCIUM 9.5 03/12/2017 0929   ALKPHOS 92 01/07/2019 0941   ALKPHOS 125 03/12/2017 0929   AST 12 (L) 01/07/2019 0941   AST 13 03/12/2017 0929   ALT 9 01/07/2019 0941   ALT 9 03/12/2017 0929   BILITOT 0.2 (L) 01/07/2019 0941   BILITOT <0.22 03/12/2017 0929       Impression and Plan: Kari Hahn is a very pleasant 48 yo caucasian female withiron deficiency anemia due to GI blood loss with Crohn's and heavy menstrual bleeding. We will see how her iron counts look and replace if needed.  We will go ahead and plan to see her back in another 6 weeks.  She will contact our office with any questions  or concerns. We can certainly see her sooner if needed.   [name], NP 11/4/20209:30 AM

## 2019-04-30 NOTE — Telephone Encounter (Signed)
Called and LM for aptient with updated appointment schedule/letter/calendar mailed per 11/4 los

## 2019-04-30 NOTE — Telephone Encounter (Signed)
Called and spoke with patient regarding appointments added for iron infusion.  She was ok with both dates/times per 11/4 sch msg

## 2019-05-02 ENCOUNTER — Ambulatory Visit: Payer: BC Managed Care – PPO

## 2019-05-06 ENCOUNTER — Ambulatory Visit: Payer: BC Managed Care – PPO

## 2019-05-08 ENCOUNTER — Other Ambulatory Visit: Payer: Self-pay

## 2019-05-08 ENCOUNTER — Inpatient Hospital Stay: Payer: BC Managed Care – PPO

## 2019-05-08 VITALS — BP 132/67 | Temp 97.8°F | Resp 16

## 2019-05-08 DIAGNOSIS — D509 Iron deficiency anemia, unspecified: Secondary | ICD-10-CM

## 2019-05-08 DIAGNOSIS — I82401 Acute embolism and thrombosis of unspecified deep veins of right lower extremity: Secondary | ICD-10-CM | POA: Diagnosis not present

## 2019-05-08 DIAGNOSIS — D500 Iron deficiency anemia secondary to blood loss (chronic): Secondary | ICD-10-CM

## 2019-05-08 DIAGNOSIS — D510 Vitamin B12 deficiency anemia due to intrinsic factor deficiency: Secondary | ICD-10-CM

## 2019-05-08 MED ORDER — CYANOCOBALAMIN 1000 MCG/ML IJ SOLN
1000.0000 ug | Freq: Once | INTRAMUSCULAR | Status: AC
  Administered 2019-05-08: 1000 ug via INTRAMUSCULAR

## 2019-05-08 MED ORDER — CYANOCOBALAMIN 1000 MCG/ML IJ SOLN
INTRAMUSCULAR | Status: AC
  Filled 2019-05-08: qty 1

## 2019-05-08 MED ORDER — SODIUM CHLORIDE 0.9 % IV SOLN
510.0000 mg | Freq: Once | INTRAVENOUS | Status: AC
  Administered 2019-05-08: 510 mg via INTRAVENOUS
  Filled 2019-05-08: qty 17

## 2019-05-08 NOTE — Patient Instructions (Signed)
Ferumoxytol injection What is this medicine? FERUMOXYTOL is an iron complex. Iron is used to make healthy red blood cells, which carry oxygen and nutrients throughout the body. This medicine is used to treat iron deficiency anemia. This medicine may be used for other purposes; ask your health care provider or pharmacist if you have questions. COMMON BRAND NAME(S): Feraheme What should I tell my health care provider before I take this medicine? They need to know if you have any of these conditions:  anemia not caused by low iron levels  high levels of iron in the blood  magnetic resonance imaging (MRI) test scheduled  an unusual or allergic reaction to iron, other medicines, foods, dyes, or preservatives  pregnant or trying to get pregnant  breast-feeding How should I use this medicine? This medicine is for injection into a vein. It is given by a health care professional in a hospital or clinic setting. Talk to your pediatrician regarding the use of this medicine in children. Special care may be needed. Overdosage: If you think you have taken too much of this medicine contact a poison control center or emergency room at once. NOTE: This medicine is only for you. Do not share this medicine with others. What if I miss a dose? It is important not to miss your dose. Call your doctor or health care professional if you are unable to keep an appointment. What may interact with this medicine? This medicine may interact with the following medications:  other iron products This list may not describe all possible interactions. Give your health care provider a list of all the medicines, herbs, non-prescription drugs, or dietary supplements you use. Also tell them if you smoke, drink alcohol, or use illegal drugs. Some items may interact with your medicine. What should I watch for while using this medicine? Visit your doctor or healthcare professional regularly. Tell your doctor or healthcare  professional if your symptoms do not start to get better or if they get worse. You may need blood work done while you are taking this medicine. You may need to follow a special diet. Talk to your doctor. Foods that contain iron include: whole grains/cereals, dried fruits, beans, or peas, leafy green vegetables, and organ meats (liver, kidney). What side effects may I notice from receiving this medicine? Side effects that you should report to your doctor or health care professional as soon as possible:  allergic reactions like skin rash, itching or hives, swelling of the face, lips, or tongue  breathing problems  changes in blood pressure  feeling faint or lightheaded, falls  fever or chills  flushing, sweating, or hot feelings  swelling of the ankles or feet Side effects that usually do not require medical attention (report to your doctor or health care professional if they continue or are bothersome):  diarrhea  headache  nausea, vomiting  stomach pain This list may not describe all possible side effects. Call your doctor for medical advice about side effects. You may report side effects to FDA at 1-800-FDA-1088. Where should I keep my medicine? This drug is given in a hospital or clinic and will not be stored at home. NOTE: This sheet is a summary. It may not cover all possible information. If you have questions about this medicine, talk to your doctor, pharmacist, or health care provider.  2020 Elsevier/Gold Standard (2016-07-31 20:21:10)  Cyanocobalamin, Vitamin B12 injection What is this medicine? CYANOCOBALAMIN (sye an oh koe BAL a min) is a man made form of vitamin   B12. Vitamin B12 is used in the growth of healthy blood cells, nerve cells, and proteins in the body. It also helps with the metabolism of fats and carbohydrates. This medicine is used to treat people who can not absorb vitamin B12. This medicine may be used for other purposes; ask your health care provider or  pharmacist if you have questions. COMMON BRAND NAME(S): B-12 Compliance Kit, B-12 Injection Kit, Cyomin, LA-12, Nutri-Twelve, Physicians EZ Use B-12, Primabalt What should I tell my health care provider before I take this medicine? They need to know if you have any of these conditions:  kidney disease  Leber's disease  megaloblastic anemia  an unusual or allergic reaction to cyanocobalamin, cobalt, other medicines, foods, dyes, or preservatives  pregnant or trying to get pregnant  breast-feeding How should I use this medicine? This medicine is injected into a muscle or deeply under the skin. It is usually given by a health care professional in a clinic or doctor's office. However, your doctor may teach you how to inject yourself. Follow all instructions. Talk to your pediatrician regarding the use of this medicine in children. Special care may be needed. Overdosage: If you think you have taken too much of this medicine contact a poison control center or emergency room at once. NOTE: This medicine is only for you. Do not share this medicine with others. What if I miss a dose? If you are given your dose at a clinic or doctor's office, call to reschedule your appointment. If you give your own injections and you miss a dose, take it as soon as you can. If it is almost time for your next dose, take only that dose. Do not take double or extra doses. What may interact with this medicine?  colchicine  heavy alcohol intake This list may not describe all possible interactions. Give your health care provider a list of all the medicines, herbs, non-prescription drugs, or dietary supplements you use. Also tell them if you smoke, drink alcohol, or use illegal drugs. Some items may interact with your medicine. What should I watch for while using this medicine? Visit your doctor or health care professional regularly. You may need blood work done while you are taking this medicine. You may need to  follow a special diet. Talk to your doctor. Limit your alcohol intake and avoid smoking to get the best benefit. What side effects may I notice from receiving this medicine? Side effects that you should report to your doctor or health care professional as soon as possible:  allergic reactions like skin rash, itching or hives, swelling of the face, lips, or tongue  blue tint to skin  chest tightness, pain  difficulty breathing, wheezing  dizziness  red, swollen painful area on the leg Side effects that usually do not require medical attention (report to your doctor or health care professional if they continue or are bothersome):  diarrhea  headache This list may not describe all possible side effects. Call your doctor for medical advice about side effects. You may report side effects to FDA at 1-800-FDA-1088. Where should I keep my medicine? Keep out of the reach of children. Store at room temperature between 15 and 30 degrees C (59 and 85 degrees F). Protect from light. Throw away any unused medicine after the expiration date. NOTE: This sheet is a summary. It may not cover all possible information. If you have questions about this medicine, talk to your doctor, pharmacist, or health care provider.  2020 Elsevier/Gold   Standard (2007-09-23 22:10:20)   

## 2019-05-12 ENCOUNTER — Ambulatory Visit: Payer: BC Managed Care – PPO

## 2019-05-14 ENCOUNTER — Inpatient Hospital Stay: Payer: BC Managed Care – PPO

## 2019-05-14 ENCOUNTER — Other Ambulatory Visit: Payer: Self-pay

## 2019-05-14 VITALS — BP 124/70 | HR 81 | Temp 97.3°F | Resp 17

## 2019-05-14 DIAGNOSIS — D509 Iron deficiency anemia, unspecified: Secondary | ICD-10-CM

## 2019-05-14 DIAGNOSIS — I82401 Acute embolism and thrombosis of unspecified deep veins of right lower extremity: Secondary | ICD-10-CM | POA: Diagnosis not present

## 2019-05-14 DIAGNOSIS — D510 Vitamin B12 deficiency anemia due to intrinsic factor deficiency: Secondary | ICD-10-CM

## 2019-05-14 DIAGNOSIS — D500 Iron deficiency anemia secondary to blood loss (chronic): Secondary | ICD-10-CM

## 2019-05-14 MED ORDER — SODIUM CHLORIDE 0.9 % IV SOLN
510.0000 mg | Freq: Once | INTRAVENOUS | Status: AC
  Administered 2019-05-14: 510 mg via INTRAVENOUS
  Filled 2019-05-14: qty 510

## 2019-05-14 MED ORDER — SODIUM CHLORIDE 0.9 % IV SOLN
INTRAVENOUS | Status: DC
  Administered 2019-05-14: 09:00:00 via INTRAVENOUS
  Filled 2019-05-14: qty 250

## 2019-05-14 NOTE — Patient Instructions (Signed)

## 2019-05-14 NOTE — Progress Notes (Signed)
Pt. Refused to wait 30 minutes post infusion. Feels fine.

## 2019-05-20 ENCOUNTER — Other Ambulatory Visit: Payer: Self-pay | Admitting: Urology

## 2019-05-21 ENCOUNTER — Other Ambulatory Visit: Payer: Self-pay

## 2019-05-21 ENCOUNTER — Encounter (HOSPITAL_BASED_OUTPATIENT_CLINIC_OR_DEPARTMENT_OTHER): Payer: Self-pay | Admitting: *Deleted

## 2019-05-21 NOTE — Progress Notes (Addendum)
Spoke with patient via telephone for pre op interview. NPO after MN. Patient to take Protonix, Neurontin, Flonase and Percocet PRN AM of surgery with a sip of water. Will need UPT and IStat 8 AM of surgery. Arrival time 0630.  Patient does take Xarelto, per MD told to hold two days prior to surgery.

## 2019-05-24 ENCOUNTER — Other Ambulatory Visit (HOSPITAL_COMMUNITY)
Admission: RE | Admit: 2019-05-24 | Discharge: 2019-05-24 | Disposition: A | Discharge status: Home / Self Care | Payer: BC Managed Care – PPO | Source: Ambulatory Visit | Attending: Urology | Admitting: Urology

## 2019-05-24 DIAGNOSIS — Z20828 Contact with and (suspected) exposure to other viral communicable diseases: Secondary | ICD-10-CM | POA: Insufficient documentation

## 2019-05-24 DIAGNOSIS — Z01812 Encounter for preprocedural laboratory examination: Secondary | ICD-10-CM | POA: Insufficient documentation

## 2019-05-25 LAB — NOVEL CORONAVIRUS, NAA (HOSP ORDER, SEND-OUT TO REF LAB; TAT 18-24 HRS): SARS-CoV-2, NAA: NOT DETECTED

## 2019-05-27 NOTE — Anesthesia Preprocedure Evaluation (Addendum)
Anesthesia Evaluation  Patient identified by MRN, date of birth, ID band Patient awake    Reviewed: Allergy & Precautions, NPO status , Patient's Chart, lab work & pertinent test results  History of Anesthesia Complications Negative for: history of anesthetic complications  Airway Mallampati: II  TM Distance: >3 FB Neck ROM: Full    Dental  (+) Edentulous Upper, Edentulous Lower   Pulmonary Current Smoker,    Pulmonary exam normal        Cardiovascular hypertension, + DVT (10/2018, on Xarelto)  Normal cardiovascular exam     Neuro/Psych Anxiety Depression negative neurological ROS     GI/Hepatic Neg liver ROS, GERD  Medicated and Controlled,Crohn's   Endo/Other  negative endocrine ROS  Renal/GU Renal InsufficiencyRenal disease  negative genitourinary   Musculoskeletal  (+) Arthritis ,   Abdominal   Peds  Hematology   Anesthesia Other Findings Day of surgery medications reviewed with patient.  Reproductive/Obstetrics negative OB ROS                            Anesthesia Physical Anesthesia Plan  ASA: II  Anesthesia Plan: MAC   Post-op Pain Management:    Induction: Intravenous  PONV Risk Score and Plan: 2 and Treatment may vary due to age or medical condition, Ondansetron, Dexamethasone and Midazolam  Airway Management Planned: Natural Airway and Simple Face Mask  Additional Equipment: None  Intra-op Plan:   Post-operative Plan:   Informed Consent: I have reviewed the patients History and Physical, chart, labs and discussed the procedure including the risks, benefits and alternatives for the proposed anesthesia with the patient or authorized representative who has indicated his/her understanding and acceptance.     Dental advisory given  Plan Discussed with: CRNA  Anesthesia Plan Comments:       Anesthesia Quick Evaluation

## 2019-05-28 ENCOUNTER — Encounter (HOSPITAL_BASED_OUTPATIENT_CLINIC_OR_DEPARTMENT_OTHER)
Admission: RE | Disposition: A | Discharge status: Home / Self Care | Payer: Self-pay | Source: Home / Self Care | Attending: Urology

## 2019-05-28 ENCOUNTER — Other Ambulatory Visit: Payer: Self-pay

## 2019-05-28 ENCOUNTER — Ambulatory Visit (HOSPITAL_BASED_OUTPATIENT_CLINIC_OR_DEPARTMENT_OTHER): Payer: BC Managed Care – PPO | Admitting: Anesthesiology

## 2019-05-28 ENCOUNTER — Ambulatory Visit (HOSPITAL_BASED_OUTPATIENT_CLINIC_OR_DEPARTMENT_OTHER)
Admission: RE | Admit: 2019-05-28 | Discharge: 2019-05-28 | Disposition: A | Discharge status: Home / Self Care | Payer: BC Managed Care – PPO | Attending: Urology | Admitting: Urology

## 2019-05-28 ENCOUNTER — Encounter (HOSPITAL_BASED_OUTPATIENT_CLINIC_OR_DEPARTMENT_OTHER): Payer: Self-pay | Admitting: Emergency Medicine

## 2019-05-28 DIAGNOSIS — K219 Gastro-esophageal reflux disease without esophagitis: Secondary | ICD-10-CM | POA: Insufficient documentation

## 2019-05-28 DIAGNOSIS — I10 Essential (primary) hypertension: Secondary | ICD-10-CM | POA: Insufficient documentation

## 2019-05-28 DIAGNOSIS — E871 Hypo-osmolality and hyponatremia: Secondary | ICD-10-CM | POA: Diagnosis not present

## 2019-05-28 DIAGNOSIS — M1990 Unspecified osteoarthritis, unspecified site: Secondary | ICD-10-CM | POA: Diagnosis not present

## 2019-05-28 DIAGNOSIS — N3281 Overactive bladder: Secondary | ICD-10-CM | POA: Diagnosis not present

## 2019-05-28 DIAGNOSIS — N3944 Nocturnal enuresis: Secondary | ICD-10-CM | POA: Insufficient documentation

## 2019-05-28 DIAGNOSIS — Z9049 Acquired absence of other specified parts of digestive tract: Secondary | ICD-10-CM | POA: Diagnosis not present

## 2019-05-28 DIAGNOSIS — F17200 Nicotine dependence, unspecified, uncomplicated: Secondary | ICD-10-CM | POA: Insufficient documentation

## 2019-05-28 DIAGNOSIS — F329 Major depressive disorder, single episode, unspecified: Secondary | ICD-10-CM | POA: Insufficient documentation

## 2019-05-28 DIAGNOSIS — K5090 Crohn's disease, unspecified, without complications: Secondary | ICD-10-CM | POA: Diagnosis not present

## 2019-05-28 DIAGNOSIS — Z79899 Other long term (current) drug therapy: Secondary | ICD-10-CM | POA: Diagnosis not present

## 2019-05-28 DIAGNOSIS — Z7901 Long term (current) use of anticoagulants: Secondary | ICD-10-CM | POA: Insufficient documentation

## 2019-05-28 DIAGNOSIS — Z86718 Personal history of other venous thrombosis and embolism: Secondary | ICD-10-CM | POA: Diagnosis not present

## 2019-05-28 DIAGNOSIS — Z87442 Personal history of urinary calculi: Secondary | ICD-10-CM | POA: Diagnosis not present

## 2019-05-28 DIAGNOSIS — F419 Anxiety disorder, unspecified: Secondary | ICD-10-CM | POA: Insufficient documentation

## 2019-05-28 DIAGNOSIS — N3941 Urge incontinence: Secondary | ICD-10-CM | POA: Insufficient documentation

## 2019-05-28 DIAGNOSIS — Z8249 Family history of ischemic heart disease and other diseases of the circulatory system: Secondary | ICD-10-CM | POA: Insufficient documentation

## 2019-05-28 DIAGNOSIS — E7800 Pure hypercholesterolemia, unspecified: Secondary | ICD-10-CM | POA: Diagnosis not present

## 2019-05-28 HISTORY — PX: BOTOX INJECTION: SHX5754

## 2019-05-28 LAB — POCT I-STAT, CHEM 8
BUN: 12 mg/dL (ref 6–20)
Calcium, Ion: 1.39 mmol/L (ref 1.15–1.40)
Chloride: 101 mmol/L (ref 98–111)
Creatinine, Ser: 1.5 mg/dL — ABNORMAL HIGH (ref 0.44–1.00)
Glucose, Bld: 97 mg/dL (ref 70–99)
HCT: 42 % (ref 36.0–46.0)
Hemoglobin: 14.3 g/dL (ref 12.0–15.0)
Potassium: 4.3 mmol/L (ref 3.5–5.1)
Sodium: 136 mmol/L (ref 135–145)
TCO2: 28 mmol/L (ref 22–32)

## 2019-05-28 LAB — POCT PREGNANCY, URINE: Preg Test, Ur: NEGATIVE

## 2019-05-28 SURGERY — BOTOX INJECTION
Anesthesia: General

## 2019-05-28 MED ORDER — ACETAMINOPHEN 500 MG PO TABS
1000.0000 mg | ORAL_TABLET | Freq: Once | ORAL | Status: AC
  Administered 2019-05-28: 500 mg via ORAL
  Filled 2019-05-28: qty 2

## 2019-05-28 MED ORDER — CEPHALEXIN 500 MG PO CAPS: 500.0000 mg | ORAL_CAPSULE | Freq: Two times a day (BID) | ORAL | 0 refills | Status: AC

## 2019-05-28 MED ORDER — OXYCODONE HCL 5 MG PO TABS
5.0000 mg | ORAL_TABLET | Freq: Once | ORAL | Status: DC | PRN
  Filled 2019-05-28: qty 1

## 2019-05-28 MED ORDER — LIDOCAINE 2% (20 MG/ML) 5 ML SYRINGE
INTRAMUSCULAR | Status: AC
  Filled 2019-05-28: qty 5

## 2019-05-28 MED ORDER — ONABOTULINUMTOXINA 100 UNITS IJ SOLR
INTRAMUSCULAR | Status: DC | PRN
  Administered 2019-05-28: 200 [IU] via INTRAMUSCULAR

## 2019-05-28 MED ORDER — LIDOCAINE 2% (20 MG/ML) 5 ML SYRINGE
INTRAMUSCULAR | Status: DC | PRN
  Administered 2019-05-28: 40 mg via INTRAVENOUS

## 2019-05-28 MED ORDER — LACTATED RINGERS IV SOLN
INTRAVENOUS | Status: DC
  Administered 2019-05-28: 08:00:00 via INTRAVENOUS
  Filled 2019-05-28: qty 1000

## 2019-05-28 MED ORDER — SODIUM CHLORIDE (PF) 0.9 % IJ SOLN
INTRAMUSCULAR | Status: DC | PRN
  Administered 2019-05-28: 20 mL

## 2019-05-28 MED ORDER — FENTANYL CITRATE (PF) 100 MCG/2ML IJ SOLN
INTRAMUSCULAR | Status: AC
  Filled 2019-05-28: qty 2

## 2019-05-28 MED ORDER — OXYCODONE HCL 5 MG/5ML PO SOLN
5.0000 mg | Freq: Once | ORAL | Status: DC | PRN
  Filled 2019-05-28: qty 5

## 2019-05-28 MED ORDER — CEFAZOLIN SODIUM-DEXTROSE 2-4 GM/100ML-% IV SOLN
2.0000 g | INTRAVENOUS | Status: AC
  Administered 2019-05-28: 2 g via INTRAVENOUS
  Filled 2019-05-28: qty 100

## 2019-05-28 MED ORDER — FENTANYL CITRATE (PF) 100 MCG/2ML IJ SOLN
25.0000 ug | INTRAMUSCULAR | Status: DC | PRN
  Filled 2019-05-28: qty 1

## 2019-05-28 MED ORDER — MIDAZOLAM HCL 2 MG/2ML IJ SOLN
INTRAMUSCULAR | Status: AC
  Filled 2019-05-28: qty 2

## 2019-05-28 MED ORDER — PROPOFOL 10 MG/ML IV BOLUS
INTRAVENOUS | Status: AC
  Filled 2019-05-28: qty 40

## 2019-05-28 MED ORDER — CEFAZOLIN SODIUM-DEXTROSE 2-4 GM/100ML-% IV SOLN
INTRAVENOUS | Status: AC
  Filled 2019-05-28: qty 100

## 2019-05-28 MED ORDER — ONDANSETRON HCL 4 MG/2ML IJ SOLN
INTRAMUSCULAR | Status: DC | PRN
  Administered 2019-05-28: 4 mg via INTRAVENOUS

## 2019-05-28 MED ORDER — PROPOFOL 10 MG/ML IV BOLUS
INTRAVENOUS | Status: DC | PRN
  Administered 2019-05-28 (×2): 30 mg via INTRAVENOUS
  Administered 2019-05-28 (×2): 20 mg via INTRAVENOUS

## 2019-05-28 MED ORDER — FENTANYL CITRATE (PF) 100 MCG/2ML IJ SOLN
INTRAMUSCULAR | Status: DC | PRN
  Administered 2019-05-28 (×2): 25 ug via INTRAVENOUS

## 2019-05-28 MED ORDER — MIDAZOLAM HCL 2 MG/2ML IJ SOLN
INTRAMUSCULAR | Status: DC | PRN
  Administered 2019-05-28: 2 mg via INTRAVENOUS

## 2019-05-28 MED ORDER — PROMETHAZINE HCL 25 MG/ML IJ SOLN
6.2500 mg | INTRAMUSCULAR | Status: DC | PRN
  Filled 2019-05-28: qty 1

## 2019-05-28 MED ORDER — ACETAMINOPHEN 500 MG PO TABS
ORAL_TABLET | ORAL | Status: AC
  Filled 2019-05-28: qty 2

## 2019-05-28 MED ORDER — ONDANSETRON HCL 4 MG/2ML IJ SOLN
INTRAMUSCULAR | Status: AC
  Filled 2019-05-28: qty 2

## 2019-05-28 MED ORDER — DEXAMETHASONE SODIUM PHOSPHATE 10 MG/ML IJ SOLN
INTRAMUSCULAR | Status: AC
  Filled 2019-05-28: qty 1

## 2019-05-28 SURGICAL SUPPLY — 20 items
BAG DRAIN URO-CYSTO SKYTR STRL (DRAIN) ×3 IMPLANT
CATH ROBINSON RED A/P 14FR (CATHETERS) IMPLANT
CLOTH BEACON ORANGE TIMEOUT ST (SAFETY) ×3 IMPLANT
ELECT REM PT RETURN 9FT ADLT (ELECTROSURGICAL)
ELECTRODE REM PT RTRN 9FT ADLT (ELECTROSURGICAL) ×1 IMPLANT
GLOVE BIO SURGEON STRL SZ7.5 (GLOVE) ×3 IMPLANT
GOWN STRL REUS W/ TWL XL LVL3 (GOWN DISPOSABLE) ×3 IMPLANT
GOWN STRL REUS W/TWL XL LVL3 (GOWN DISPOSABLE) ×6
KIT TURNOVER CYSTO (KITS) ×3 IMPLANT
MANIFOLD NEPTUNE II (INSTRUMENTS) ×3 IMPLANT
NDL ASPIRATION 22 (NEEDLE) IMPLANT
NDL HYPO 18GX1.5 BLUNT FILL (NEEDLE) IMPLANT
NEEDLE ASPIRATION 22 (NEEDLE) IMPLANT
NEEDLE HYPO 18GX1.5 BLUNT FILL (NEEDLE) IMPLANT
PACK CYSTO (CUSTOM PROCEDURE TRAY) ×3 IMPLANT
SYR 20ML LL LF (SYRINGE) IMPLANT
SYR CONTROL 10ML LL (SYRINGE) IMPLANT
TUBE CONNECTING 12'X1/4 (SUCTIONS) ×1
TUBE CONNECTING 12X1/4 (SUCTIONS) ×2 IMPLANT
WATER STERILE IRR 3000ML UROMA (IV SOLUTION) ×3 IMPLANT

## 2019-05-28 NOTE — Discharge Instructions (Signed)
Post Anesthesia Home Care Instructions  Activity: Get plenty of rest for the remainder of the day. A responsible individual must stay with you for 24 hours following the procedure.  For the next 24 hours, DO NOT: -Drive a car -Paediatric nurse -Drink alcoholic beverages -Take any medication unless instructed by your physician -Make any legal decisions or sign important papers.  Meals: Start with liquid foods such as gelatin or soup. Progress to regular foods as tolerated. Avoid greasy, spicy, heavy foods. If nausea and/or vomiting occur, drink only clear liquids until the nausea and/or vomiting subsides. Call your physician if vomiting continues.  Special Instructions/Symptoms: Your throat may feel dry or sore from the anesthesia or the breathing tube placed in your throat during surgery. If this causes discomfort, gargle with warm salt water. The discomfort should disappear within 24 hours.  If you had a scopolamine patch placed behind your ear for the management of post- operative nausea and/or vomiting:  1. The medication in the patch is effective for 72 hours, after which it should be removed.  Wrap patch in a tissue and discard in the trash. Wash hands thoroughly with soap and water. 2. You may remove the patch earlier than 72 hours if you experience unpleasant side effects which may include dry mouth, dizziness or visual disturbances. 3. Avoid touching the patch. Wash your hands with soap and water after contact with the patch.  Cystoscopy  What can I expect after the procedure? After the procedure, it is common to have:  Some soreness or pain in your abdomen and urethra.  Urinary symptoms. These include: ? Mild pain or burning when you urinate. Pain should stop within a few minutes after you urinate. This may last for up to 1 week. ? A small amount of blood in your urine for several days. ? Feeling like you need to urinate but producing only a small amount of urine. Follow  these instructions at home: Medicines  Take over-the-counter and prescription medicines only as told by your health care provider.  If you were prescribed an antibiotic medicine, take it as told by your health care provider. Do not stop taking the antibiotic even if you start to feel better. General instructions  Return to your normal activities as told by your health care provider. Ask your health care provider what activities are safe for you.  Do not drive for 24 hours if you were given a sedative during your procedure.  Watch for any blood in your urine. If the amount of blood in your urine increases, call your health care provider.  Follow instructions from your health care provider about eating or drinking restrictions.  If a tissue sample was removed for testing (biopsy) during your procedure, it is up to you to get your test results. Ask your health care provider, or the department that is doing the test, when your results will be ready.  Drink enough fluid to keep your urine pale yellow.  Keep all follow-up visits as told by your health care provider. This is important. Contact a health care provider if you:  Have pain that gets worse or does not get better with medicine, especially pain when you urinate.  Have trouble urinating.  Have more blood in your urine. Get help right away if you:  Have blood clots in your urine.  Have abdominal pain.  Have a fever or chills.  Are unable to urinate. Summary  Cystoscopy is a procedure that is used to help diagnose and  sometimes treat conditions that affect the lower urinary tract.  Cystoscopy is done using a thin, tube-shaped instrument with a light and camera at the end.  After the procedure, it is common to have some soreness or pain in your abdomen and urethra.  Watch for any blood in your urine. If the amount of blood in your urine increases, call your health care provider.  If you were prescribed an antibiotic  medicine, take it as told by your health care provider. Do not stop taking the antibiotic even if you start to feel better. This information is not intended to replace advice given to you by your health care provider. Make sure you discuss any questions you have with your health care provider. Document Released: 06/09/2000 Document Revised: 06/04/2018 Document Reviewed: 06/04/2018 Elsevier Patient Education  2020 [employer].

## 2019-05-28 NOTE — Op Note (Signed)
Operative Note  Preoperative diagnosis:  1.  Refractory urge incontinence  Postoperative diagnosis: 1.  Refractory urge incontinence  Procedure(s): 1.  Cystoscopy with bladder Botox injections (200 units)  Surgeon: Ellison Hughs, MD  Assistants:  None  Anesthesia:  General  Complications:  None  EBL: Less than 5 mL  Specimens: 1.  None  Drains/Catheters: 1.  None  Intraoperative findings:   1. No intravesical pathology was identified during a complete cystourethroscopy  Indication:  Kari Hahn is a 48 y.o. female with refractory urge incontinence alleviated with intermittent bladder Botox injections.  The risk, benefits and alternatives of the above procedures was discussed in detail.  She voices understanding and wishes to proceed.    Description of procedure:  After informed consent was obtained, the patient was brought to the operating room and general LMA anesthesia was administered. The patient was then placed in the dorsolithotomy position and prepped and draped in the usual sterile fashion. A timeout was performed. A 23 French rigid cystoscope was then inserted into the urethral meatus and advanced into the bladder under direct vision. A complete bladder survey revealed no intravesical pathology.  I then injected a total of 200 units of Botox diluted in 20 mL of saline in 0.5 mL aliquots throughout the detrusor musculature, and a grid like fashion.  There was no evidence of bleeding following the injections.  The patient's bladder was then partially drained.  She tolerated the procedure well and was transferred to the postanesthesia in stable condition.  Plan: Office visit in 2 weeks with PVR

## 2019-05-28 NOTE — Anesthesia Postprocedure Evaluation (Signed)
Anesthesia Post Note  Patient: Kari Hahn  Procedure(s) Performed: BOTOX INJECTION WITH CYSTOSCOPY/BOTOX 200 UNITS (N/A )     Patient location during evaluation: PACU Anesthesia Type: MAC Level of consciousness: awake and alert and oriented Pain management: pain level controlled Vital Signs Assessment: post-procedure vital signs reviewed and stable Respiratory status: spontaneous breathing, nonlabored ventilation and respiratory function stable Cardiovascular status: blood pressure returned to baseline Postop Assessment: no apparent nausea or vomiting Anesthetic complications: no    Last Vitals:  Vitals:   05/28/19 0930 05/28/19 0945  BP: 126/63 122/69  Pulse: 63 70  Resp: 10 13  Temp:    SpO2: 92% 95%    Last Pain:  Vitals:   05/28/19 0945  TempSrc:   PainSc: 0-No pain                 Brennan Bailey

## 2019-05-28 NOTE — Anesthesia Procedure Notes (Signed)
Procedure Name: MAC Date/Time: 05/28/2019 8:34 AM Performed by: Mechele Claude, CRNA Pre-anesthesia Checklist: Patient identified, Emergency Drugs available, Suction available and Patient being monitored Oxygen Delivery Method: Simple face mask Placement Confirmation: positive ETCO2,  CO2 detector and breath sounds checked- equal and bilateral

## 2019-05-28 NOTE — Transfer of Care (Signed)
   Last Vitals:  Vitals Value Taken Time  BP 122/69 05/28/19 0945  Temp 37 C 05/28/19 0909  Pulse 61 05/28/19 0946  Resp 18 05/28/19 0946  SpO2 96 % 05/28/19 0946  Vitals shown include unvalidated device data.  Last Pain:  Vitals:   05/28/19 0945  TempSrc:   PainSc: 0-No pain      Patients Stated Pain Goal: 6 (05/28/19 0805)  Immediate Anesthesia Transfer of Care Note  Patient: Kari Hahn  Procedure(s) Performed: Procedure(s) (LRB): BOTOX INJECTION WITH CYSTOSCOPY/BOTOX 200 UNITS (N/A)  Patient Location: PACU  Anesthesia Type:MAC  Level of Consciousness: awake, alert  and oriented  Airway & Oxygen Therapy: Patient Spontanous Breathing and Patient connected to face mask oxygen  Post-op Assessment: Report given to PACU RN and Post -op Vital signs reviewed and stable  Post vital signs: Reviewed and stable  Complications: No apparent anesthesia complications

## 2019-05-28 NOTE — H&P (Signed)
PRE-OP H&P  Office Visit Report     05/15/2019   --------------------------------------------------------------------------------   Kari Hahn  MRN: [id number]  DOB: 1971/01/14, 48 year old Female    PRIMARY CARE:  Sandi Mariscal, MD  REFERRING:  Sandi Mariscal, MD  PROVIDER:  Ellison Hughs, M.D.  LOCATION:  [facility] [phone]     --------------------------------------------------------------------------------   CC/HPI: CC: Overactive bladder   HPI: Kari Hahn is a 49 year old female with a history of kidney stones, mixed urinary incontinence, nocturnal enuresis and Crohn's disease.   UDS from 04/08/2018 showed a hypersensitive bladder with a very low filling capacity at 100 ml.   06/25/18: The patient is status post cystoscopy with Botox injections on 06/11/2018. She is here today for a routine postop appointment. She reports that she is still having episodes of nocturnal enuresis. Daytime urgency and frequency has improved to a degree. She is still having episodes of SUI during the day, but is not bothered by it. Overall, she is pleased with the improvement in her daytime urgency, but is still frustrated by her unconscious voiding at night time.   08/29/18: Kari Hahn is here today for routine follow-up after starting 0.2 mg of desmopressin for nocturnal enuresis. She reports moderate improvement in her noctural enuresis--wakes up wet every 1-3 nights, which is improved from nightly episodes. Her urgency has progressively improved since her botox injection and she reports that she seldomly has daytime episodes of incontinence. Denies interval UTIs, dysuria or hematuria. UA today shows sings of cystitis, but she is asymptomatic.   11/04/18: Improved nocturia since starting DDAVP. Sodium levels are WNL. Planning to have a hysterectomy in the near future due to abnormal vaginal bleeding.   01/03/19: Kari Hahn is here today for a routine f/u after bladder botox injections on  12/04/18. She reports a febrile UTI 1 week after her surgery and required a one week course of Bactrim. No cx data available. Today, she reports resolution of her IVS and that her urine appears clear. UA today is WNL. She continues to take DDAVP and reports nocturia x 0-1, which is markedly improved.   05/15/19: The patient is here today for a routine follow-up. The patient was found to be hyponatremic on her last BMP with a sodium level of 126. I instructed her to reduce her DDAVP dose to every other day. Today, she reports a recent episode of dysuria and left flank pain that have since resolved since starting Macrobid. Denies N/V/F/C or hematuria. She states worsening urge incontinence and nocturnal enuresis over the past 2-3 weeks.     ALLERGIES: None   MEDICATIONS: Desmopressin Acetate 0.2 mg tablet 2 tablet PO Q HS  Percocet  4-Aminobenzoic Acid Potassium  Acid Reducer  Exforge  Folic Acid 1 mg tablet  Gabapentin  Humira 1 PO Daily  Lexapro 20 mg tablet  Potassium Chloride  Vitamin B Complex  Vitamin C 1,000 mg tablet  Vitamin D3 125 mcg (5,000 unit) capsule  Xarelto  Zinc  Zofran 8 mg tablet     GU PSH: Complex cystometrogram, w/ void pressure and urethral pressure profile studies, any technique - 04/08/2018 Complex Uroflow - 04/08/2018 Cysto Remove Stent FB Sim - 2019 Cystoscopy - 05/10/2018 Cystourethroscopy, W/Injection For Chemodenervation Of Bladder - 12/04/2018, 06/11/2018 Emg surf Electrd - 04/08/2018 Inject For cystogram - 04/08/2018 Intrabd voidng Press - 04/08/2018 Ureteroscopic laser litho, Left - 2019     NON-GU PSH: Cesarean Delivery - 1998, 1992 Knee Arthroscopy - 1983 Remove  Gallbladder - 2011     GU PMH: Nocturnal Enuresis - 06/25/2018 History of urolithiasis - 05/10/2018 Mixed incontinence (Stable) - 03/15/2018 Urinary Urgency (Stable) - 03/15/2018 Acute Cystitis/UTI - 12/14/2017 Hydronephrosis, Left - 2019 Renal calculus, Bilateral - 2019 Ureteral  calculus, Left, 7 mm left UPJ calculus - 2019    NON-GU PMH: Crohn''s disease of small intestine without complications - 12/25/6376 Anxiety Arthritis Depression GERD Hypercholesterolemia Hypertension    FAMILY HISTORY: Hypertension - Grandfather, Mother   SOCIAL HISTORY: Marital Status: Married Preferred Language: English; Ethnicity: Not Hispanic Or Latino; Race: White Current Smoking Status: Patient smokes. Has smoked since 07/27/1985. Smokes 2 packs per day.   Tobacco Use Assessment Completed: Used Tobacco in last 30 days? Does not use smokeless tobacco. Has never drank.  Drinks 4+ caffeinated drinks per day. Has not had a blood transfusion.    REVIEW OF SYSTEMS:    GU Review Female:   Patient reports frequent urination, hard to postpone urination, get up at night to urinate, and leakage of urine. Patient denies burning /pain with urination, stream starts and stops, trouble starting your stream, have to strain to urinate, and being pregnant.  Gastrointestinal (Upper):   Patient denies nausea, vomiting, and indigestion/ heartburn.  Gastrointestinal (Lower):   Patient reports diarrhea. Patient denies constipation.  Constitutional:   Patient denies fever, night sweats, weight loss, and fatigue.  Skin:   Patient denies skin rash/ lesion and itching.  Eyes:   Patient denies blurred vision and double vision.  Ears/ Nose/ Throat:   Patient denies sore throat and sinus problems.  Hematologic/Lymphatic:   Patient denies swollen glands and easy bruising.  Cardiovascular:   Patient reports chest pains. Patient denies leg swelling.  Respiratory:   Patient reports shortness of breath. Patient denies cough.  Endocrine:   Patient denies excessive thirst.  Musculoskeletal:   Patient reports back pain and joint pain.   Neurological:   Patient denies headaches and dizziness.  Psychologic:   Patient denies depression and anxiety.   VITAL SIGNS:      05/15/2019 08:54 AM  Height 62 in / 157.48  cm  BP 135/72 mmHg  Heart Rate 109 /min  Temperature 97.9 F / 36.6 C   MULTI-SYSTEM PHYSICAL EXAMINATION:    Constitutional: Well-nourished. No physical deformities. Normally developed. Good grooming.  Neck: Neck symmetrical, not swollen. Normal tracheal position.  Respiratory: No labored breathing, no use of accessory muscles.   Cardiovascular: Normal temperature, normal extremity pulses, no swelling, no varicosities.  Skin: No paleness, no jaundice, no cyanosis. No lesion, no ulcer, no rash.  Neurologic / Psychiatric: Oriented to time, oriented to place, oriented to person. No depression, no anxiety, no agitation.  Gastrointestinal: No mass, no tenderness, no rigidity, non obese abdomen.  Eyes: Normal conjunctivae. Normal eyelids.  Musculoskeletal: Normal gait and station of head and neck.     PAST DATA REVIEWED:  Source Of History:  Patient   PROCEDURES:          Urinalysis w/Scope Dipstick Dipstick Cont'd Micro  Color: Yellow Bilirubin: Neg mg/dL WBC/hpf: 40 - 60/hpf  Appearance: Clear Ketones: Neg mg/dL RBC/hpf: NS (Not Seen)  Specific Gravity: 1.010 Blood: Neg ery/uL Bacteria: Few (10-25/hpf)  pH: 6.5 Protein: Trace mg/dL Cystals: NS (Not Seen)  Glucose: Neg mg/dL Urobilinogen: 0.2 mg/dL Casts: NS (Not Seen)    Nitrites: Neg Trichomonas: Not Present    Leukocyte Esterase: 1+ leu/uL Mucous: Not Present      Epithelial Cells: 6 - 10/hpf  Yeast: NS (Not Seen)      Sperm: Not Present    ASSESSMENT:      ICD-10 Details  1 GU:   Mixed incontinence - N39.46   2   Nocturnal Enuresis - N39.44    PLAN:            Medications New Meds: Macrobid 100 mg capsule 1 capsule PO BID   #10  0 Refill(s)            Schedule Return Visit/Planned Activity: ASAP - Schedule Surgery          Document Letter(s):  Created for Patient: Clinical Summary         Notes:    -Schedule cysto with botox injections (200 units). Risks discussed  -Start taking 0.2 mg of DDAVP. Will  recheck a BMP pre-op  -Start macrobid for suspected UTI (urine cx shows mixed growth)

## 2019-05-29 ENCOUNTER — Encounter (HOSPITAL_BASED_OUTPATIENT_CLINIC_OR_DEPARTMENT_OTHER): Payer: Self-pay | Admitting: Urology

## 2019-06-10 ENCOUNTER — Other Ambulatory Visit: Payer: Self-pay

## 2019-06-10 ENCOUNTER — Inpatient Hospital Stay: Payer: BC Managed Care – PPO | Attending: Family

## 2019-06-10 DIAGNOSIS — I82431 Acute embolism and thrombosis of right popliteal vein: Secondary | ICD-10-CM

## 2019-06-10 DIAGNOSIS — Z7901 Long term (current) use of anticoagulants: Secondary | ICD-10-CM | POA: Diagnosis not present

## 2019-06-10 DIAGNOSIS — D500 Iron deficiency anemia secondary to blood loss (chronic): Secondary | ICD-10-CM | POA: Insufficient documentation

## 2019-06-10 DIAGNOSIS — D510 Vitamin B12 deficiency anemia due to intrinsic factor deficiency: Secondary | ICD-10-CM | POA: Insufficient documentation

## 2019-06-10 DIAGNOSIS — K922 Gastrointestinal hemorrhage, unspecified: Secondary | ICD-10-CM | POA: Diagnosis not present

## 2019-06-10 DIAGNOSIS — I82401 Acute embolism and thrombosis of unspecified deep veins of right lower extremity: Secondary | ICD-10-CM | POA: Insufficient documentation

## 2019-06-10 LAB — RETICULOCYTES
Immature Retic Fract: 22.1 % — ABNORMAL HIGH (ref 2.3–15.9)
RBC.: 4.75 MIL/uL (ref 3.87–5.11)
Retic Count, Absolute: 94.5 10*3/uL (ref 19.0–186.0)
Retic Ct Pct: 2 % (ref 0.4–3.1)

## 2019-06-10 LAB — CBC WITH DIFFERENTIAL (CANCER CENTER ONLY)
Abs Immature Granulocytes: 0.12 10*3/uL — ABNORMAL HIGH (ref 0.00–0.07)
Basophils Absolute: 0.1 10*3/uL (ref 0.0–0.1)
Basophils Relative: 1 %
Eosinophils Absolute: 0.3 10*3/uL (ref 0.0–0.5)
Eosinophils Relative: 3 %
HCT: 43.3 % (ref 36.0–46.0)
Hemoglobin: 13.8 g/dL (ref 12.0–15.0)
Immature Granulocytes: 1 %
Lymphocytes Relative: 20 %
Lymphs Abs: 1.9 10*3/uL (ref 0.7–4.0)
MCH: 29.1 pg (ref 26.0–34.0)
MCHC: 31.9 g/dL (ref 30.0–36.0)
MCV: 91.2 fL (ref 80.0–100.0)
Monocytes Absolute: 0.8 10*3/uL (ref 0.1–1.0)
Monocytes Relative: 9 %
Neutro Abs: 6.1 10*3/uL (ref 1.7–7.7)
Neutrophils Relative %: 66 %
Platelet Count: 378 10*3/uL (ref 150–400)
RBC: 4.75 MIL/uL (ref 3.87–5.11)
RDW: 26.4 % — ABNORMAL HIGH (ref 11.5–15.5)
WBC Count: 9.3 10*3/uL (ref 4.0–10.5)
nRBC: 0 % (ref 0.0–0.2)

## 2019-06-11 ENCOUNTER — Telehealth: Payer: Self-pay | Admitting: Family

## 2019-06-11 ENCOUNTER — Inpatient Hospital Stay (HOSPITAL_BASED_OUTPATIENT_CLINIC_OR_DEPARTMENT_OTHER): Payer: BC Managed Care – PPO | Admitting: Family

## 2019-06-11 ENCOUNTER — Inpatient Hospital Stay: Payer: BC Managed Care – PPO

## 2019-06-11 DIAGNOSIS — I82401 Acute embolism and thrombosis of unspecified deep veins of right lower extremity: Secondary | ICD-10-CM | POA: Diagnosis not present

## 2019-06-11 DIAGNOSIS — D500 Iron deficiency anemia secondary to blood loss (chronic): Secondary | ICD-10-CM

## 2019-06-11 LAB — IRON AND TIBC
Iron: 64 ug/dL (ref 41–142)
Saturation Ratios: 21 % (ref 21–57)
TIBC: 301 ug/dL (ref 236–444)
UIBC: 238 ug/dL (ref 120–384)

## 2019-06-11 LAB — FERRITIN: Ferritin: 175 ng/mL (ref 11–307)

## 2019-06-11 NOTE — Progress Notes (Signed)
Hematology and Oncology Follow Up Visit  Kari Hahn [id number] [birth year] 48 y.o. 06/11/2019   Principle Diagnosis:  Right lower extremity DVT Idiopathic aortic thrombosis Pernicious anemia Iron deficiency anemia Crohn's disease  Current Therapy:   Xarelto 20 mg PO daily Vitamin B12 PO BID daily IV iron as needed   Interim History:  Kari Hahn had a telephone visit today for follow-up., She is doing well but still has some mild fatigue and occasional dizziness Still no cycle since September. She has occasional hot flashes and night sweats off and on.  She has had some blood in her stool off and on. She has history of Crohn's disease and hemorrhoids. She is having several semi formed stools a day. She has an appointment with GI next week on 12/22 for further eval.  She has not noted any other blood loss.  She did well with her IV iron infusions last month. Iron saturation is up to 21% and ferritin 175. Hgb 13.8, MCV 91 and platelet count 378.  NO fever, chills, n/v, cough, rash, SOB, chest pain, palpitations, abdominal pain or changes in bladder habits.  Tingling in left arm and hand off and on.  No swelling or tenderness in her extremities.  No falls or syncopal episodes.  She is eating well and hydrating appropriately. Her weight is described as stable.   ECOG Performance Status: 1 - Symptomatic but completely ambulatory  Medications:  Allergies as of 06/11/2019      Reactions   Ibuprofen Other (See Comments)   stomache upset      Medication List       Accurate as of June 11, 2019 10:03 AM. If you have any questions, ask your nurse or doctor.        desmopressin 0.2 MG tablet Commonly known as: DDAVP Take 0.2 mg by mouth 2 (two) times daily. 09/02/18-BID per pt   Exforge 5-160 MG tablet Generic drug: amLODipine-valsartan 1 TABLET BY MOUTH DAILY FOR BLOOD PRESSURE--- takes in am after eaten   fluticasone 50 MCG/ACT nasal spray Commonly known as:  FLONASE daily as needed.   folic acid 1 MG tablet Commonly known as: FOLVITE Take 1 mg by mouth daily.   gabapentin 800 MG tablet Commonly known as: NEURONTIN Take 800 mg by mouth 3 (three) times daily.   Humira Pen 40 MG/0.4ML Pnkt Generic drug: Adalimumab Every 2 weeks   Lexapro 20 MG tablet Generic drug: escitalopram Take 40 mg by mouth at bedtime. 2  20 mg tab = 40 mg daily   ondansetron 8 MG tablet Commonly known as: ZOFRAN TAKE 1 TABLET BY MOUTH 2 TIMES A DAY   oxyCODONE-acetaminophen 10-325 MG tablet Commonly known as: PERCOCET Take 1 tablet by mouth 4 (four) times daily.   pantoprazole 40 MG tablet Commonly known as: PROTONIX Take 40 mg by mouth daily.   potassium chloride SA 20 MEQ tablet Commonly known as: KLOR-CON Take 20 mEq by mouth every evening.   VITAMIN B 12 PO Take by mouth daily.   vitamin C 1000 MG tablet Take 1,000 mg by mouth daily.   Vitamin D3 125 MCG (5000 UT) Tabs Take by mouth every morning.   Xarelto 10 MG Tabs tablet Generic drug: rivaroxaban TAKE 1 TABLET BY MOUTH EVERY DAY WITH SUPPER   zinc gluconate 50 MG tablet Take 50 mg by mouth daily.       Allergies:  Allergies  Allergen Reactions  . Ibuprofen Other (See Comments)    stomache upset  Past Medical History, Surgical history, Social history, and Family History were reviewed and updated.  Review of Systems: All other 10 point review of systems is negative.   Physical Exam:  vitals were not taken for this visit.   Wt Readings from Last 3 Encounters:  05/28/19 174 lb 2 oz (79 kg)  04/30/19 172 lb 8 oz (78.2 kg)  01/07/19 176 lb (79.8 kg)    Ocular: Sclerae unicteric, pupils equal, round and reactive to light Ear-nose-throat: Oropharynx clear, dentition fair Lymphatic: No cervical or supraclavicular adenopathy Lungs no rales or rhonchi, good excursion bilaterally Heart regular rate and rhythm, no murmur appreciated Abd soft, nontender, positive bowel  sounds, no liver or spleen tip palpated on exam, no fluid wave MSK no focal spinal tenderness, no joint edema Neuro: non-focal, well-oriented, appropriate affect Breasts: Deferred   Lab Results  Component Value Date   WBC 9.3 06/10/2019   HGB 13.8 06/10/2019   HCT 43.3 06/10/2019   MCV 91.2 06/10/2019   PLT 378 06/10/2019   Lab Results  Component Value Date   FERRITIN 175 06/10/2019   IRON 64 06/10/2019   TIBC 301 06/10/2019   UIBC 238 06/10/2019   IRONPCTSAT 21 06/10/2019   Lab Results  Component Value Date   RETICCTPCT 2.0 06/10/2019   RBC 4.75 06/10/2019   RBC 4.75 06/10/2019   RETICCTABS 88.8 12/21/2014   No results found for: KPAFRELGTCHN, LAMBDASER, KAPLAMBRATIO No results found for: IGGSERUM, IGA, IGMSERUM No results found for: Odetta Pink, SPEI   Chemistry      Component Value Date/Time   NA 136 05/28/2019 0749   NA 141 03/12/2017 0929   K 4.3 05/28/2019 0749   K 2.8 (LL) 03/12/2017 0929   CL 101 05/28/2019 0749   CL 106 12/08/2011 1145   CO2 27 01/07/2019 0941   CO2 27 03/12/2017 0929   BUN 12 05/28/2019 0749   BUN 9.4 03/12/2017 0929   CREATININE 1.50 (H) 05/28/2019 0749   CREATININE 1.28 (H) 01/07/2019 0941   CREATININE 1.2 (H) 03/12/2017 0929      Component Value Date/Time   CALCIUM 9.3 01/07/2019 0941   CALCIUM 9.5 03/12/2017 0929   ALKPHOS 92 01/07/2019 0941   ALKPHOS 125 03/12/2017 0929   AST 12 (L) 01/07/2019 0941   AST 13 03/12/2017 0929   ALT 9 01/07/2019 0941   ALT 9 03/12/2017 0929   BILITOT 0.2 (L) 01/07/2019 0941   BILITOT <0.22 03/12/2017 0929       Impression and Plan: Kari Hahn is a very pleasant 48 yo caucasian female withiron deficiency anemia due to GI blood loss with Crohn's.  She is doing fairly well at this time but has had some rectal blood loss and has an appointment with GI next week.  Her iron studies are stable at this time. No iron needed.  We will plan to see  her back in another month.  She will contact our office with any questions or concerns. We can certainly see her sooner if needed.   [name], NP 12/16/202010:03 AM

## 2019-06-11 NOTE — Telephone Encounter (Signed)
Called and spoke with patient about appointments that have been added per 12/16 los

## 2019-07-08 ENCOUNTER — Telehealth: Payer: Self-pay | Admitting: Hematology & Oncology

## 2019-07-08 NOTE — Telephone Encounter (Signed)
Patient called to r/s her appts from this week.  Appts moved out as requested

## 2019-07-10 ENCOUNTER — Ambulatory Visit: Payer: BC Managed Care – PPO | Admitting: Family

## 2019-07-10 ENCOUNTER — Other Ambulatory Visit: Payer: BC Managed Care – PPO

## 2019-07-21 ENCOUNTER — Other Ambulatory Visit: Payer: BC Managed Care – PPO

## 2019-07-21 ENCOUNTER — Ambulatory Visit: Payer: BC Managed Care – PPO | Admitting: Family

## 2019-07-22 ENCOUNTER — Encounter: Payer: Self-pay | Admitting: Family

## 2019-07-22 ENCOUNTER — Inpatient Hospital Stay: Payer: BC Managed Care – PPO | Attending: Family

## 2019-07-22 ENCOUNTER — Other Ambulatory Visit: Payer: Self-pay

## 2019-07-22 ENCOUNTER — Telehealth: Payer: Self-pay | Admitting: Family

## 2019-07-22 ENCOUNTER — Inpatient Hospital Stay (HOSPITAL_BASED_OUTPATIENT_CLINIC_OR_DEPARTMENT_OTHER): Payer: BC Managed Care – PPO | Admitting: Family

## 2019-07-22 VITALS — BP 127/72 | HR 91 | Temp 97.3°F | Resp 18 | Ht 62.0 in | Wt 181.1 lb

## 2019-07-22 DIAGNOSIS — I749 Embolism and thrombosis of unspecified artery: Secondary | ICD-10-CM | POA: Diagnosis not present

## 2019-07-22 DIAGNOSIS — K922 Gastrointestinal hemorrhage, unspecified: Secondary | ICD-10-CM | POA: Insufficient documentation

## 2019-07-22 DIAGNOSIS — Z7901 Long term (current) use of anticoagulants: Secondary | ICD-10-CM | POA: Diagnosis not present

## 2019-07-22 DIAGNOSIS — D500 Iron deficiency anemia secondary to blood loss (chronic): Secondary | ICD-10-CM

## 2019-07-22 DIAGNOSIS — G629 Polyneuropathy, unspecified: Secondary | ICD-10-CM | POA: Diagnosis not present

## 2019-07-22 DIAGNOSIS — I82401 Acute embolism and thrombosis of unspecified deep veins of right lower extremity: Secondary | ICD-10-CM | POA: Insufficient documentation

## 2019-07-22 DIAGNOSIS — D510 Vitamin B12 deficiency anemia due to intrinsic factor deficiency: Secondary | ICD-10-CM | POA: Diagnosis not present

## 2019-07-22 LAB — RETICULOCYTES
Immature Retic Fract: 22.7 % — ABNORMAL HIGH (ref 2.3–15.9)
RBC.: 4.61 MIL/uL (ref 3.87–5.11)
Retic Count, Absolute: 82.5 10*3/uL (ref 19.0–186.0)
Retic Ct Pct: 1.8 % (ref 0.4–3.1)

## 2019-07-22 LAB — CBC WITH DIFFERENTIAL (CANCER CENTER ONLY)
Abs Immature Granulocytes: 0.08 10*3/uL — ABNORMAL HIGH (ref 0.00–0.07)
Basophils Absolute: 0.1 10*3/uL (ref 0.0–0.1)
Basophils Relative: 1 %
Eosinophils Absolute: 0.3 10*3/uL (ref 0.0–0.5)
Eosinophils Relative: 2 %
HCT: 44.4 % (ref 36.0–46.0)
Hemoglobin: 14.8 g/dL (ref 12.0–15.0)
Immature Granulocytes: 1 %
Lymphocytes Relative: 17 %
Lymphs Abs: 1.8 10*3/uL (ref 0.7–4.0)
MCH: 32 pg (ref 26.0–34.0)
MCHC: 33.3 g/dL (ref 30.0–36.0)
MCV: 95.9 fL (ref 80.0–100.0)
Monocytes Absolute: 0.7 10*3/uL (ref 0.1–1.0)
Monocytes Relative: 6 %
Neutro Abs: 8 10*3/uL — ABNORMAL HIGH (ref 1.7–7.7)
Neutrophils Relative %: 73 %
Platelet Count: 385 10*3/uL (ref 150–400)
RBC: 4.63 MIL/uL (ref 3.87–5.11)
RDW: 21.2 % — ABNORMAL HIGH (ref 11.5–15.5)
WBC Count: 10.9 10*3/uL — ABNORMAL HIGH (ref 4.0–10.5)
nRBC: 0 % (ref 0.0–0.2)

## 2019-07-22 LAB — FERRITIN: Ferritin: 38 ng/mL (ref 11–307)

## 2019-07-22 LAB — IRON AND TIBC
Iron: 68 ug/dL (ref 41–142)
Saturation Ratios: 18 % — ABNORMAL LOW (ref 21–57)
TIBC: 386 ug/dL (ref 236–444)
UIBC: 318 ug/dL (ref 120–384)

## 2019-07-22 NOTE — Telephone Encounter (Signed)
Called and advised patient of appointments added per 1/26 los

## 2019-07-22 NOTE — Progress Notes (Signed)
Hematology and Oncology Follow Up Visit  Kari Hahn [id number] [birth year] 49 y.o. 07/22/2019   Principle Diagnosis:  Right lower extremity DVT Idiopathic aortic thrombosis Pernicious anemia Iron deficiency anemia Crohn's disease  Current Therapy:   Xarelto 20 mg PO daily Vitamin B12 PO BID daily IV iron as needed   Interim History:  Kari Hahn is here today for follow-up. She is doing well but is feeling fatigued at times.  She had a colonoscopy 2 weeks ago and states that she had minor ulcerations throughout her colon and that she was told her Crohn's has worsened. She swallowed the camera pill last week and still has not passed it. She has contacted GI and they have her scheduled for an xray later today.  She has not noted any more bleeding. No bruising or petechiae.  No fever, chills, n/v, cough, rash, dizziness, SOB, chest pain, palpitations or changes in bowel or bladder habits.  She has some lower right abdominal pain she feels may be due to her hernia.  She states that she is currently going through the change of life since November 2020 and has hot flashes and night sweat off and on.  No swelling or tenderness in her extremities.  The neuropathy in her hands and feet waxes and wanes.  No falls or syncopal episodes to report.  She has maintained a good appetite and is staying well hydrated. Her weight is stable.   ECOG Performance Status: 1 - Symptomatic but completely ambulatory  Medications:  Allergies as of 07/22/2019      Reactions   Ibuprofen Other (See Comments)   stomache upset      Medication List       Accurate as of July 22, 2019  8:54 AM. If you have any questions, ask your nurse or doctor.        desmopressin 0.2 MG tablet Commonly known as: DDAVP Take 0.2 mg by mouth 2 (two) times daily. 09/02/18-BID per pt   Exforge 5-160 MG tablet Generic drug: amLODipine-valsartan 1 TABLET BY MOUTH DAILY FOR BLOOD PRESSURE--- takes in am after eaten    fluticasone 50 MCG/ACT nasal spray Commonly known as: FLONASE daily as needed.   folic acid 1 MG tablet Commonly known as: FOLVITE Take 1 mg by mouth daily.   gabapentin 800 MG tablet Commonly known as: NEURONTIN Take 800 mg by mouth 3 (three) times daily.   Humira Pen 40 MG/0.4ML Pnkt Generic drug: Adalimumab Every 2 weeks   Lexapro 20 MG tablet Generic drug: escitalopram Take 40 mg by mouth at bedtime. 2  20 mg tab = 40 mg daily   ondansetron 8 MG tablet Commonly known as: ZOFRAN TAKE 1 TABLET BY MOUTH 2 TIMES A DAY   oxyCODONE-acetaminophen 10-325 MG tablet Commonly known as: PERCOCET Take 1 tablet by mouth 4 (four) times daily.   pantoprazole 40 MG tablet Commonly known as: PROTONIX Take 40 mg by mouth daily.   potassium chloride SA 20 MEQ tablet Commonly known as: KLOR-CON Take 20 mEq by mouth every evening.   VITAMIN B 12 PO Take by mouth daily.   vitamin C 1000 MG tablet Take 1,000 mg by mouth daily.   Vitamin D3 125 MCG (5000 UT) Tabs Take by mouth every morning.   Xarelto 10 MG Tabs tablet Generic drug: rivaroxaban TAKE 1 TABLET BY MOUTH EVERY DAY WITH SUPPER   zinc gluconate 50 MG tablet Take 50 mg by mouth daily.       Allergies:  Allergies  Allergen Reactions  . Ibuprofen Other (See Comments)    stomache upset    Past Medical History, Surgical history, Social history, and Family History were reviewed and updated.  Review of Systems: All other 10 point review of systems is negative.   Physical Exam:  vitals were not taken for this visit.   Wt Readings from Last 3 Encounters:  05/28/19 174 lb 2 oz (79 kg)  04/30/19 172 lb 8 oz (78.2 kg)  01/07/19 176 lb (79.8 kg)    Ocular: Sclerae unicteric, pupils equal, round and reactive to light Ear-nose-throat: Oropharynx clear, dentition fair Lymphatic: No cervical or supraclavicular adenopathy Lungs no rales or rhonchi, good excursion bilaterally Heart regular rate and rhythm, no  murmur appreciated Abd soft, nontender, positive bowel sounds, no liver or spleen tip palpated on exam, no fluid wave  MSK no focal spinal tenderness, no joint edema Neuro: non-focal, well-oriented, appropriate affect Breasts: Deferred   Lab Results  Component Value Date   WBC 9.3 06/10/2019   HGB 13.8 06/10/2019   HCT 43.3 06/10/2019   MCV 91.2 06/10/2019   PLT 378 06/10/2019   Lab Results  Component Value Date   FERRITIN 175 06/10/2019   IRON 64 06/10/2019   TIBC 301 06/10/2019   UIBC 238 06/10/2019   IRONPCTSAT 21 06/10/2019   Lab Results  Component Value Date   RETICCTPCT 2.0 06/10/2019   RBC 4.75 06/10/2019   RBC 4.75 06/10/2019   RETICCTABS 88.8 12/21/2014   No results found for: KPAFRELGTCHN, LAMBDASER, KAPLAMBRATIO No results found for: IGGSERUM, IGA, IGMSERUM No results found for: Odetta Pink, SPEI   Chemistry      Component Value Date/Time   NA 136 05/28/2019 0749   NA 141 03/12/2017 0929   K 4.3 05/28/2019 0749   K 2.8 (LL) 03/12/2017 0929   CL 101 05/28/2019 0749   CL 106 12/08/2011 1145   CO2 27 01/07/2019 0941   CO2 27 03/12/2017 0929   BUN 12 05/28/2019 0749   BUN 9.4 03/12/2017 0929   CREATININE 1.50 (H) 05/28/2019 0749   CREATININE 1.28 (H) 01/07/2019 0941   CREATININE 1.2 (H) 03/12/2017 0929      Component Value Date/Time   CALCIUM 9.3 01/07/2019 0941   CALCIUM 9.5 03/12/2017 0929   ALKPHOS 92 01/07/2019 0941   ALKPHOS 125 03/12/2017 0929   AST 12 (L) 01/07/2019 0941   AST 13 03/12/2017 0929   ALT 9 01/07/2019 0941   ALT 9 03/12/2017 0929   BILITOT 0.2 (L) 01/07/2019 0941   BILITOT <0.22 03/12/2017 0929       Impression and Plan: Kari Hahn is a very pleasant 49yo caucasian female withiron deficiency anemia due to GI blood loss with Crohn's.  We will see what her iron studies show and bring her back in for infusion if needed.  We will plan to see her back in another 2 months.   She will contact our office with any questions or concerns. We can certainly see her sooner if needed.   [name], NP 1/26/20218:54 AM

## 2019-07-23 ENCOUNTER — Other Ambulatory Visit: Payer: Self-pay | Admitting: Family

## 2019-07-23 ENCOUNTER — Telehealth: Payer: Self-pay | Admitting: Family

## 2019-07-23 NOTE — Telephone Encounter (Signed)
I called and advised patient of appointment that has been scheduled for iron infusion.  She was ok with both date/time/ 1/26 sch msg

## 2019-07-28 ENCOUNTER — Ambulatory Visit: Payer: BC Managed Care – PPO

## 2019-07-30 ENCOUNTER — Other Ambulatory Visit: Payer: Self-pay

## 2019-07-30 ENCOUNTER — Inpatient Hospital Stay: Payer: BC Managed Care – PPO | Attending: Family

## 2019-07-30 VITALS — BP 133/73 | HR 86 | Temp 97.8°F | Resp 18

## 2019-07-30 DIAGNOSIS — D509 Iron deficiency anemia, unspecified: Secondary | ICD-10-CM

## 2019-07-30 DIAGNOSIS — K50911 Crohn's disease, unspecified, with rectal bleeding: Secondary | ICD-10-CM | POA: Insufficient documentation

## 2019-07-30 DIAGNOSIS — D500 Iron deficiency anemia secondary to blood loss (chronic): Secondary | ICD-10-CM | POA: Insufficient documentation

## 2019-07-30 DIAGNOSIS — D510 Vitamin B12 deficiency anemia due to intrinsic factor deficiency: Secondary | ICD-10-CM

## 2019-07-30 MED ORDER — SODIUM CHLORIDE 0.9 % IV SOLN
510.0000 mg | Freq: Once | INTRAVENOUS | Status: AC
  Administered 2019-07-30: 09:00:00 510 mg via INTRAVENOUS
  Filled 2019-07-30: qty 17

## 2019-07-30 NOTE — Patient Instructions (Signed)

## 2019-07-30 NOTE — Progress Notes (Signed)
Pt refused to stay for 30 minutes post Feraheme.  Pt without complaints at this time.

## 2019-09-17 ENCOUNTER — Other Ambulatory Visit: Payer: Self-pay | Admitting: Family

## 2019-09-17 DIAGNOSIS — I749 Embolism and thrombosis of unspecified artery: Secondary | ICD-10-CM

## 2019-09-19 ENCOUNTER — Other Ambulatory Visit: Payer: Self-pay

## 2019-09-19 ENCOUNTER — Encounter: Payer: Self-pay | Admitting: Family

## 2019-09-19 ENCOUNTER — Inpatient Hospital Stay: Payer: BC Managed Care – PPO | Attending: Family

## 2019-09-19 ENCOUNTER — Inpatient Hospital Stay (HOSPITAL_BASED_OUTPATIENT_CLINIC_OR_DEPARTMENT_OTHER): Payer: BC Managed Care – PPO | Admitting: Family

## 2019-09-19 ENCOUNTER — Telehealth: Payer: Self-pay | Admitting: Family

## 2019-09-19 VITALS — BP 150/87 | HR 92 | Temp 97.5°F | Resp 19 | Ht 62.0 in | Wt 192.8 lb

## 2019-09-19 DIAGNOSIS — D500 Iron deficiency anemia secondary to blood loss (chronic): Secondary | ICD-10-CM

## 2019-09-19 DIAGNOSIS — I82431 Acute embolism and thrombosis of right popliteal vein: Secondary | ICD-10-CM | POA: Diagnosis not present

## 2019-09-19 DIAGNOSIS — Z7901 Long term (current) use of anticoagulants: Secondary | ICD-10-CM | POA: Diagnosis not present

## 2019-09-19 DIAGNOSIS — Z79899 Other long term (current) drug therapy: Secondary | ICD-10-CM | POA: Insufficient documentation

## 2019-09-19 DIAGNOSIS — I82409 Acute embolism and thrombosis of unspecified deep veins of unspecified lower extremity: Secondary | ICD-10-CM | POA: Insufficient documentation

## 2019-09-19 DIAGNOSIS — I749 Embolism and thrombosis of unspecified artery: Secondary | ICD-10-CM

## 2019-09-19 DIAGNOSIS — D510 Vitamin B12 deficiency anemia due to intrinsic factor deficiency: Secondary | ICD-10-CM | POA: Diagnosis not present

## 2019-09-19 DIAGNOSIS — R197 Diarrhea, unspecified: Secondary | ICD-10-CM | POA: Diagnosis not present

## 2019-09-19 LAB — RETICULOCYTES
Immature Retic Fract: 35.9 % — ABNORMAL HIGH (ref 2.3–15.9)
RBC.: 4.29 MIL/uL (ref 3.87–5.11)
Retic Count, Absolute: 115 10*3/uL (ref 19.0–186.0)
Retic Ct Pct: 2.7 % (ref 0.4–3.1)

## 2019-09-19 LAB — CBC WITH DIFFERENTIAL (CANCER CENTER ONLY)
Abs Immature Granulocytes: 0.2 10*3/uL — ABNORMAL HIGH (ref 0.00–0.07)
Basophils Absolute: 0.1 10*3/uL (ref 0.0–0.1)
Basophils Relative: 1 %
Eosinophils Absolute: 0.1 10*3/uL (ref 0.0–0.5)
Eosinophils Relative: 1 %
HCT: 44.9 % (ref 36.0–46.0)
Hemoglobin: 14.7 g/dL (ref 12.0–15.0)
Immature Granulocytes: 2 %
Lymphocytes Relative: 19 %
Lymphs Abs: 2.3 10*3/uL (ref 0.7–4.0)
MCH: 34.1 pg — ABNORMAL HIGH (ref 26.0–34.0)
MCHC: 32.7 g/dL (ref 30.0–36.0)
MCV: 104.2 fL — ABNORMAL HIGH (ref 80.0–100.0)
Monocytes Absolute: 0.9 10*3/uL (ref 0.1–1.0)
Monocytes Relative: 7 %
Neutro Abs: 8.7 10*3/uL — ABNORMAL HIGH (ref 1.7–7.7)
Neutrophils Relative %: 70 %
Platelet Count: 453 10*3/uL — ABNORMAL HIGH (ref 150–400)
RBC: 4.31 MIL/uL (ref 3.87–5.11)
RDW: 14.5 % (ref 11.5–15.5)
WBC Count: 12.2 10*3/uL — ABNORMAL HIGH (ref 4.0–10.5)
nRBC: 0 % (ref 0.0–0.2)

## 2019-09-19 LAB — IRON AND TIBC
Iron: 45 ug/dL (ref 41–142)
Saturation Ratios: 13 % — ABNORMAL LOW (ref 21–57)
TIBC: 354 ug/dL (ref 236–444)
UIBC: 309 ug/dL (ref 120–384)

## 2019-09-19 LAB — FERRITIN: Ferritin: 88 ng/mL (ref 11–307)

## 2019-09-19 NOTE — Telephone Encounter (Signed)
Called and spoke with patient Regarding appointments added per 3/26 los

## 2019-09-19 NOTE — Progress Notes (Signed)
Hematology and Oncology Follow Up Visit  JOMARIE GELLIS [id number] 1970-11-18 49 y.o. 09/19/2019   Principle Diagnosis:  Right lower extremity DVT Idiopathic aortic thrombosis Pernicious anemia Iron deficiency anemia Crohn's disease  Current Therapy:        Xarelto 20 mg PO daily Vitamin B12 PO BID daily IV iron as needed   Interim History:  Kari Hahn is here today for follow-up. She is getting over a recent Crohn's flare. She has been having blood in her stool and finished her prednisone yesterday. She states that her insurance gave her issues with her Humira prescription and is now making her go through a mail delivery pharmacy. Her medication was a month late and she feels that this led to her flare.  She is having a good bit of diarrhea and is wearing disposable underwear today.  She states that she is going to have some sort of stimulation testing with her urologist (they are working closely with her GI team) that will help her not to have urinary and stool incontinence.  No other bleeding noted. No bruising or petechiae.  She is no longer having a cycle and appears to be going through the change of life with hot flashes and sweats.  She is symptomatic with fatigue at this time.  No fever, chills, n/v, cough, rash, dizziness, SOB, chest pain, palpitations, abdominal pain or changes in bowel or bladder habits.  No swelling in her extremities at this time.  The neuropathy in her feet waxes and wanes.  No falls or syncopal episodes to report.  She is eating well and hydrating properly.   ECOG Performance Status: 1 - Symptomatic but completely ambulatory  Medications:  Allergies as of 09/19/2019      Reactions   Ibuprofen Nausea Only      Medication List       Accurate as of September 19, 2019  9:01 AM. If you have any questions, ask your nurse or doctor.        desmopressin 0.2 MG tablet Commonly known as: DDAVP Take 0.2 mg by mouth 2 (two) times daily. 09/02/18-BID per  pt   Exforge 5-160 MG tablet Generic drug: amLODipine-valsartan 1 TABLET BY MOUTH DAILY FOR BLOOD PRESSURE--- takes in am after eaten   fluticasone 50 MCG/ACT nasal spray Commonly known as: FLONASE daily as needed.   folic acid 1 MG tablet Commonly known as: FOLVITE Take 1 mg by mouth daily.   gabapentin 800 MG tablet Commonly known as: NEURONTIN Take 800 mg by mouth 3 (three) times daily.   Humira Pen 40 MG/0.4ML Pnkt Generic drug: Adalimumab Every 2 weeks   Lexapro 20 MG tablet Generic drug: escitalopram Take 40 mg by mouth at bedtime. 2  20 mg tab = 40 mg daily   nitrofurantoin (macrocrystal-monohydrate) 100 MG capsule Commonly known as: MACROBID Take 100 mg by mouth 2 (two) times daily.   ondansetron 8 MG tablet Commonly known as: ZOFRAN TAKE 1 TABLET BY MOUTH 2 TIMES A DAY   oxyCODONE-acetaminophen 10-325 MG tablet Commonly known as: PERCOCET Take 1 tablet by mouth 4 (four) times daily.   pantoprazole 40 MG tablet Commonly known as: PROTONIX Take 40 mg by mouth daily.   potassium chloride SA 20 MEQ tablet Commonly known as: KLOR-CON Take 20 mEq by mouth every evening.   VITAMIN B 12 PO Take by mouth daily.   vitamin C 1000 MG tablet Take 1,000 mg by mouth daily.   Vitamin D3 125 MCG (5000 UT) Tabs  Take by mouth every morning.   Xarelto 10 MG Tabs tablet Generic drug: rivaroxaban TAKE 1 TABLET BY MOUTH EVERY DAY WITH SUPPER   zinc gluconate 50 MG tablet Take 50 mg by mouth daily.       Allergies:  Allergies  Allergen Reactions  . Ibuprofen Nausea Only    Past Medical History, Surgical history, Social history, and Family History were reviewed and updated.  Review of Systems: All other 10 point review of systems is negative.   Physical Exam:  vitals were not taken for this visit.   Wt Readings from Last 3 Encounters:  07/22/19 181 lb 1.3 oz (82.1 kg)  05/28/19 174 lb 2 oz (79 kg)  04/30/19 172 lb 8 oz (78.2 kg)    Ocular: Sclerae  unicteric, pupils equal, round and reactive to light Ear-nose-throat: Oropharynx clear, dentition fair Lymphatic: No cervical or supraclavicular adenopathy Lungs no rales or rhonchi, good excursion bilaterally Heart regular rate and rhythm, no murmur appreciated Abd soft, nontender, positive bowel sounds, no liver or spleen tip palpated on exam, no fluid wave  MSK no focal spinal tenderness, no joint edema Neuro: non-focal, well-oriented, appropriate affect Breasts: Deferred   Lab Results  Component Value Date   WBC 10.9 (H) 07/22/2019   HGB 14.8 07/22/2019   HCT 44.4 07/22/2019   MCV 95.9 07/22/2019   PLT 385 07/22/2019   Lab Results  Component Value Date   FERRITIN 38 07/22/2019   IRON 68 07/22/2019   TIBC 386 07/22/2019   UIBC 318 07/22/2019   IRONPCTSAT 18 (L) 07/22/2019   Lab Results  Component Value Date   RETICCTPCT 1.8 07/22/2019   RBC 4.63 07/22/2019   RBC 4.61 07/22/2019   RETICCTABS 88.8 12/21/2014   No results found for: KPAFRELGTCHN, LAMBDASER, KAPLAMBRATIO No results found for: IGGSERUM, IGA, IGMSERUM No results found for: Odetta Pink, SPEI   Chemistry      Component Value Date/Time   NA 136 05/28/2019 0749   NA 141 03/12/2017 0929   K 4.3 05/28/2019 0749   K 2.8 (LL) 03/12/2017 0929   CL 101 05/28/2019 0749   CL 106 12/08/2011 1145   CO2 27 01/07/2019 0941   CO2 27 03/12/2017 0929   BUN 12 05/28/2019 0749   BUN 9.4 03/12/2017 0929   CREATININE 1.50 (H) 05/28/2019 0749   CREATININE 1.28 (H) 01/07/2019 0941   CREATININE 1.2 (H) 03/12/2017 0929      Component Value Date/Time   CALCIUM 9.3 01/07/2019 0941   CALCIUM 9.5 03/12/2017 0929   ALKPHOS 92 01/07/2019 0941   ALKPHOS 125 03/12/2017 0929   AST 12 (L) 01/07/2019 0941   AST 13 03/12/2017 0929   ALT 9 01/07/2019 0941   ALT 9 03/12/2017 0929   BILITOT 0.2 (L) 01/07/2019 0941   BILITOT <0.22 03/12/2017 0929       Impression and Plan:  Ms. Rosetti is a very pleasant 49yo caucasian female withiron deficiency anemia due to GI blood loss with Crohn's.  We will see what her iron studies look like and bring her back in for infusion if needed.  We will plan to see her back in another 2 months.  She will contact our office with any questions or concerns. We can certainly see her sooner if needed.   [name], NP 3/26/20219:01 AM

## 2019-09-22 ENCOUNTER — Telehealth: Payer: Self-pay | Admitting: Family

## 2019-09-22 NOTE — Telephone Encounter (Signed)
I called and spoke with patient regarding iron infusion appointments that have been added. She as in agreemtn with 3/30 date but may need to change next weeks appt due to conflict.  She will adjust appt when she is in the office tomorrow.per 3/26 sch msg

## 2019-09-23 ENCOUNTER — Ambulatory Visit: Payer: BC Managed Care – PPO

## 2019-09-29 ENCOUNTER — Ambulatory Visit: Payer: BC Managed Care – PPO

## 2019-10-07 ENCOUNTER — Other Ambulatory Visit: Payer: Self-pay

## 2019-10-07 ENCOUNTER — Inpatient Hospital Stay: Payer: BC Managed Care – PPO | Attending: Family

## 2019-10-07 VITALS — BP 142/91 | HR 90 | Temp 98.0°F | Resp 20

## 2019-10-07 DIAGNOSIS — D509 Iron deficiency anemia, unspecified: Secondary | ICD-10-CM

## 2019-10-07 DIAGNOSIS — D500 Iron deficiency anemia secondary to blood loss (chronic): Secondary | ICD-10-CM | POA: Insufficient documentation

## 2019-10-07 DIAGNOSIS — D510 Vitamin B12 deficiency anemia due to intrinsic factor deficiency: Secondary | ICD-10-CM

## 2019-10-07 MED ORDER — SODIUM CHLORIDE 0.9 % IV SOLN
510.0000 mg | Freq: Once | INTRAVENOUS | Status: AC
  Administered 2019-10-07: 510 mg via INTRAVENOUS
  Filled 2019-10-07: qty 510

## 2019-10-07 NOTE — Patient Instructions (Signed)

## 2019-10-14 ENCOUNTER — Inpatient Hospital Stay: Payer: BC Managed Care – PPO

## 2019-10-15 ENCOUNTER — Other Ambulatory Visit: Payer: Self-pay

## 2019-10-15 ENCOUNTER — Inpatient Hospital Stay: Payer: BC Managed Care – PPO

## 2019-10-15 VITALS — BP 150/92 | HR 80 | Temp 97.8°F | Resp 21

## 2019-10-15 DIAGNOSIS — D500 Iron deficiency anemia secondary to blood loss (chronic): Secondary | ICD-10-CM | POA: Diagnosis not present

## 2019-10-15 DIAGNOSIS — D509 Iron deficiency anemia, unspecified: Secondary | ICD-10-CM

## 2019-10-15 DIAGNOSIS — D510 Vitamin B12 deficiency anemia due to intrinsic factor deficiency: Secondary | ICD-10-CM

## 2019-10-15 MED ORDER — SODIUM CHLORIDE 0.9 % IV SOLN
510.0000 mg | Freq: Once | INTRAVENOUS | Status: AC
  Administered 2019-10-15: 510 mg via INTRAVENOUS
  Filled 2019-10-15: qty 510

## 2019-10-15 NOTE — Patient Instructions (Signed)

## 2019-10-15 NOTE — Progress Notes (Signed)
Pt declined to stay for 30 min observation, reports feeling well and VS taken.

## 2019-11-03 ENCOUNTER — Other Ambulatory Visit: Payer: Self-pay | Admitting: Urology

## 2019-11-18 ENCOUNTER — Other Ambulatory Visit: Payer: Self-pay | Admitting: *Deleted

## 2019-11-18 MED ORDER — PROCHLORPERAZINE MALEATE 10 MG PO TABS: 10.0000 mg | ORAL_TABLET | Freq: Four times a day (QID) | ORAL | 0 refills | Status: DC | PRN

## 2019-11-19 ENCOUNTER — Inpatient Hospital Stay: Payer: BC Managed Care – PPO | Admitting: Family

## 2019-11-19 ENCOUNTER — Inpatient Hospital Stay: Payer: BC Managed Care – PPO

## 2019-11-25 ENCOUNTER — Other Ambulatory Visit: Payer: Self-pay | Admitting: Family

## 2019-11-25 DIAGNOSIS — K50919 Crohn's disease, unspecified, with unspecified complications: Secondary | ICD-10-CM

## 2019-12-12 ENCOUNTER — Telehealth: Payer: Self-pay | Admitting: Family

## 2019-12-12 ENCOUNTER — Inpatient Hospital Stay: Payer: BC Managed Care – PPO | Attending: Family

## 2019-12-12 ENCOUNTER — Other Ambulatory Visit: Payer: Self-pay

## 2019-12-12 ENCOUNTER — Encounter: Payer: Self-pay | Admitting: Family

## 2019-12-12 ENCOUNTER — Inpatient Hospital Stay (HOSPITAL_BASED_OUTPATIENT_CLINIC_OR_DEPARTMENT_OTHER): Payer: BC Managed Care – PPO | Admitting: Family

## 2019-12-12 VITALS — BP 133/65 | HR 83 | Temp 96.8°F | Resp 19 | Wt 199.0 lb

## 2019-12-12 DIAGNOSIS — I82401 Acute embolism and thrombosis of unspecified deep veins of right lower extremity: Secondary | ICD-10-CM | POA: Diagnosis not present

## 2019-12-12 DIAGNOSIS — I82431 Acute embolism and thrombosis of right popliteal vein: Secondary | ICD-10-CM

## 2019-12-12 DIAGNOSIS — I7410 Embolism and thrombosis of unspecified parts of aorta: Secondary | ICD-10-CM | POA: Insufficient documentation

## 2019-12-12 DIAGNOSIS — K5090 Crohn's disease, unspecified, without complications: Secondary | ICD-10-CM | POA: Diagnosis not present

## 2019-12-12 DIAGNOSIS — Z886 Allergy status to analgesic agent status: Secondary | ICD-10-CM | POA: Insufficient documentation

## 2019-12-12 DIAGNOSIS — K922 Gastrointestinal hemorrhage, unspecified: Secondary | ICD-10-CM | POA: Insufficient documentation

## 2019-12-12 DIAGNOSIS — D500 Iron deficiency anemia secondary to blood loss (chronic): Secondary | ICD-10-CM | POA: Diagnosis not present

## 2019-12-12 DIAGNOSIS — G629 Polyneuropathy, unspecified: Secondary | ICD-10-CM | POA: Insufficient documentation

## 2019-12-12 DIAGNOSIS — F17200 Nicotine dependence, unspecified, uncomplicated: Secondary | ICD-10-CM | POA: Insufficient documentation

## 2019-12-12 DIAGNOSIS — Z7901 Long term (current) use of anticoagulants: Secondary | ICD-10-CM | POA: Insufficient documentation

## 2019-12-12 LAB — CBC WITH DIFFERENTIAL (CANCER CENTER ONLY)
Abs Immature Granulocytes: 0.11 10*3/uL — ABNORMAL HIGH (ref 0.00–0.07)
Basophils Absolute: 0.1 10*3/uL (ref 0.0–0.1)
Basophils Relative: 1 %
Eosinophils Absolute: 0.3 10*3/uL (ref 0.0–0.5)
Eosinophils Relative: 2 %
HCT: 52.3 % — ABNORMAL HIGH (ref 36.0–46.0)
Hemoglobin: 17.6 g/dL — ABNORMAL HIGH (ref 12.0–15.0)
Immature Granulocytes: 1 %
Lymphocytes Relative: 17 %
Lymphs Abs: 2.3 10*3/uL (ref 0.7–4.0)
MCH: 36.3 pg — ABNORMAL HIGH (ref 26.0–34.0)
MCHC: 33.7 g/dL (ref 30.0–36.0)
MCV: 107.8 fL — ABNORMAL HIGH (ref 80.0–100.0)
Monocytes Absolute: 1.1 10*3/uL — ABNORMAL HIGH (ref 0.1–1.0)
Monocytes Relative: 8 %
Neutro Abs: 9.5 10*3/uL — ABNORMAL HIGH (ref 1.7–7.7)
Neutrophils Relative %: 71 %
Platelet Count: 326 10*3/uL (ref 150–400)
RBC: 4.85 MIL/uL (ref 3.87–5.11)
RDW: 15.8 % — ABNORMAL HIGH (ref 11.5–15.5)
WBC Count: 13.3 10*3/uL — ABNORMAL HIGH (ref 4.0–10.5)
nRBC: 0 % (ref 0.0–0.2)

## 2019-12-12 LAB — RETICULOCYTES
Immature Retic Fract: 15 % (ref 2.3–15.9)
RBC.: 4.93 MIL/uL (ref 3.87–5.11)
Retic Count, Absolute: 160.2 10*3/uL (ref 19.0–186.0)
Retic Ct Pct: 3.3 % — ABNORMAL HIGH (ref 0.4–3.1)

## 2019-12-12 LAB — FERRITIN: Ferritin: 168 ng/mL (ref 11–307)

## 2019-12-12 LAB — IRON AND TIBC
Iron: 83 ug/dL (ref 41–142)
Saturation Ratios: 25 % (ref 21–57)
TIBC: 336 ug/dL (ref 236–444)
UIBC: 253 ug/dL (ref 120–384)

## 2019-12-12 NOTE — Telephone Encounter (Signed)
Called patient and LM for her regarding appointments that have been scheduled per 6/18 los

## 2019-12-12 NOTE — Progress Notes (Signed)
Hematology and Oncology Follow Up Visit  Kari Hahn [id number] 1970-07-31 49 y.o. 12/12/2019   Principle Diagnosis:  Right lower extremity DVT Idiopathic aortic thrombosis Pernicious anemia Iron deficiency anemia Crohn's disease  Current Therapy: Xarelto 20 mg PO daily Vitamin B12 PO BID daily IV iron as needed   Interim History:  Kari Hahn is here today for follow-up. She is doing fairly well but continues to deal with Crohn's flares. She is currently on predisone which she waits insurance to approve Stelara.  She has fullness in the face due to steroids. She is hoping to taper off soon. She states that they make her feel anxious and depressed. She denies any feelings of harming her self or others.  She states that die to stress she has been smoking 2 + ppd. Her Hgb is elevated at 17.6 and WBC count 13.3. She is trying to wean off but states it has been hard.  She denies noting any recent blood loss and no longer has a cycle. No bruising or petechiae.  She is scheduled to have her interstem implant placed with urology Dr. Ellison Hughs on 12/24/2019.   No fever, chills, n/v, cough, rash, dizziness, SOB, chest pain or changes in bladder habits.  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  No falls or syncopal episodes to report.  She has maintained a good appetite and is staying well hydrated. Her weight is stable.   ECOG Performance Status: 1 - Symptomatic but completely ambulatory  Medications:  Allergies as of 12/12/2019      Reactions   Ibuprofen Nausea Only      Medication List       Accurate as of December 12, 2019  8:53 AM. If you have any questions, ask your nurse or doctor.        desmopressin 0.2 MG tablet Commonly known as: DDAVP Take 0.2 mg by mouth 2 (two) times daily. 09/02/18-BID per pt   Exforge 5-160 MG tablet Generic drug: amLODipine-valsartan 1 TABLET BY MOUTH DAILY FOR BLOOD PRESSURE--- takes in am after eaten   fluticasone 50  MCG/ACT nasal spray Commonly known as: FLONASE daily as needed.   folic acid 1 MG tablet Commonly known as: FOLVITE Take 1 mg by mouth daily.   gabapentin 800 MG tablet Commonly known as: NEURONTIN Take 800 mg by mouth 3 (three) times daily.   Humira Pen 40 MG/0.4ML Pnkt Generic drug: Adalimumab Every 2 weeks   Lexapro 20 MG tablet Generic drug: escitalopram Take 40 mg by mouth at bedtime. 2  20 mg tab = 40 mg daily   nitrofurantoin (macrocrystal-monohydrate) 100 MG capsule Commonly known as: MACROBID Take 100 mg by mouth 2 (two) times daily.   ondansetron 8 MG tablet Commonly known as: ZOFRAN TAKE 1 TABLET BY MOUTH 2 TIMES A DAY   oxyCODONE-acetaminophen 10-325 MG tablet Commonly known as: PERCOCET Take 1 tablet by mouth 4 (four) times daily.   pantoprazole 40 MG tablet Commonly known as: PROTONIX Take 40 mg by mouth daily.   potassium chloride SA 20 MEQ tablet Commonly known as: KLOR-CON Take 20 mEq by mouth every evening.   predniSONE 10 MG tablet Commonly known as: DELTASONE 10 mg daily.   prochlorperazine 10 MG tablet Commonly known as: COMPAZINE Take 1 tablet (10 mg total) by mouth every 6 (six) hours as needed for nausea or vomiting.   VITAMIN B 12 PO Take by mouth daily.   vitamin C 1000 MG tablet Take 1,000 mg by  mouth daily.   Vitamin D3 125 MCG (5000 UT) Tabs Take by mouth every morning.   Xarelto 10 MG Tabs tablet Generic drug: rivaroxaban TAKE 1 TABLET BY MOUTH EVERY DAY WITH SUPPER   zinc gluconate 50 MG tablet Take 50 mg by mouth daily.       Allergies:  Allergies  Allergen Reactions  . Ibuprofen Nausea Only    Past Medical History, Surgical history, Social history, and Family History were reviewed and updated.  Review of Systems: All other 10 point review of systems is negative.   Physical Exam:  weight is 199 lb (90.3 kg). Her temporal temperature is 96.8 F (36 C) (abnormal). Her blood pressure is 133/65 and her pulse  is 83. Her respiration is 19 and oxygen saturation is 95%.   Wt Readings from Last 3 Encounters:  12/12/19 199 lb (90.3 kg)  09/19/19 192 lb 12.8 oz (87.5 kg)  07/22/19 181 lb 1.3 oz (82.1 kg)    Ocular: Sclerae unicteric, pupils equal, round and reactive to light Ear-nose-throat: Oropharynx clear, dentition fair Lymphatic: No cervical or supraclavicular adenopathy Lungs no rales or rhonchi, good excursion bilaterally Heart regular rate and rhythm, no murmur appreciated Abd soft, nontender, positive bowel sounds, no liver or spleen tip palpated on exam, no fluid wave  MSK no focal spinal tenderness, no joint edema Neuro: non-focal, well-oriented, appropriate affect Breasts: Deferred   Lab Results  Component Value Date   WBC 13.3 (H) 12/12/2019   HGB 17.6 (H) 12/12/2019   HCT 52.3 (H) 12/12/2019   MCV 107.8 (H) 12/12/2019   PLT 326 12/12/2019   Lab Results  Component Value Date   FERRITIN 88 09/19/2019   IRON 45 09/19/2019   TIBC 354 09/19/2019   UIBC 309 09/19/2019   IRONPCTSAT 13 (L) 09/19/2019   Lab Results  Component Value Date   RETICCTPCT 3.3 (H) 12/12/2019   RBC 4.93 12/12/2019   RETICCTABS 88.8 12/21/2014   No results found for: KPAFRELGTCHN, LAMBDASER, KAPLAMBRATIO No results found for: IGGSERUM, IGA, IGMSERUM No results found for: Odetta Pink, SPEI   Chemistry      Component Value Date/Time   NA 136 05/28/2019 0749   NA 141 03/12/2017 0929   K 4.3 05/28/2019 0749   K 2.8 (LL) 03/12/2017 0929   CL 101 05/28/2019 0749   CL 106 12/08/2011 1145   CO2 27 01/07/2019 0941   CO2 27 03/12/2017 0929   BUN 12 05/28/2019 0749   BUN 9.4 03/12/2017 0929   CREATININE 1.50 (H) 05/28/2019 0749   CREATININE 1.28 (H) 01/07/2019 0941   CREATININE 1.2 (H) 03/12/2017 0929      Component Value Date/Time   CALCIUM 9.3 01/07/2019 0941   CALCIUM 9.5 03/12/2017 0929   ALKPHOS 92 01/07/2019 0941   ALKPHOS 125  03/12/2017 0929   AST 12 (L) 01/07/2019 0941   AST 13 03/12/2017 0929   ALT 9 01/07/2019 0941   ALT 9 03/12/2017 0929   BILITOT 0.2 (L) 01/07/2019 0941   BILITOT <0.22 03/12/2017 0929       Impression and Plan: Kari Hahn is a very pleasant 49yo caucasian female withiron deficiency anemia due to GI blood loss with Crohn's. We will see what her iron studies look like and bring her back in for infusion if needed.  We will plan to see her back in another 3 months.  She will contact our office with any questions or concerns. We can certainly see her  sooner if needed.   [name], NP 6/18/20218:53 AM

## 2019-12-17 ENCOUNTER — Encounter (HOSPITAL_BASED_OUTPATIENT_CLINIC_OR_DEPARTMENT_OTHER): Payer: Self-pay | Admitting: Urology

## 2019-12-17 ENCOUNTER — Other Ambulatory Visit: Payer: Self-pay

## 2019-12-17 ENCOUNTER — Other Ambulatory Visit: Payer: Self-pay | Admitting: Urology

## 2019-12-17 NOTE — Progress Notes (Addendum)
Addendum:  Chart reviewed by anesthesia, Konrad Felix PA,  Meets surgery center guidelines and has clearance to stop xarelto from dr Marin Olp with chart.   Spoke w/ via phone for pre-op interview--- PT Lab needs dos----  [place] and EKG             Lab results------ pt had CBCdiff done 12-12-2019 results in epic COVID test ---- 12-20-2019 @ 4801 Arrive at ------- 0645 NPO after ------ MN Medications to take morning of surgery ----- Oxycodone, Gabapentin w/ sips of water Diabetic medication ----- n/a Patient Special Instructions ----- n/a Pre-Op special Istructions ----- pre-op orders pending. Called and spoke w/ [place] , [place] scheduler for Dr Lovena Neighbours, requested orders. Pt has clearance to stop xarelto with chart Patient verbalized understanding of instructions that were given at this phone interview. Patient denies shortness of breath, chest pain, fever, cough a this phone interview.  Anesthesia Review:  To be reviewed by Konrad Felix PA PCP:  Hester Mates DNP ([place] in [facility] , [address]) GI:  [facility] in [facility] Hematology/ oncology:  Dr Marin Olp Cardiologist : no Chest x-ray :  EKG : 12-04-2018 epic Echo : 08-06-2009 epic Cardiac Cath :  no Sleep Study/ CPAP :  NO Fasting Blood Sugar :      / Checks Blood Sugar -- times a day:  N/A Blood Thinner/ Instructions Maryjane Hurter Dose: Xarelto/  Per pt was given instructions to stop 2 days prior to surgery/  Managed by Dr Marin Olp, clearance with chart ASA / Instructions/ Last Dose :  NO

## 2019-12-20 ENCOUNTER — Other Ambulatory Visit (HOSPITAL_COMMUNITY)
Admission: RE | Admit: 2019-12-20 | Discharge: 2019-12-20 | Disposition: A | Discharge status: Home / Self Care | Payer: BC Managed Care – PPO | Source: Ambulatory Visit | Attending: Urology | Admitting: Urology

## 2019-12-20 DIAGNOSIS — Z20822 Contact with and (suspected) exposure to covid-19: Secondary | ICD-10-CM | POA: Diagnosis not present

## 2019-12-20 DIAGNOSIS — Z01812 Encounter for preprocedural laboratory examination: Secondary | ICD-10-CM | POA: Diagnosis not present

## 2019-12-20 LAB — SARS CORONAVIRUS 2 (TAT 6-24 HRS): SARS Coronavirus 2: NEGATIVE

## 2019-12-22 ENCOUNTER — Other Ambulatory Visit: Payer: Self-pay | Admitting: Urology

## 2019-12-23 ENCOUNTER — Encounter (HOSPITAL_BASED_OUTPATIENT_CLINIC_OR_DEPARTMENT_OTHER): Payer: Self-pay | Admitting: Urology

## 2019-12-23 NOTE — Anesthesia Preprocedure Evaluation (Addendum)
Anesthesia Evaluation  Patient identified by MRN, date of birth, ID band Patient awake    Reviewed: Allergy & Precautions, NPO status , Patient's Chart, lab work & pertinent test results, reviewed documented beta blocker date and time   History of Anesthesia Complications (+) history of anesthetic complications  Airway Mallampati: II  TM Distance: >3 FB Neck ROM: Full    Dental  (+) Edentulous Upper, Edentulous Lower   Pulmonary neg pulmonary ROS, Current Smoker,    Pulmonary exam normal breath sounds clear to auscultation       Cardiovascular hypertension, + DVT  Normal cardiovascular exam Rhythm:Regular Rate:Normal  Hx/o aortic thrombus with embolization   Neuro/Psych PSYCHIATRIC DISORDERS Anxiety Depression Peripheral neuropathy right leg  Neuromuscular disease    GI/Hepatic Neg liver ROS, GERD  ,Hx/o fecal incontinence Crohn's disease   Endo/Other  negative endocrine ROS  Renal/GU Renal diseaseHx/o renal calculi Bladder dysfunction  Urinary incontinence    Musculoskeletal  (+) Arthritis , Osteoarthritis,    Abdominal (+) + obese,   Peds  Hematology  (+) anemia , Xarelto therapy- last dose 6/26   Anesthesia Other Findings   Reproductive/Obstetrics                           Anesthesia Physical Anesthesia Plan  ASA: III  Anesthesia Plan: General   Post-op Pain Management:    Induction: Intravenous  PONV Risk Score and Plan: 3 and Ondansetron, Treatment may vary due to age or medical condition, Scopolamine patch - Pre-op and Midazolam  Airway Management Planned: Oral ETT  Additional Equipment:   Intra-op Plan:   Post-operative Plan: Extubation in OR  Informed Consent: I have reviewed the patients History and Physical, chart, labs and discussed the procedure including the risks, benefits and alternatives for the proposed anesthesia with the patient or authorized  representative who has indicated his/her understanding and acceptance.     Dental advisory given  Plan Discussed with: CRNA and Surgeon  Anesthesia Plan Comments:        Anesthesia Quick Evaluation

## 2019-12-24 ENCOUNTER — Ambulatory Visit (HOSPITAL_COMMUNITY): Payer: BC Managed Care – PPO

## 2019-12-24 ENCOUNTER — Ambulatory Visit (HOSPITAL_BASED_OUTPATIENT_CLINIC_OR_DEPARTMENT_OTHER)
Admission: RE | Admit: 2019-12-24 | Discharge: 2019-12-24 | Disposition: A | Discharge status: Home / Self Care | Payer: BC Managed Care – PPO | Attending: Urology | Admitting: Urology

## 2019-12-24 ENCOUNTER — Encounter (HOSPITAL_BASED_OUTPATIENT_CLINIC_OR_DEPARTMENT_OTHER)
Admission: RE | Disposition: A | Discharge status: Home / Self Care | Payer: Self-pay | Source: Home / Self Care | Attending: Urology

## 2019-12-24 ENCOUNTER — Ambulatory Visit (HOSPITAL_BASED_OUTPATIENT_CLINIC_OR_DEPARTMENT_OTHER): Payer: BC Managed Care – PPO | Admitting: Physician Assistant

## 2019-12-24 ENCOUNTER — Other Ambulatory Visit: Payer: Self-pay

## 2019-12-24 ENCOUNTER — Encounter (HOSPITAL_BASED_OUTPATIENT_CLINIC_OR_DEPARTMENT_OTHER): Payer: Self-pay | Admitting: Urology

## 2019-12-24 DIAGNOSIS — M1990 Unspecified osteoarthritis, unspecified site: Secondary | ICD-10-CM | POA: Diagnosis not present

## 2019-12-24 DIAGNOSIS — N3946 Mixed incontinence: Secondary | ICD-10-CM | POA: Insufficient documentation

## 2019-12-24 DIAGNOSIS — F17210 Nicotine dependence, cigarettes, uncomplicated: Secondary | ICD-10-CM | POA: Insufficient documentation

## 2019-12-24 DIAGNOSIS — Z86718 Personal history of other venous thrombosis and embolism: Secondary | ICD-10-CM | POA: Diagnosis not present

## 2019-12-24 DIAGNOSIS — N3941 Urge incontinence: Secondary | ICD-10-CM | POA: Diagnosis present

## 2019-12-24 DIAGNOSIS — N183 Chronic kidney disease, stage 3 unspecified: Secondary | ICD-10-CM | POA: Insufficient documentation

## 2019-12-24 DIAGNOSIS — I129 Hypertensive chronic kidney disease with stage 1 through stage 4 chronic kidney disease, or unspecified chronic kidney disease: Secondary | ICD-10-CM | POA: Insufficient documentation

## 2019-12-24 DIAGNOSIS — Z7901 Long term (current) use of anticoagulants: Secondary | ICD-10-CM | POA: Diagnosis not present

## 2019-12-24 DIAGNOSIS — R159 Full incontinence of feces: Secondary | ICD-10-CM | POA: Insufficient documentation

## 2019-12-24 DIAGNOSIS — Z886 Allergy status to analgesic agent status: Secondary | ICD-10-CM | POA: Diagnosis not present

## 2019-12-24 HISTORY — DX: Presence of dental prosthetic device (complete) (partial): Z97.2

## 2019-12-24 HISTORY — DX: Mixed incontinence: N39.46

## 2019-12-24 HISTORY — PX: INTERSTIM IMPLANT PLACEMENT: SHX5130

## 2019-12-24 HISTORY — DX: Full incontinence of feces: R15.9

## 2019-12-24 LAB — POCT I-STAT, CHEM 8
BUN: 18 mg/dL (ref 6–20)
Calcium, Ion: 1.22 mmol/L (ref 1.15–1.40)
Chloride: 97 mmol/L — ABNORMAL LOW (ref 98–111)
Creatinine, Ser: 1.6 mg/dL — ABNORMAL HIGH (ref 0.44–1.00)
Glucose, Bld: 98 mg/dL (ref 70–99)
HCT: 56 % — ABNORMAL HIGH (ref 36.0–46.0)
Hemoglobin: 19 g/dL — ABNORMAL HIGH (ref 12.0–15.0)
Potassium: 5.1 mmol/L (ref 3.5–5.1)
Sodium: 140 mmol/L (ref 135–145)
TCO2: 36 mmol/L — ABNORMAL HIGH (ref 22–32)

## 2019-12-24 SURGERY — INSERTION, SACRAL NERVE STIMULATOR, INTERSTIM, STAGE 2
Anesthesia: General | Site: Back

## 2019-12-24 MED ORDER — FENTANYL CITRATE (PF) 100 MCG/2ML IJ SOLN: 25.0000 ug | INTRAMUSCULAR | Status: DC | PRN

## 2019-12-24 MED ORDER — LIDOCAINE 2% (20 MG/ML) 5 ML SYRINGE
INTRAMUSCULAR | Status: AC
  Filled 2019-12-24: qty 5

## 2019-12-24 MED ORDER — SODIUM CHLORIDE 0.9 % IV SOLN: INTRAVENOUS | Status: DC

## 2019-12-24 MED ORDER — ONDANSETRON HCL 4 MG/2ML IJ SOLN: 4.0000 mg | Freq: Once | INTRAMUSCULAR | Status: DC | PRN

## 2019-12-24 MED ORDER — DEXAMETHASONE SODIUM PHOSPHATE 10 MG/ML IJ SOLN
INTRAMUSCULAR | Status: DC | PRN
  Administered 2019-12-24: 10 mg via INTRAVENOUS

## 2019-12-24 MED ORDER — SUCCINYLCHOLINE CHLORIDE 200 MG/10ML IV SOSY
PREFILLED_SYRINGE | INTRAVENOUS | Status: DC | PRN
  Administered 2019-12-24: 100 mg via INTRAVENOUS

## 2019-12-24 MED ORDER — LIDOCAINE 2% (20 MG/ML) 5 ML SYRINGE
INTRAMUSCULAR | Status: DC | PRN
  Administered 2019-12-24: 80 mg via INTRAVENOUS

## 2019-12-24 MED ORDER — EPHEDRINE SULFATE-NACL 50-0.9 MG/10ML-% IV SOSY
PREFILLED_SYRINGE | INTRAVENOUS | Status: DC | PRN
  Administered 2019-12-24 (×3): 10 mg via INTRAVENOUS

## 2019-12-24 MED ORDER — FENTANYL CITRATE (PF) 100 MCG/2ML IJ SOLN
INTRAMUSCULAR | Status: DC | PRN
  Administered 2019-12-24: 100 ug via INTRAVENOUS

## 2019-12-24 MED ORDER — PROPOFOL 10 MG/ML IV BOLUS
INTRAVENOUS | Status: AC
  Filled 2019-12-24: qty 20

## 2019-12-24 MED ORDER — MIDAZOLAM HCL 5 MG/5ML IJ SOLN
INTRAMUSCULAR | Status: DC | PRN
  Administered 2019-12-24: 2 mg via INTRAVENOUS

## 2019-12-24 MED ORDER — OXYCODONE HCL 5 MG/5ML PO SOLN: 5.0000 mg | Freq: Once | ORAL | Status: DC | PRN

## 2019-12-24 MED ORDER — SUCCINYLCHOLINE CHLORIDE 200 MG/10ML IV SOSY
PREFILLED_SYRINGE | INTRAVENOUS | Status: AC
  Filled 2019-12-24: qty 10

## 2019-12-24 MED ORDER — CEFAZOLIN SODIUM-DEXTROSE 2-4 GM/100ML-% IV SOLN
INTRAVENOUS | Status: AC
  Filled 2019-12-24: qty 100

## 2019-12-24 MED ORDER — PROPOFOL 10 MG/ML IV BOLUS
INTRAVENOUS | Status: DC | PRN
  Administered 2019-12-24: 180 mg via INTRAVENOUS

## 2019-12-24 MED ORDER — PROPOFOL 500 MG/50ML IV EMUL
INTRAVENOUS | Status: AC
  Filled 2019-12-24: qty 50

## 2019-12-24 MED ORDER — EPHEDRINE 5 MG/ML INJ
INTRAVENOUS | Status: AC
  Filled 2019-12-24: qty 10

## 2019-12-24 MED ORDER — CEFAZOLIN SODIUM-DEXTROSE 2-4 GM/100ML-% IV SOLN
2.0000 g | Freq: Once | INTRAVENOUS | Status: AC
  Administered 2019-12-24: 2 g via INTRAVENOUS

## 2019-12-24 MED ORDER — ONDANSETRON HCL 4 MG/2ML IJ SOLN
INTRAMUSCULAR | Status: AC
  Filled 2019-12-24: qty 2

## 2019-12-24 MED ORDER — OXYCODONE HCL 5 MG PO TABS: 5.0000 mg | ORAL_TABLET | Freq: Once | ORAL | Status: DC | PRN

## 2019-12-24 MED ORDER — DEXAMETHASONE SODIUM PHOSPHATE 10 MG/ML IJ SOLN
INTRAMUSCULAR | Status: AC
  Filled 2019-12-24: qty 1

## 2019-12-24 MED ORDER — MIDAZOLAM HCL 2 MG/2ML IJ SOLN
INTRAMUSCULAR | Status: AC
  Filled 2019-12-24: qty 2

## 2019-12-24 MED ORDER — MEPERIDINE HCL 25 MG/ML IJ SOLN: 6.2500 mg | INTRAMUSCULAR | Status: DC | PRN

## 2019-12-24 MED ORDER — LIDOCAINE-EPINEPHRINE (PF) 1 %-1:200000 IJ SOLN
INTRAMUSCULAR | Status: DC | PRN
  Administered 2019-12-24: 18 mL

## 2019-12-24 MED ORDER — ONDANSETRON HCL 4 MG/2ML IJ SOLN
INTRAMUSCULAR | Status: DC | PRN
  Administered 2019-12-24: 4 mg via INTRAVENOUS

## 2019-12-24 MED ORDER — FENTANYL CITRATE (PF) 100 MCG/2ML IJ SOLN
INTRAMUSCULAR | Status: AC
  Filled 2019-12-24: qty 2

## 2019-12-24 SURGICAL SUPPLY — 51 items
BENZOIN TINCTURE PRP APPL 2/3 (GAUZE/BANDAGES/DRESSINGS) IMPLANT
BLADE HEX COATED 2.75 (ELECTRODE) ×3 IMPLANT
BLADE SURG 15 STRL LF DISP TIS (BLADE) ×1 IMPLANT
BLADE SURG 15 STRL SS (BLADE) ×3
CABLE TEST STIMULATION (UROLOGICAL SUPPLIES) ×3 IMPLANT
CABLE TWIST LOCK 25CM (UROLOGICAL SUPPLIES) IMPLANT
CHLORAPREP W/TINT 26 (MISCELLANEOUS) ×3 IMPLANT
CLOSURE WOUND 1/2 X4 (GAUZE/BANDAGES/DRESSINGS)
COVER BACK TABLE 60X90IN (DRAPES) ×3 IMPLANT
COVER MAYO STAND STRL (DRAPES) ×3 IMPLANT
COVER PROBE 5X48 (MISCELLANEOUS) ×3
COVER PROBE W GEL 5X96 (DRAPES) ×3 IMPLANT
COVER WAND RF STERILE (DRAPES) ×3 IMPLANT
DERMABOND ADVANCED (GAUZE/BANDAGES/DRESSINGS) ×2
DERMABOND ADVANCED .7 DNX12 (GAUZE/BANDAGES/DRESSINGS) ×1 IMPLANT
DRAPE C-ARM 42X120 X-RAY (DRAPES) ×3 IMPLANT
DRAPE C-ARM 42X72 X-RAY (DRAPES) IMPLANT
DRAPE C-ARMOR (DRAPES) ×3 IMPLANT
DRAPE INCISE IOBAN 66X45 STRL (DRAPES) ×3 IMPLANT
DRAPE LAPAROSCOPIC ABDOMINAL (DRAPES) ×3 IMPLANT
DRSG TEGADERM 4X4.75 (GAUZE/BANDAGES/DRESSINGS) ×3 IMPLANT
DRSG TELFA 3X8 NADH (GAUZE/BANDAGES/DRESSINGS) ×3 IMPLANT
ELECT REM PT RETURN 9FT ADLT (ELECTROSURGICAL)
ELECTRODE REM PT RTRN 9FT ADLT (ELECTROSURGICAL) IMPLANT
GAUZE SPONGE 4X4 12PLY STRL (GAUZE/BANDAGES/DRESSINGS) IMPLANT
GLOVE BIO SURGEON STRL SZ8 (GLOVE) ×3 IMPLANT
GOWN STRL REUS W/ TWL XL LVL3 (GOWN DISPOSABLE) ×1 IMPLANT
GOWN STRL REUS W/TWL XL LVL3 (GOWN DISPOSABLE) ×3
INTRODUCER GUIDE DILATR SHEATH (SET/KITS/TRAYS/PACK) IMPLANT
KIT CVR 48X5XPRB PLUP LF (MISCELLANEOUS) ×1 IMPLANT
KIT HANDSET INTERSTIM COMM (NEUROSURGERY SUPPLIES) ×3 IMPLANT
KIT TURNOVER CYSTO (KITS) ×3 IMPLANT
LEAD INTERSTIM 4.32 28 L (Lead) ×3 IMPLANT
NEEDLE FORAMEN 20GA 5  12.5CM (NEEDLE) IMPLANT
NEEDLE HYPO 22GX1.5 SAFETY (NEEDLE) ×3 IMPLANT
NEUROSTIMULATOR 1.7X2X.06 (UROLOGICAL SUPPLIES) ×3 IMPLANT
PENCIL BUTTON HOLSTER BLD 10FT (ELECTRODE) ×3 IMPLANT
PROGRAMMER STIMUL 2.2X1.1X3.7 (UROLOGICAL SUPPLIES) ×3 IMPLANT
SET BASIN DAY SURGERY F.S. (CUSTOM PROCEDURE TRAY) ×3 IMPLANT
STAPLER VISISTAT 35W (STAPLE) IMPLANT
STIMULATOR INTERSTIM 2X1.7X.3 (Miscellaneous) ×3 IMPLANT
STRIP CLOSURE SKIN 1/2X4 (GAUZE/BANDAGES/DRESSINGS) IMPLANT
SUT MNCRL AB 4-0 PS2 18 (SUTURE) ×3 IMPLANT
SUT VIC AB 3-0 SH 27 (SUTURE) ×6
SUT VIC AB 3-0 SH 27X BRD (SUTURE) ×2 IMPLANT
SUT VICRYL 4-0 PS2 18IN ABS (SUTURE) ×3 IMPLANT
SYR BULB IRRIG 60ML STRL (SYRINGE) IMPLANT
SYR CONTROL 10ML LL (SYRINGE) ×3 IMPLANT
TOWEL OR 17X26 10 PK STRL BLUE (TOWEL DISPOSABLE) ×6 IMPLANT
TUBE CONNECTING 12'X1/4 (SUCTIONS) ×1
TUBE CONNECTING 12X1/4 (SUCTIONS) ×2 IMPLANT

## 2019-12-24 NOTE — Discharge Instructions (Signed)

## 2019-12-24 NOTE — Op Note (Signed)
Operative Note  Preoperative diagnosis:  1.  Refractory urge incontinence 2.  Fecal incontinence  Postoperative diagnosis: 1.  Refractory urge incontinence 2.  Fecal incontinence  Procedure(s): Stage I and II InterStim placement  Surgeon: Ellison Hughs, MD  Assistants:  None  Anesthesia:  General  Complications:  None  EBL: 5 mL  Specimens: 1.  None  Drains/Catheters: 1.  None  Intraoperative findings:   1. Anal bellows and right plantar flexion was seen following lead placement in the right S3 foramen  Indication:  Kari Hahn is a 49 y.o. female with refractory urinary urge incontinence and fecal incontinence.  She underwent PNE with Dr. Koleen Distance, which resulted in excellent bladder and bowel control.  She is here today for stage I and stage II InterStim placement.  She has been consented for the above procedures, voices understanding and wishes to proceed.  Description of procedure:  After informed consent was obtained and signed the patient brought to the operating room. Gen. anesthesia was induced, she was placed in the prone position, and she was prepped and draped in the usual sterile fashion. A timeout was performed. The C-arm was positioned and AP images were used to map out the patient's bony anatomy. The medial edges of the foramina were identified and marked. A combination of AP and lateral fluoroscopic guidance was used throughout the procedure.  A 5 inch foramen needle was placed in the superior, medial aspect of the left S3 foramen and appropriate needle depth was visualized using fluoroscopy. Proper needle location was confirmed by direct observation of the bellowing reflex and plantar flexion. The foramen needle stylet was removed and a directional guide was placed through the needle using marks on the guide to assure appropriate depth. The foramen needle was removed by sliding over the directional guide. Small incision was made peripherally to  the directional guide through the skin. The lead introducer with dilator was placed over the directional guide and utilizing fluoroscopic guidance, the lead introducer was advanced until the radial opaque marker was halfway through the foramen. The dilator was removed along with the directional guide. Using fluoroscopy the tined lead with bent stylet was placed through the introducer. All 4 electrodes were tested, observing plantar flexion and bellows with lead 0, 1, 2 and 3. After satisfactory lead positioning was confirmed, the introducer was retracted over the lead under continuous fluoroscopy, deploying the tines into the presacral tissue.  The internal neurostimulator pocket site was identified below the iliac crest and lateral to the sacrum. An incision was made into the subcutaneous tissue and a combination of blunt dissection and electrocautery were used to create a pocket with good hemostasis. A tunneling tool with sheath was placed from the lead exit site subcutaneously to the incised pocket site. The tunneling tool was removed and the lead was fed through the sheath, exiting out the pocket site. The sheath was removed. The lead was inserted into the InterStim neurostimulator and a single set screw was tightened until audible clicks were heard. The neurostimulator was placed into the pocket. The clinician programmer telemetry head was placed over the implanted neurostimulator to ensure parameters were within normal range. The pocket was irrigated with sterile water and then closed with Vicryls and Monocryl. The wound was dressed with Dermabond. The patient was awoken from anesthesia and transported to recovery having tolerated the procedure well.   Plan: Follow-up on 01/08/2020 for a postoperative check

## 2019-12-24 NOTE — H&P (Signed)
Urology Preoperative H&P   Chief Complaint: Urinary urge and fecal incontinence  History of Present Illness: Kari Hahn is a 49 y.o. female with mixed bladder and bowel incontinence.  She recently underwent a PNE with Dr. Koleen Distance and she notes marked improvement in her fecal and urinary incontinence. Still on Xarelto for hx of DVT (managed by Dr. Burney Gauze). She continues to take DDAVP with slight improvement in nocturnal enuresis. Denies interval UTIs, dysuria or hematuria.    Past Medical History:  Diagnosis Date  . Abnormal uterine bleeding (AUB)   . Anemia, iron deficiency    followed by hemotologist-- dr Marin Olp--  due to chronic blood loss, abnormal uterine bleeding--- treatment IV Iron infusions  . Anemia, pernicious   . Anticoagulated    xarelto,  managed by dr Marin Olp  . Anxiety   . Arthritis    knees , back  . Borderline diabetes    does not check cbg at home  . CKD (chronic kidney disease), stage III   . Complication of anesthesia    "if anesthesia is strong I have a hard time of waking up."   . Crohn disease ([place]) 09/2007   followed by GI w/ [facility] in [place]/  treatment Humira   . Fecal incontinence   . GERD (gastroesophageal reflux disease)   . 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 History of gastric ulcer    2009--- esophagus and gastrium ulcers, candida  . History of kidney stones   . 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 Hypertension    followed by pcp    (12-17-2019  per pt never had approx. 10 yrs ago, unsure what was told but nothing else done)  . 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 Mixed stress and urge urinary incontinence    . Neuropathy of leg    right leg  . Wears dentures    12-17-2019  currently not wearing them  . Wears glasses     Past Surgical History:  Procedure Laterality Date  . BOTOX INJECTION N/A 06/11/2018   Procedure: BOTOX INJECTION WITH CYSTOSCOPY;  Surgeon: Ceasar Mons, MD;  Location: [facility];  Service: Urology;  Laterality: N/A;  . BOTOX INJECTION N/A 12/04/2018   Procedure: BOTOX INJECTION WITH CYSTOSCOPY WITH FULGERATION;  Surgeon: Ceasar Mons, MD;  Location: [facility];  Service: Urology;  Laterality: N/A;  . BOTOX INJECTION N/A 05/28/2019   Procedure: BOTOX INJECTION WITH CYSTOSCOPY/BOTOX 200 UNITS;  Surgeon: Ceasar Mons, MD;  Location: [facility];  Service: Urology;  Laterality: N/A;  . CESAREAN SECTION  x2  last one 1998  . CHOLECYSTECTOMY  03/29/2012   Procedure: LAPAROSCOPIC CHOLECYSTECTOMY WITH INTRAOPERATIVE CHOLANGIOGRAM;  Surgeon: Odis Hollingshead, MD;  Location: [facility];  Service: General;  Laterality: N/A;  laparoscopic cholecystectomy with intraopertaive choloangiogram  . CYSTOSCOPY N/A 06/11/2018   Procedure: Consuela Mimes;  Surgeon: Ceasar Mons, MD;  Location: [facility];  Service: Urology;  Laterality: N/A;  . CYSTOSCOPY W/ URETERAL STENT PLACEMENT  2005  APPROX.  Marland Kitchen CYSTOSCOPY/URETEROSCOPY/HOLMIUM LASER/STENT PLACEMENT Left 08/21/2017   Procedure: CYSTOSCOPY/RETROGRADE/URETEROSCOPY/HOLMIUM LASER/STENT PLACEMENT;  Surgeon: Lovena Neighbours,  Conception Oms, MD;  Location: [facility];  Service: Urology;  Laterality: Left;  . EXTRACORPOREAL SHOCK WAVE LITHOTRIPSY  2005 approx.  Marland Kitchen KNEE SURGERY Left age 60  . LAPAROSCOPIC NISSEN FUNDOPLICATION  7209  approx.  . TRANSTHORACIC ECHOCARDIOGRAM  08/06/2009   ef 47%, grade 1 diastolic dysfunction/  mild LAE    Allergies:  Allergies  Allergen Reactions  . Ibuprofen Nausea Only    History reviewed. No  pertinent family history.  Social History:  reports that she has been smoking cigarettes. She started smoking about 34 years ago. She has a 70.00 pack-year smoking history. She has never used smokeless tobacco. She reports previous drug use. She reports that she does not drink alcohol.  ROS: A complete review of systems was performed.  All systems are negative except for pertinent findings as noted.  Physical Exam:  Vital signs in last 24 hours:   Constitutional:  Alert and oriented, No acute distress Cardiovascular: Regular rate and rhythm, No JVD Respiratory: Normal respiratory effort, Lungs clear bilaterally GI: Abdomen is soft, nontender, nondistended, no abdominal masses GU: No CVA tenderness Lymphatic: No lymphadenopathy Neurologic: Grossly intact, no focal deficits Psychiatric: Normal mood and affect  Laboratory Data:  No results for input(s): WBC, HGB, HCT, PLT in the last 72 hours.  No results for input(s): NA, K, CL, GLUCOSE, BUN, CALCIUM, CREATININE in the last 72 hours.  Invalid input(s): CO3   No results found for this or any previous visit (from the past 24 hour(s)). Recent Results (from the past 240 hour(s))  SARS CORONAVIRUS 2 (TAT 6-24 HRS) Nasopharyngeal Nasopharyngeal Swab     Status: None   Collection Time: 12/20/19 10:00 AM   Specimen: Nasopharyngeal Swab  Result Value Ref Range Status   SARS Coronavirus 2 NEGATIVE NEGATIVE Final    Comment: (NOTE) SARS-CoV-2 target nucleic acids are NOT DETECTED.  The SARS-CoV-2 RNA is generally detectable in upper and lower respiratory specimens during the acute phase of infection. Negative results do not preclude SARS-CoV-2 infection, do not rule out co-infections with other pathogens, and should not be used as the sole basis for treatment or other patient management decisions. Negative results must be combined with clinical observations, patient history, and epidemiological information. The expected result is  Negative.  Fact Sheet for Patients: SugarRoll.be  Fact Sheet for Healthcare Providers: https://www.woods-mathews.com/  This test is not yet approved or cleared by the [place] FDA and  has been authorized for detection and/or diagnosis of SARS-CoV-2 by FDA under an Emergency Use Authorization (EUA). This EUA will remain  in effect (meaning this test can be used) for the duration of the COVID-19 declaration under Se ction 564(b)(1) of the Act, 21 U.S.C. section 360bbb-3(b)(1), unless the authorization is terminated or revoked sooner.  Performed at [facility], [place] [address] [place], [place] [address]     Renal Function: No results for input(s): CREATININE in the last 168 hours. CrCl cannot be calculated (Patient's most recent lab result is older than the maximum 21 days allowed.).  Radiologic Imaging: No results found.  I independently reviewed the above imaging studies.  Assessment and Plan LEILA SCHUFF is a 49 y.o. female with urinary urge and fecal incontinence refractory to OAB medications  -Interstim sacral neuromodulator placement was discussed with the patient. Risks include, but not limited to bleeding, wound infection, battery dysfunction, lead migration, rectal injury, continued bladder and/or bowel incontinence, MI, CVA, DVT and the inherent risk of general anesthesia. She voices  understanding wishes to proceed.  Ellison Hughs, MD 12/24/2019, 7:08 AM  [facility] Pager: [phone]

## 2019-12-24 NOTE — Anesthesia Procedure Notes (Signed)
Procedure Name: Intubation Date/Time: 12/24/2019 9:03 AM Performed by: Lory Nowaczyk D, CRNA Pre-anesthesia Checklist: Patient identified, Emergency Drugs available, Suction available and Patient being monitored Patient Re-evaluated:Patient Re-evaluated prior to induction Oxygen Delivery Method: Circle system utilized Preoxygenation: Pre-oxygenation with 100% oxygen Induction Type: IV induction Ventilation: Mask ventilation without difficulty Laryngoscope Size: Mac and 4 Grade View: Grade I Tube type: Oral Tube size: 7.0 mm Number of attempts: 1 Airway Equipment and Method: Stylet Placement Confirmation: ETT inserted through vocal cords under direct vision,  positive ETCO2 and breath sounds checked- equal and bilateral Secured at: 20 cm Tube secured with: Tape Dental Injury: Teeth and Oropharynx as per pre-operative assessment

## 2019-12-24 NOTE — Transfer of Care (Addendum)
Immediate Anesthesia Transfer of Care Note  Patient: Kari Hahn  Procedure(s) Performed: Barrie Lyme IMPLANT SECOND STAGE (N/A Back) INTERSTIM IMPLANT FIRST STAGE (N/A Back)  Patient Location: PACU  Anesthesia Type:General  Level of Consciousness: awake, alert  and oriented  Airway & Oxygen Therapy: Patient Spontanous Breathing and Patient connected to face mask oxygen  Post-op Assessment: Report given to RN and Post -op Vital signs reviewed and stable  Post vital signs: Reviewed and stable  Last Vitals:  Vitals Value Taken Time  BP 159/100 12/24/19 1022  Temp    Pulse 105 12/24/19 1024  Resp 23 12/24/19 1024  SpO2 92 % 12/24/19 1024  Vitals shown include unvalidated device data.  Last Pain:  Vitals:   12/24/19 0750  TempSrc: Oral         Complications: No complications documented.

## 2019-12-24 NOTE — Anesthesia Postprocedure Evaluation (Signed)
Anesthesia Post Note  Patient: NORRINE BALLESTER  Procedure(s) Performed: Barrie Lyme IMPLANT SECOND STAGE (N/A Back) INTERSTIM IMPLANT FIRST STAGE (N/A Back)     Patient location during evaluation: PACU Anesthesia Type: General Level of consciousness: awake and alert and oriented Pain management: pain level controlled Vital Signs Assessment: post-procedure vital signs reviewed and stable Respiratory status: spontaneous breathing, nonlabored ventilation and respiratory function stable Cardiovascular status: blood pressure returned to baseline and stable Postop Assessment: no apparent nausea or vomiting Anesthetic complications: no   No complications documented.  Last Vitals:  Vitals:   12/24/19 1100 12/24/19 1115  BP: (!) 156/87 (!) 166/78  Pulse: 96 90  Resp: (!) 23 13  Temp:    SpO2: 91% 91%    Last Pain:  Vitals:   12/24/19 1100  TempSrc:   PainSc: 2                  Caeleb Batalla A.

## 2019-12-30 ENCOUNTER — Encounter (HOSPITAL_BASED_OUTPATIENT_CLINIC_OR_DEPARTMENT_OTHER): Payer: Self-pay | Admitting: Urology

## 2020-01-12 ENCOUNTER — Telehealth: Payer: Self-pay

## 2020-01-12 NOTE — Telephone Encounter (Signed)
Patient called asking if she can continue holding her xarelto longer as she is still bleeding from insertion site. States she stopped taking in on 01/08/2020 due to bleeding and bruising.  States she had been off and on it per her Urologist since the surgery due to bleeding as well. Surgery was 12/24/2019. Informed patient we will call back after discussing with Dr.Ennever.   Per Dr.Ennever patient to stop Xarelto until Monday 01/19/2020.   She has a follow up with Urologist on Tuesday next week as well.

## 2020-03-02 ENCOUNTER — Telehealth: Payer: Self-pay | Admitting: *Deleted

## 2020-03-02 ENCOUNTER — Other Ambulatory Visit: Payer: Self-pay | Admitting: Family

## 2020-03-02 DIAGNOSIS — I82431 Acute embolism and thrombosis of right popliteal vein: Secondary | ICD-10-CM

## 2020-03-02 DIAGNOSIS — M7989 Other specified soft tissue disorders: Secondary | ICD-10-CM

## 2020-03-02 NOTE — Telephone Encounter (Signed)
Call received from patient stating that she has a new pain to her right lower leg which started approx one week ago.  Pt denies any edema or redness to that area.  Jory Ee NP notified and order received for pt to come in for a doppler of right lower leg.  Pt notified and message sent to scheduling.

## 2020-03-03 ENCOUNTER — Ambulatory Visit (HOSPITAL_BASED_OUTPATIENT_CLINIC_OR_DEPARTMENT_OTHER)
Admission: RE | Admit: 2020-03-03 | Discharge: 2020-03-03 | Disposition: A | Discharge status: Home / Self Care | Payer: Managed Care, Other (non HMO) | Source: Ambulatory Visit | Attending: Family | Admitting: Family

## 2020-03-03 ENCOUNTER — Other Ambulatory Visit: Payer: Self-pay

## 2020-03-03 ENCOUNTER — Telehealth: Payer: Self-pay | Admitting: *Deleted

## 2020-03-03 DIAGNOSIS — M7989 Other specified soft tissue disorders: Secondary | ICD-10-CM | POA: Insufficient documentation

## 2020-03-03 DIAGNOSIS — I82431 Acute embolism and thrombosis of right popliteal vein: Secondary | ICD-10-CM | POA: Diagnosis not present

## 2020-03-03 NOTE — Telephone Encounter (Signed)
-----   Message from Eliezer Bottom, NP sent at 03/03/2020 10:58 AM EDT ----- No DVT!!!!! WOO HOO!!!!!!!  ----- Message ----- From: Interface, Rad Results In Sent: 03/03/2020  10:49 AM EDT To: Eliezer Bottom, NP

## 2020-03-03 NOTE — Telephone Encounter (Signed)
As noted below by [name], NP, I informed the patient that there is no DVT. She verbalized understanding.

## 2020-03-12 ENCOUNTER — Inpatient Hospital Stay: Payer: Managed Care, Other (non HMO) | Admitting: Family

## 2020-03-12 ENCOUNTER — Inpatient Hospital Stay: Payer: Managed Care, Other (non HMO) | Attending: Family

## 2020-03-12 ENCOUNTER — Other Ambulatory Visit (HOSPITAL_BASED_OUTPATIENT_CLINIC_OR_DEPARTMENT_OTHER): Payer: BC Managed Care – PPO

## 2020-03-29 ENCOUNTER — Telehealth: Payer: Self-pay | Admitting: Family

## 2020-03-29 NOTE — Telephone Encounter (Signed)
I called and spoke with patient about rs her 10/8 appt due to provider being out of the office  She was ok with both date/time of 10/7 for new appointments

## 2020-04-01 ENCOUNTER — Ambulatory Visit: Payer: Managed Care, Other (non HMO) | Admitting: Family

## 2020-04-01 ENCOUNTER — Other Ambulatory Visit: Payer: Managed Care, Other (non HMO)

## 2020-04-02 ENCOUNTER — Inpatient Hospital Stay: Payer: Managed Care, Other (non HMO)

## 2020-04-02 ENCOUNTER — Inpatient Hospital Stay: Payer: Managed Care, Other (non HMO) | Admitting: Family

## 2020-04-07 ENCOUNTER — Other Ambulatory Visit: Payer: Self-pay

## 2020-04-07 ENCOUNTER — Inpatient Hospital Stay (HOSPITAL_BASED_OUTPATIENT_CLINIC_OR_DEPARTMENT_OTHER): Payer: Managed Care, Other (non HMO) | Admitting: Family

## 2020-04-07 ENCOUNTER — Encounter: Payer: Self-pay | Admitting: Family

## 2020-04-07 ENCOUNTER — Inpatient Hospital Stay: Payer: Managed Care, Other (non HMO) | Attending: Family

## 2020-04-07 VITALS — BP 144/90 | HR 84 | Temp 98.5°F | Resp 18 | Ht 62.0 in | Wt 205.0 lb

## 2020-04-07 DIAGNOSIS — I82431 Acute embolism and thrombosis of right popliteal vein: Secondary | ICD-10-CM

## 2020-04-07 DIAGNOSIS — D500 Iron deficiency anemia secondary to blood loss (chronic): Secondary | ICD-10-CM

## 2020-04-07 DIAGNOSIS — D509 Iron deficiency anemia, unspecified: Secondary | ICD-10-CM | POA: Insufficient documentation

## 2020-04-07 LAB — CBC WITH DIFFERENTIAL (CANCER CENTER ONLY)
Abs Immature Granulocytes: 0.31 10*3/uL — ABNORMAL HIGH (ref 0.00–0.07)
Basophils Absolute: 0.1 10*3/uL (ref 0.0–0.1)
Basophils Relative: 0 %
Eosinophils Absolute: 0.1 10*3/uL (ref 0.0–0.5)
Eosinophils Relative: 1 %
HCT: 48.9 % — ABNORMAL HIGH (ref 36.0–46.0)
Hemoglobin: 16.3 g/dL — ABNORMAL HIGH (ref 12.0–15.0)
Immature Granulocytes: 2 %
Lymphocytes Relative: 13 %
Lymphs Abs: 2.2 10*3/uL (ref 0.7–4.0)
MCH: 36.5 pg — ABNORMAL HIGH (ref 26.0–34.0)
MCHC: 33.3 g/dL (ref 30.0–36.0)
MCV: 109.6 fL — ABNORMAL HIGH (ref 80.0–100.0)
Monocytes Absolute: 1.1 10*3/uL — ABNORMAL HIGH (ref 0.1–1.0)
Monocytes Relative: 7 %
Neutro Abs: 12.4 10*3/uL — ABNORMAL HIGH (ref 1.7–7.7)
Neutrophils Relative %: 77 %
Platelet Count: 385 10*3/uL (ref 150–400)
RBC: 4.46 MIL/uL (ref 3.87–5.11)
RDW: 17.3 % — ABNORMAL HIGH (ref 11.5–15.5)
WBC Count: 16.2 10*3/uL — ABNORMAL HIGH (ref 4.0–10.5)
nRBC: 0.1 % (ref 0.0–0.2)

## 2020-04-07 LAB — RETICULOCYTES
Immature Retic Fract: 30.4 % — ABNORMAL HIGH (ref 2.3–15.9)
RBC.: 4.46 MIL/uL (ref 3.87–5.11)
Retic Count, Absolute: 140.9 10*3/uL (ref 19.0–186.0)
Retic Ct Pct: 3.2 % — ABNORMAL HIGH (ref 0.4–3.1)

## 2020-04-07 NOTE — Progress Notes (Signed)
Hematology and Oncology Follow Up Visit  Kari Hahn [id number] June 17, 1971 49 y.o. 04/07/2020   Principle Diagnosis:  Right lower extremity DVT Idiopathic aortic thrombosis Pernicious anemia Iron deficiency anemia Crohn's disease  Current Therapy: Xarelto 20 mg PO daily Vitamin B12 PO BID daily IV iron as needed   Interim History:  Kari Hahn is here today for follow-up. She was recently properly diagnosed with COPD and as decided to try and quit smoking. She is still smoking quite heavily at this time.  She has a wound on the inside of her right buttock that has been oozing puss and blood. She has seen her PCP and they were able to clean it and has started her on Cipro.  No fever, chills, rash, dizziness, palpitations or changes in bladder habits.  She has had some episodes of chest discomfort and pain and discoloration in her toes. She states that she has an appointment with her cardiologist tomorrow for further work up for cause of these symptoms.  Hgb today is 16.3, WBC count 16.2, platelets 385. Iron studies are pending.  No other blood loss outside of her wound as mentioned above. No bruising or petechiae.  She is still on steroids for Crohn's flare. She has some facial swelling (moon face) with this.  She has diarrhea consistently. She received her first dose of Stelara lat month but has not yet been able to note a difference in her symptoms.  No swelling or tenderness in her extremities.  The neuropathy in her feet is unchanged.  No falls or syncopal episodes to report.  She is eating well and doing her best to stay well hydrated. Her weight is stable.   ECOG Performance Status: 1 - Symptomatic but completely ambulatory  Medications:  Allergies as of 04/07/2020      Reactions   Ibuprofen Nausea Only      Medication List       Accurate as of April 07, 2020  2:09 PM. If you have any questions, ask your nurse or doctor.        STOP taking these  medications   Humira Pen 40 MG/0.4ML Pnkt Generic drug: Adalimumab Stopped by: Kari Peace, NP   prochlorperazine 10 MG tablet Commonly known as: COMPAZINE Stopped by: Kari Peace, NP     TAKE these medications   benzonatate 100 MG capsule Commonly known as: TESSALON Take 100 mg by mouth 4 (four) times daily as needed.   ciprofloxacin 500 MG tablet Commonly known as: CIPRO Take 500 mg by mouth 2 (two) times daily.   desmopressin 0.2 MG tablet Commonly known as: DDAVP Take 0.4 mg by mouth at bedtime. 09/02/18-BID per pt   Exforge 5-160 MG tablet Generic drug: amLODipine-valsartan 1 TABLET BY MOUTH DAILY FOR BLOOD PRESSURE--- takes in am after eaten   ezetimibe 10 MG tablet Commonly known as: ZETIA Take 10 mg by mouth daily.   fluticasone 50 MCG/ACT nasal spray Commonly known as: FLONASE daily as needed.   folic acid 1 MG tablet Commonly known as: FOLVITE Take 1 mg by mouth daily.   gabapentin 800 MG tablet Commonly known as: NEURONTIN Take 800 mg by mouth 3 (three) times daily.   Lexapro 20 MG tablet Generic drug: escitalopram Take 40 mg by mouth at bedtime. 2  20 mg tab = 40 mg daily   nitrofurantoin (macrocrystal-monohydrate) 100 MG capsule Commonly known as: MACROBID Take 100 mg by mouth every evening.   ondansetron 8 MG tablet Commonly known as: ZOFRAN TAKE  1 TABLET BY MOUTH 2 TIMES A DAY What changed:   when to take this  reasons to take this   oxyCODONE-acetaminophen 10-325 MG tablet Commonly known as: PERCOCET Take 1 tablet by mouth 4 (four) times daily. Pt takes on a schedule   pantoprazole 40 MG tablet Commonly known as: PROTONIX Take 40 mg by mouth every evening.   potassium chloride SA 20 MEQ tablet Commonly known as: KLOR-CON Take 20 mEq by mouth every evening.   predniSONE 10 MG tablet Commonly known as: DELTASONE Take 15 mg by mouth daily with lunch.   ProAir HFA 108 (90 Base) MCG/ACT inhaler Generic drug:  albuterol SMARTSIG:1 Puff(s) Via Inhaler 4 Times Daily PRN   VITAMIN B 12 PO Take by mouth daily.   vitamin C 1000 MG tablet Take 1,000 mg by mouth daily.   Vitamin D3 125 MCG (5000 UT) Tabs Take by mouth every morning.   Xarelto 10 MG Tabs tablet Generic drug: rivaroxaban TAKE 1 TABLET BY MOUTH EVERY DAY WITH SUPPER What changed: See the new instructions.   zinc gluconate 50 MG tablet Take 50 mg by mouth daily.       Allergies:  Allergies  Allergen Reactions   Ibuprofen Nausea Only    Past Medical History, Surgical history, Social history, and Family History were reviewed and updated.  Review of Systems: All other 10 point review of systems is negative.   Physical Exam:  height is 5\' 2"  (1.575 m) and weight is 205 lb (93 kg). Her oral temperature is 98.5 F (36.9 C). Her blood pressure is 144/90 (abnormal) and her pulse is 84. Her respiration is 18 and oxygen saturation is 95%.   Wt Readings from Last 3 Encounters:  04/07/20 205 lb (93 kg)  12/24/19 198 lb (89.8 kg)  12/12/19 199 lb (90.3 kg)    Ocular: Sclerae unicteric, pupils equal, round and reactive to light Ear-nose-throat: Oropharynx clear, dentition fair Lymphatic: No cervical or supraclavicular adenopathy Lungs no rales or rhonchi, good excursion bilaterally Heart regular rate and rhythm, no murmur appreciated Abd soft, nontender, positive bowel sounds MSK no focal spinal tenderness, no joint edema Neuro: non-focal, well-oriented, appropriate affect Breasts: Deferred   Lab Results  Component Value Date   WBC 16.2 (H) 04/07/2020   HGB 16.3 (H) 04/07/2020   HCT 48.9 (H) 04/07/2020   MCV 109.6 (H) 04/07/2020   PLT 385 04/07/2020   Lab Results  Component Value Date   FERRITIN 168 12/12/2019   IRON 83 12/12/2019   TIBC 336 12/12/2019   UIBC 253 12/12/2019   IRONPCTSAT 25 12/12/2019   Lab Results  Component Value Date   RETICCTPCT 3.2 (H) 04/07/2020   RBC 4.46 04/07/2020   RBC 4.46  04/07/2020   RETICCTABS 88.8 12/21/2014   No results found for: KPAFRELGTCHN, LAMBDASER, KAPLAMBRATIO No results found for: IGGSERUM, IGA, IGMSERUM No results found for: Odetta Pink, SPEI   Chemistry      Component Value Date/Time   NA 140 12/24/2019 0811   NA 141 03/12/2017 0929   K 5.1 12/24/2019 0811   K 2.8 (LL) 03/12/2017 0929   CL 97 (L) 12/24/2019 0811   CL 106 12/08/2011 1145   CO2 27 01/07/2019 0941   CO2 27 03/12/2017 0929   BUN 18 12/24/2019 0811   BUN 9.4 03/12/2017 0929   CREATININE 1.60 (H) 12/24/2019 0811   CREATININE 1.28 (H) 01/07/2019 0941   CREATININE 1.2 (H) 03/12/2017 8413  Component Value Date/Time   CALCIUM 9.3 01/07/2019 0941   CALCIUM 9.5 03/12/2017 0929   ALKPHOS 92 01/07/2019 0941   ALKPHOS 125 03/12/2017 0929   AST 12 (L) 01/07/2019 0941   AST 13 03/12/2017 0929   ALT 9 01/07/2019 0941   ALT 9 03/12/2017 0929   BILITOT 0.2 (L) 01/07/2019 0941   BILITOT <0.22 03/12/2017 0929       Impression and Plan: Ms. Stille is a very pleasant 49yo caucasian female withiron deficiency anemia due to GI blood loss with Crohn's. We will see what her iron studies look like and replace if needed.  Follow-up in 3 months.  She can contact our office with any questions or concerns.   Kari Peace, NP 10/13/20212:09 PM

## 2020-04-08 LAB — IRON AND TIBC
Iron: 74 ug/dL (ref 41–142)
Saturation Ratios: 19 % — ABNORMAL LOW (ref 21–57)
TIBC: 384 ug/dL (ref 236–444)
UIBC: 309 ug/dL (ref 120–384)

## 2020-04-08 LAB — FERRITIN: Ferritin: 66 ng/mL (ref 11–307)

## 2020-04-14 ENCOUNTER — Other Ambulatory Visit: Payer: Self-pay | Admitting: Family

## 2020-04-15 ENCOUNTER — Inpatient Hospital Stay: Payer: Managed Care, Other (non HMO)

## 2020-04-15 ENCOUNTER — Other Ambulatory Visit: Payer: Self-pay

## 2020-04-15 VITALS — BP 136/73 | HR 82 | Temp 98.5°F | Resp 17

## 2020-04-15 DIAGNOSIS — D509 Iron deficiency anemia, unspecified: Secondary | ICD-10-CM

## 2020-04-15 DIAGNOSIS — D500 Iron deficiency anemia secondary to blood loss (chronic): Secondary | ICD-10-CM

## 2020-04-15 MED ORDER — SODIUM CHLORIDE 0.9 % IV SOLN
200.0000 mg | Freq: Once | INTRAVENOUS | Status: AC
  Administered 2020-04-15: 200 mg via INTRAVENOUS
  Filled 2020-04-15: qty 200

## 2020-04-15 MED ORDER — SODIUM CHLORIDE 0.9 % IV SOLN
Freq: Once | INTRAVENOUS | Status: AC
  Filled 2020-04-15: qty 250

## 2020-04-15 NOTE — Patient Instructions (Signed)

## 2020-04-15 NOTE — Progress Notes (Signed)
Pt declined to stay for post infusion observation period. Pt stated she has tolerated medication multiple times prior without difficulty. Pt aware to call clinic with any questions or concerns. Pt verbalized understanding and had no further questions.  Pt discharged in stable condition.

## 2020-05-06 ENCOUNTER — Other Ambulatory Visit: Payer: Self-pay | Admitting: Family

## 2020-05-06 DIAGNOSIS — I749 Embolism and thrombosis of unspecified artery: Secondary | ICD-10-CM

## 2020-06-08 ENCOUNTER — Other Ambulatory Visit: Payer: Self-pay | Admitting: Gastroenterology

## 2020-06-08 DIAGNOSIS — K610 Anal abscess: Secondary | ICD-10-CM

## 2020-07-01 ENCOUNTER — Other Ambulatory Visit: Payer: Managed Care, Other (non HMO)

## 2020-07-06 ENCOUNTER — Other Ambulatory Visit: Payer: Managed Care, Other (non HMO)

## 2020-07-07 ENCOUNTER — Other Ambulatory Visit: Payer: Self-pay

## 2020-07-07 ENCOUNTER — Ambulatory Visit (HOSPITAL_BASED_OUTPATIENT_CLINIC_OR_DEPARTMENT_OTHER): Payer: Managed Care, Other (non HMO)

## 2020-07-07 ENCOUNTER — Inpatient Hospital Stay: Payer: Managed Care, Other (non HMO) | Attending: Family | Admitting: Family

## 2020-07-07 ENCOUNTER — Inpatient Hospital Stay: Payer: Managed Care, Other (non HMO)

## 2020-07-07 ENCOUNTER — Telehealth: Payer: Self-pay | Admitting: *Deleted

## 2020-07-07 ENCOUNTER — Encounter: Payer: Self-pay | Admitting: Family

## 2020-07-07 VITALS — BP 112/71 | HR 101 | Temp 98.7°F | Resp 20 | Wt 203.1 lb

## 2020-07-07 DIAGNOSIS — K5090 Crohn's disease, unspecified, without complications: Secondary | ICD-10-CM | POA: Diagnosis not present

## 2020-07-07 DIAGNOSIS — M7989 Other specified soft tissue disorders: Secondary | ICD-10-CM | POA: Diagnosis not present

## 2020-07-07 DIAGNOSIS — R1900 Intra-abdominal and pelvic swelling, mass and lump, unspecified site: Secondary | ICD-10-CM | POA: Diagnosis not present

## 2020-07-07 DIAGNOSIS — M79605 Pain in left leg: Secondary | ICD-10-CM

## 2020-07-07 DIAGNOSIS — K922 Gastrointestinal hemorrhage, unspecified: Secondary | ICD-10-CM | POA: Insufficient documentation

## 2020-07-07 DIAGNOSIS — R1084 Generalized abdominal pain: Secondary | ICD-10-CM | POA: Diagnosis not present

## 2020-07-07 DIAGNOSIS — D500 Iron deficiency anemia secondary to blood loss (chronic): Secondary | ICD-10-CM

## 2020-07-07 DIAGNOSIS — I82431 Acute embolism and thrombosis of right popliteal vein: Secondary | ICD-10-CM | POA: Diagnosis not present

## 2020-07-07 DIAGNOSIS — M79604 Pain in right leg: Secondary | ICD-10-CM

## 2020-07-07 DIAGNOSIS — D510 Vitamin B12 deficiency anemia due to intrinsic factor deficiency: Secondary | ICD-10-CM | POA: Insufficient documentation

## 2020-07-07 LAB — CBC WITH DIFFERENTIAL (CANCER CENTER ONLY)
Abs Immature Granulocytes: 0.27 10*3/uL — ABNORMAL HIGH (ref 0.00–0.07)
Basophils Absolute: 0.1 10*3/uL (ref 0.0–0.1)
Basophils Relative: 1 %
Eosinophils Absolute: 0.2 10*3/uL (ref 0.0–0.5)
Eosinophils Relative: 2 %
HCT: 50.7 % — ABNORMAL HIGH (ref 36.0–46.0)
Hemoglobin: 17.3 g/dL — ABNORMAL HIGH (ref 12.0–15.0)
Immature Granulocytes: 2 %
Lymphocytes Relative: 10 %
Lymphs Abs: 1.3 10*3/uL (ref 0.7–4.0)
MCH: 36.1 pg — ABNORMAL HIGH (ref 26.0–34.0)
MCHC: 34.1 g/dL (ref 30.0–36.0)
MCV: 105.8 fL — ABNORMAL HIGH (ref 80.0–100.0)
Monocytes Absolute: 0.9 10*3/uL (ref 0.1–1.0)
Monocytes Relative: 7 %
Neutro Abs: 10.2 10*3/uL — ABNORMAL HIGH (ref 1.7–7.7)
Neutrophils Relative %: 78 %
Platelet Count: 350 10*3/uL (ref 150–400)
RBC: 4.79 MIL/uL (ref 3.87–5.11)
RDW: 16.6 % — ABNORMAL HIGH (ref 11.5–15.5)
WBC Count: 13 10*3/uL — ABNORMAL HIGH (ref 4.0–10.5)
nRBC: 0 % (ref 0.0–0.2)

## 2020-07-07 LAB — RETICULOCYTES
Immature Retic Fract: 26.8 % — ABNORMAL HIGH (ref 2.3–15.9)
RBC.: 4.73 MIL/uL (ref 3.87–5.11)
Retic Count, Absolute: 108.8 10*3/uL (ref 19.0–186.0)
Retic Ct Pct: 2.3 % (ref 0.4–3.1)

## 2020-07-07 NOTE — Progress Notes (Signed)
07/07/2020 States received 2 [employer] vaccines. Unsure of dates.

## 2020-07-07 NOTE — Progress Notes (Unsigned)
Hematology and Oncology Follow Up Visit  Kari Hahn [id number] 07-29-1970 50 y.o. 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 She is having pain, swelling and redness in both lower extremities that started around Christmas and has slowly gotten worse. Pedal pulses are 2+ and 2+ pitting edema is. She also has left abdominal swelling and pain for the last 4 days.   She has been in a Chron's flare for months now and on Prednisone. She states that last week she stopped the prednisone becuase she did not like how it made her feel.  She has noted occasional bright red blood in her stool. No other blood loss noted. No abnormal bruising, no petechiae.  She states that she has not had a cycle in over a year.  She states that she has 2 abscesses on her buttock one of which is attached to her rectum. She is currently taking Cipro and Flagyl with her PCP at [facility].  She has SOB with exertion and states that she is down to smoking 1 ppd.  No fever, n/v, cough, rash, dizziness, chest pain, palpitations or changes in bladder habits.  She states that she is cold natured.  She has numbness and tingling that waxes and wanes in her feet.  No falls or syncopal episodes to report.  She has a fairly good appetite and is staying hydrated. Her weight is stable at 203 lbs.   ECOG Performance Status: 1 - Symptomatic but completely ambulatory  Medications:  Allergies as of 07/07/2020      Reactions   Ibuprofen Nausea Only      Medication List       Accurate as of July 07, 2020  1:17 PM. If you have any questions, ask your nurse or doctor.         benzonatate 100 MG capsule Commonly known as: TESSALON Take 100 mg by mouth 4 (four) times daily as needed.   ciprofloxacin 500 MG tablet Commonly known as: CIPRO Take 500 mg by mouth 2 (two) times daily.   desmopressin 0.2 MG tablet Commonly known as: DDAVP Take 0.4 mg by mouth at bedtime. 09/02/18-BID per pt   Exforge 5-160 MG tablet Generic drug: amLODipine-valsartan 1 TABLET BY MOUTH DAILY FOR BLOOD PRESSURE--- takes in am after eaten   ezetimibe 10 MG tablet Commonly known as: ZETIA Take 10 mg by mouth daily.   fluticasone 50 MCG/ACT nasal spray Commonly known as: FLONASE daily as needed.   folic acid 1 MG tablet Commonly known as: FOLVITE Take 1 mg by mouth daily.   gabapentin 800 MG tablet Commonly known as: NEURONTIN Take 800 mg by mouth 3 (three) times daily.   Lexapro 20 MG tablet Generic drug: escitalopram Take 40 mg by mouth at bedtime. 2  20 mg tab = 40 mg daily   nitrofurantoin (macrocrystal-monohydrate) 100 MG capsule Commonly known as: MACROBID Take 100 mg by mouth every evening.   ondansetron 8 MG tablet Commonly known as: ZOFRAN TAKE 1 TABLET BY MOUTH 2 TIMES A DAY What changed:   when to take this  reasons to take this   oxyCODONE-acetaminophen 10-325 MG tablet Commonly known as: PERCOCET Take 1 tablet  by mouth 4 (four) times daily. Pt takes on a schedule   pantoprazole 40 MG tablet Commonly known as: PROTONIX Take 40 mg by mouth every evening.   potassium chloride SA 20 MEQ tablet Commonly known as: KLOR-CON Take 20 mEq by mouth every evening.   predniSONE 10 MG tablet Commonly known as: DELTASONE Take 15 mg by mouth daily with lunch.   ProAir HFA 108 (90 Base) MCG/ACT inhaler Generic drug: albuterol SMARTSIG:1 Puff(s) Via Inhaler 4 Times Daily PRN   VITAMIN B 12 PO Take by mouth daily.   vitamin C 1000 MG tablet Take 1,000 mg by mouth daily.   Vitamin D3 125 MCG (5000 UT) Tabs Take by mouth every morning.   Xarelto 10  MG Tabs tablet Generic drug: rivaroxaban TAKE 1 TABLET BY MOUTH EVERY DAY WITH SUPPER   zinc gluconate 50 MG tablet Take 50 mg by mouth daily.       Allergies:  Allergies  Allergen Reactions  . Ibuprofen Nausea Only    Past Medical History, Surgical history, Social history, and Family History were reviewed and updated.  Review of Systems: All other 10 point review of systems is negative.   Physical Exam:  vitals were not taken for this visit.   Wt Readings from Last 3 Encounters:  04/07/20 205 lb (93 kg)  12/24/19 198 lb (89.8 kg)  12/12/19 199 lb (90.3 kg)    Ocular: Sclerae unicteric, pupils equal, round and reactive to light Ear-nose-throat: Oropharynx clear, dentition fair Lymphatic: No cervical or supraclavicular adenopathy Lungs no rales or rhonchi, good excursion bilaterally Heart regular rate and rhythm, no murmur appreciated Abd soft, nontender, positive bowel sounds MSK no focal spinal tenderness, no joint edema Neuro: non-focal, well-oriented, appropriate affect Breasts: Deferred   Lab Results  Component Value Date   WBC 16.2 (H) 04/07/2020   HGB 16.3 (H) 04/07/2020   HCT 48.9 (H) 04/07/2020   MCV 109.6 (H) 04/07/2020   PLT 385 04/07/2020   Lab Results  Component Value Date   FERRITIN 66 04/07/2020   IRON 74 04/07/2020   TIBC 384 04/07/2020   UIBC 309 04/07/2020   IRONPCTSAT 19 (L) 04/07/2020   Lab Results  Component Value Date   RETICCTPCT 3.2 (H) 04/07/2020   RBC 4.46 04/07/2020   RBC 4.46 04/07/2020   RETICCTABS 88.8 12/21/2014   No results found for: KPAFRELGTCHN, LAMBDASER, KAPLAMBRATIO No results found for: Kari Hahn, IGMSERUM No results found for: Kari Hahn, SPEI   Chemistry      Component Value Date/Time   NA 140 12/24/2019 0811   NA 141 03/12/2017 0929   K 5.1 12/24/2019 0811   K 2.8 (LL) 03/12/2017 0929   CL 97 (L) 12/24/2019 0811   CL 106 12/08/2011 1145   CO2  27 01/07/2019 0941   CO2 27 03/12/2017 0929   BUN 18 12/24/2019 0811   BUN 9.4 03/12/2017 0929   CREATININE 1.60 (H) 12/24/2019 0811   CREATININE 1.28 (H) 01/07/2019 0941   CREATININE 1.2 (H) 03/12/2017 0929      Component Value Date/Time   CALCIUM 9.3 01/07/2019 0941   CALCIUM 9.5 03/12/2017 0929   ALKPHOS 92 01/07/2019 0941   ALKPHOS 125 03/12/2017 0929   AST 12 (L) 01/07/2019 0941   AST 13 03/12/2017 0929   ALT 9 01/07/2019 0941   ALT 9 03/12/2017 0929   BILITOT 0.2 (L) 01/07/2019 0941   BILITOT <0.22 03/12/2017 5366  Impression and Plan: Ms. Foeller is a very pleasant 50yo caucasian female withiron deficiency anemia due to GI blood loss with Crohn's. Iron studies are pending. Her Hgb is up at 17.3, MCV 105, WBC count 13.0 and platelets 350.  We will get US of the abdomen as well as both legs to assess for causes of pain and swelling.   We will go ahead and plan to see her again in 4 weeks. This time frame is subject to change based on US findings.  She can contact our office with any questions or concerns.   [name], NP 1/12/20221:17 PM

## 2020-07-07 NOTE — Telephone Encounter (Signed)
Returned call to pts son. Olen Cordial advised pt had vomit and diarrhea in car on way home and pt will need to r/s [place] at 345 today. Maura Crandall appt for 1/13 0945 at [facility]. Maura Crandall phone number to radiology to r/s if pt cannot make it to appt. No further concerns.

## 2020-07-08 ENCOUNTER — Telehealth: Payer: Self-pay | Admitting: Family

## 2020-07-08 LAB — IRON AND TIBC
Iron: 45 ug/dL (ref 41–142)
Saturation Ratios: 17 % — ABNORMAL LOW (ref 21–57)
TIBC: 271 ug/dL (ref 236–444)
UIBC: 226 ug/dL (ref 120–384)

## 2020-07-08 LAB — FERRITIN: Ferritin: 76 ng/mL (ref 11–307)

## 2020-07-08 NOTE — Telephone Encounter (Signed)
No los 11/2 

## 2020-07-09 ENCOUNTER — Other Ambulatory Visit: Payer: Self-pay

## 2020-07-09 ENCOUNTER — Ambulatory Visit (HOSPITAL_BASED_OUTPATIENT_CLINIC_OR_DEPARTMENT_OTHER)
Admission: RE | Admit: 2020-07-09 | Discharge: 2020-07-09 | Disposition: A | Discharge status: Home / Self Care | Payer: Managed Care, Other (non HMO) | Source: Ambulatory Visit | Attending: Family | Admitting: Family

## 2020-07-09 ENCOUNTER — Telehealth: Payer: Self-pay | Admitting: *Deleted

## 2020-07-09 DIAGNOSIS — M79605 Pain in left leg: Secondary | ICD-10-CM | POA: Diagnosis present

## 2020-07-09 DIAGNOSIS — M79604 Pain in right leg: Secondary | ICD-10-CM | POA: Diagnosis present

## 2020-07-09 DIAGNOSIS — R1084 Generalized abdominal pain: Secondary | ICD-10-CM | POA: Diagnosis present

## 2020-07-09 DIAGNOSIS — R1900 Intra-abdominal and pelvic swelling, mass and lump, unspecified site: Secondary | ICD-10-CM

## 2020-07-09 DIAGNOSIS — I82431 Acute embolism and thrombosis of right popliteal vein: Secondary | ICD-10-CM | POA: Diagnosis present

## 2020-07-09 DIAGNOSIS — M7989 Other specified soft tissue disorders: Secondary | ICD-10-CM | POA: Diagnosis present

## 2020-07-09 NOTE — Telephone Encounter (Signed)
Notified pt of results. No concerns at this time.

## 2020-07-09 NOTE — Telephone Encounter (Signed)
-----   Message from Eliezer Bottom, NP sent at 07/09/2020 12:01 PM EST ----- No DVT!!!! [facility]!!! Also her Abdominal US is negative. Follow-up with PCP!   ----- Message ----- From: Interface, Rad Results In Sent: 07/09/2020  10:56 AM EST To: Eliezer Bottom, NP

## 2020-07-12 ENCOUNTER — Telehealth: Payer: Self-pay | Admitting: *Deleted

## 2020-07-12 NOTE — Telephone Encounter (Signed)
-----   Message from Eliezer Bottom, NP sent at 07/09/2020 12:01 PM EST ----- No DVT!!!! [facility]!!! Also her Abdominal US is negative. Follow-up with PCP!   ----- Message ----- From: Interface, Rad Results In Sent: 07/09/2020  10:56 AM EST To: Eliezer Bottom, NP

## 2020-07-12 NOTE — Telephone Encounter (Signed)
As noted below by [name], NP, I informed the patient that she doesn't have a DVT. Also, the Abdominal US is negative. Please follow up with your PCP. She verbalized understanding.

## 2020-07-19 ENCOUNTER — Inpatient Hospital Stay: Payer: Managed Care, Other (non HMO)

## 2020-07-20 ENCOUNTER — Other Ambulatory Visit: Payer: Self-pay | Admitting: Family

## 2020-07-20 DIAGNOSIS — K50919 Crohn's disease, unspecified, with unspecified complications: Secondary | ICD-10-CM

## 2020-07-23 ENCOUNTER — Ambulatory Visit: Payer: Managed Care, Other (non HMO)

## 2020-07-23 ENCOUNTER — Other Ambulatory Visit: Payer: Self-pay

## 2020-07-23 ENCOUNTER — Inpatient Hospital Stay: Payer: Managed Care, Other (non HMO)

## 2020-07-23 VITALS — BP 121/67 | HR 83 | Temp 98.4°F | Resp 17

## 2020-07-23 DIAGNOSIS — D500 Iron deficiency anemia secondary to blood loss (chronic): Secondary | ICD-10-CM | POA: Diagnosis not present

## 2020-07-23 DIAGNOSIS — D509 Iron deficiency anemia, unspecified: Secondary | ICD-10-CM

## 2020-07-23 MED ORDER — SODIUM CHLORIDE 0.9 % IV SOLN
Freq: Once | INTRAVENOUS | Status: AC
  Filled 2020-07-23: qty 250

## 2020-07-23 MED ORDER — SODIUM CHLORIDE 0.9 % IV SOLN
200.0000 mg | Freq: Once | INTRAVENOUS | Status: AC
  Administered 2020-07-23: 200 mg via INTRAVENOUS
  Filled 2020-07-23: qty 200

## 2020-07-23 NOTE — Patient Instructions (Signed)

## 2020-07-27 ENCOUNTER — Inpatient Hospital Stay: Payer: Managed Care, Other (non HMO)

## 2020-07-27 ENCOUNTER — Telehealth: Payer: Self-pay

## 2020-07-27 NOTE — Telephone Encounter (Signed)
Pt called in to cancel her iron tx today due to her chrons dx, she stated that she would call back and r/s.  I noticed that she has an appt with [place] on 2/7 and I added it there.      Kari Hahn

## 2020-07-27 NOTE — Telephone Encounter (Signed)
S.w pt and tr/s her 2/7 aptps to 2/14 as sarah will be out of the office   Larnce Schnackenberg

## 2020-08-02 ENCOUNTER — Inpatient Hospital Stay: Payer: Managed Care, Other (non HMO)

## 2020-08-02 ENCOUNTER — Inpatient Hospital Stay: Payer: Managed Care, Other (non HMO) | Admitting: Family

## 2020-08-02 ENCOUNTER — Ambulatory Visit: Payer: Managed Care, Other (non HMO)

## 2020-08-09 ENCOUNTER — Telehealth: Payer: Self-pay | Admitting: Family

## 2020-08-09 ENCOUNTER — Other Ambulatory Visit: Payer: Self-pay

## 2020-08-09 ENCOUNTER — Encounter: Payer: Self-pay | Admitting: Family

## 2020-08-09 ENCOUNTER — Inpatient Hospital Stay: Payer: Managed Care, Other (non HMO) | Attending: Family

## 2020-08-09 ENCOUNTER — Inpatient Hospital Stay: Payer: Managed Care, Other (non HMO)

## 2020-08-09 ENCOUNTER — Inpatient Hospital Stay (HOSPITAL_BASED_OUTPATIENT_CLINIC_OR_DEPARTMENT_OTHER): Payer: Managed Care, Other (non HMO) | Admitting: Family

## 2020-08-09 VITALS — BP 115/52 | HR 90 | Temp 99.0°F | Resp 18 | Ht 62.0 in | Wt 184.8 lb

## 2020-08-09 DIAGNOSIS — Z86718 Personal history of other venous thrombosis and embolism: Secondary | ICD-10-CM | POA: Insufficient documentation

## 2020-08-09 DIAGNOSIS — D509 Iron deficiency anemia, unspecified: Secondary | ICD-10-CM | POA: Diagnosis present

## 2020-08-09 DIAGNOSIS — D510 Vitamin B12 deficiency anemia due to intrinsic factor deficiency: Secondary | ICD-10-CM | POA: Diagnosis not present

## 2020-08-09 DIAGNOSIS — I82431 Acute embolism and thrombosis of right popliteal vein: Secondary | ICD-10-CM

## 2020-08-09 DIAGNOSIS — F17210 Nicotine dependence, cigarettes, uncomplicated: Secondary | ICD-10-CM | POA: Diagnosis not present

## 2020-08-09 DIAGNOSIS — D500 Iron deficiency anemia secondary to blood loss (chronic): Secondary | ICD-10-CM

## 2020-08-09 DIAGNOSIS — Z7901 Long term (current) use of anticoagulants: Secondary | ICD-10-CM | POA: Diagnosis not present

## 2020-08-09 DIAGNOSIS — D6862 Lupus anticoagulant syndrome: Secondary | ICD-10-CM | POA: Insufficient documentation

## 2020-08-09 LAB — CBC WITH DIFFERENTIAL (CANCER CENTER ONLY)
Abs Immature Granulocytes: 0.1 10*3/uL — ABNORMAL HIGH (ref 0.00–0.07)
Basophils Absolute: 0.1 10*3/uL (ref 0.0–0.1)
Basophils Relative: 1 %
Eosinophils Absolute: 0.2 10*3/uL (ref 0.0–0.5)
Eosinophils Relative: 1 %
HCT: 50.3 % — ABNORMAL HIGH (ref 36.0–46.0)
Hemoglobin: 17.8 g/dL — ABNORMAL HIGH (ref 12.0–15.0)
Immature Granulocytes: 1 %
Lymphocytes Relative: 8 %
Lymphs Abs: 1.3 10*3/uL (ref 0.7–4.0)
MCH: 35.2 pg — ABNORMAL HIGH (ref 26.0–34.0)
MCHC: 35.4 g/dL (ref 30.0–36.0)
MCV: 99.6 fL (ref 80.0–100.0)
Monocytes Absolute: 0.8 10*3/uL (ref 0.1–1.0)
Monocytes Relative: 5 %
Neutro Abs: 14.7 10*3/uL — ABNORMAL HIGH (ref 1.7–7.7)
Neutrophils Relative %: 84 %
Platelet Count: 366 10*3/uL (ref 150–400)
RBC: 5.05 MIL/uL (ref 3.87–5.11)
RDW: 14.1 % (ref 11.5–15.5)
WBC Count: 17.2 10*3/uL — ABNORMAL HIGH (ref 4.0–10.5)
nRBC: 0 % (ref 0.0–0.2)

## 2020-08-09 LAB — IRON AND TIBC
Iron: 55 ug/dL (ref 41–142)
Saturation Ratios: 26 % (ref 21–57)
TIBC: 216 ug/dL — ABNORMAL LOW (ref 236–444)
UIBC: 161 ug/dL (ref 120–384)

## 2020-08-09 LAB — FERRITIN: Ferritin: 360 ng/mL — ABNORMAL HIGH (ref 11–307)

## 2020-08-09 LAB — RETICULOCYTES
Immature Retic Fract: 8.7 % (ref 2.3–15.9)
RBC.: 5.02 MIL/uL (ref 3.87–5.11)
Retic Count, Absolute: 79.3 10*3/uL (ref 19.0–186.0)
Retic Ct Pct: 1.6 % (ref 0.4–3.1)

## 2020-08-09 NOTE — Telephone Encounter (Signed)
Appointments scheduled calendar printed & mailed per 2/14 los

## 2020-08-09 NOTE — Progress Notes (Signed)
Hematology and Oncology Follow Up Visit  Kari Hahn [id number] 10/30/1970 50 y.o. 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 She is still having a Crohn's flare. She states that she has a good appetite but every time she eats she has nausea, vomiting and diarrhea. She states that GI informed her it could take 2-6 doses of Stelara before her symptoms start to calm.  She has not noted any obvious blood loss. No bruising or petechiae.  She feels fatigue. She has occasional dizziness with fatigue as well as chills.  She states that she been referred to Vascular for the leg swelling and is waiting to schedule.  She is trying to cut back on her smoking and is now only smoking 1 ppd or less.  She does feel that she is staying well hydrated. Her weight is down 19 lbs to 184 lbs.  She has occasional episode of chest pain which she state are brief (3 minutes or less) and feels this may be due to stress. She states that she has also been referred to surgery for evaluation and treatment of her right inner buttock abscess.  No fever, cough, rash, SOB, chest pain, palpitations, abdominal pain or changes in bowel or bladder habits.  The neuropathy in her hands and feet is stable, unchanged.  No falls or syncope to report.    ECOG Performance Status: 1 - Symptomatic but completely ambulatory  Medications:  Allergies as of 08/09/2020      Reactions   Ibuprofen Nausea Only      Medication List       Accurate as of August 09, 2020 10:18 AM. If you have any questions, ask your nurse or doctor.        STOP taking these medications   ciprofloxacin  500 MG tablet Commonly known as: CIPRO Stopped by: [name], NP   metroNIDAZOLE 500 MG tablet Commonly known as: FLAGYL Stopped by: [name], NP   nitrofurantoin (macrocrystal-monohydrate) 100 MG capsule Commonly known as: MACROBID Stopped by: [name], NP   omeprazole 20 MG capsule Commonly known as: PRILOSEC Stopped by: [name], NP   omeprazole 40 MG capsule Commonly known as: PRILOSEC Stopped by: [name], NP   predniSONE 10 MG tablet Commonly known as: DELTASONE Stopped by: [name], NP     TAKE these medications   amLODipine-valsartan 5-320 MG tablet Commonly known as: EXFORGE 1 TABLET BY MOUTH DAILY FOR BLOOD PRESSURE--- takes in am after eaten   desmopressin 0.2 MG tablet Commonly known as: DDAVP Take 0.4 mg by mouth at bedtime. 09/02/18-BID per pt   Diclofenac Sodium 3 % Gel SMARTSIG:1-2 Gram(s) Topical 3-4 Times Daily   escitalopram 20 MG tablet Commonly known as: LEXAPRO Take 40 mg by mouth at bedtime. 2  20 mg tab = 40 mg daily   ezetimibe 10 MG tablet Commonly known as: ZETIA Take 10 mg by mouth daily.   fluticasone 50 MCG/ACT nasal spray Commonly known as: FLONASE daily as needed.   folic acid 1 MG tablet Commonly known as: FOLVITE Take 1 mg by mouth daily.   furosemide 40 MG tablet Commonly known as: LASIX Take 40 mg by mouth  daily.   gabapentin 800 MG tablet Commonly known as: NEURONTIN Take 800 mg by mouth 3 (three) times daily.   ondansetron 8 MG tablet Commonly known as: ZOFRAN TAKE 1 TABLET BY MOUTH 2 TIMES A DAY   oxyCODONE-acetaminophen 10-325 MG tablet Commonly known as: PERCOCET Take 1 tablet by mouth 4 (four) times daily. Pt takes on a schedule   pantoprazole 40 MG tablet Commonly known as: PROTONIX Take 40 mg by mouth daily.   potassium chloride SA 20 MEQ tablet Commonly known as: KLOR-CON Take 20 mEq by mouth every evening.   ProAir HFA 108 (90 Base) MCG/ACT  inhaler Generic drug: albuterol SMARTSIG:1 Puff(s) Via Inhaler 4 Times Daily PRN   Stelara 90 MG/ML Sosy injection Generic drug: ustekinumab Inject into the skin every 8 (eight) weeks. Last dose 05/27/20.   VITAMIN B 12 PO Take by mouth daily.   vitamin C 1000 MG tablet Take 1,000 mg by mouth daily.   Vitamin D3 125 MCG (5000 UT) Tabs Take by mouth every morning.   Xarelto 10 MG Tabs tablet Generic drug: rivaroxaban TAKE 1 TABLET BY MOUTH EVERY DAY WITH SUPPER   zinc gluconate 50 MG tablet Take 50 mg by mouth daily.       Allergies:  Allergies  Allergen Reactions  . Ibuprofen Nausea Only    Past Medical History, Surgical history, Social history, and Family History were reviewed and updated.  Review of Systems: All other 10 point review of systems is negative.   Physical Exam:  height is 5' 2"  (1.575 m) and weight is 184 lb 12.8 oz (83.8 kg). Her oral temperature is 99 F (37.2 C). Her blood pressure is 115/52 (abnormal) and her pulse is 90. Her respiration is 18 and oxygen saturation is 96%.   Wt Readings from Last 3 Encounters:  08/09/20 184 lb 12.8 oz (83.8 kg)  07/07/20 203 lb 1.9 oz (92.1 kg)  04/07/20 205 lb (93 kg)    Ocular: Sclerae unicteric, pupils equal, round and reactive to light Ear-nose-throat: Oropharynx clear, dentition fair Lymphatic: No cervical or supraclavicular adenopathy Lungs no rales or rhonchi, good excursion bilaterally Heart regular rate and rhythm, no murmur appreciated Abd soft, nontender, positive bowel sounds MSK no focal spinal tenderness, no joint edema Neuro: non-focal, well-oriented, appropriate affect Breasts: Deferred   Lab Results  Component Value Date   WBC 17.2 (H) 08/09/2020   HGB 17.8 (H) 08/09/2020   HCT 50.3 (H) 08/09/2020   MCV 99.6 08/09/2020   PLT 366 08/09/2020   Lab Results  Component Value Date   FERRITIN 76 07/07/2020   IRON 45 07/07/2020   TIBC 271 07/07/2020   UIBC 226 07/07/2020   IRONPCTSAT  17 (L) 07/07/2020   Lab Results  Component Value Date   RETICCTPCT 1.6 08/09/2020   RBC 5.05 08/09/2020   RBC 5.02 08/09/2020   RETICCTABS 88.8 12/21/2014   No results found for: KPAFRELGTCHN, LAMBDASER, KAPLAMBRATIO No results found for: IGGSERUM, IGA, IGMSERUM No results found for: Odetta Pink, SPEI   Chemistry      Component Value Date/Time   NA 140 12/24/2019 0811   NA 141 03/12/2017 0929   K 5.1 12/24/2019 0811   K 2.8 (LL) 03/12/2017 0929   CL 97 (L) 12/24/2019 0811   CL 106 12/08/2011 1145   CO2 27 01/07/2019 0941   CO2 27 03/12/2017 0929   BUN 18 12/24/2019 0811   BUN 9.4 03/12/2017 0929   CREATININE 1.60 (  H) 12/24/2019 0811   CREATININE 1.28 (H) 01/07/2019 0941   CREATININE 1.2 (H) 03/12/2017 0929      Component Value Date/Time   CALCIUM 9.3 01/07/2019 0941   CALCIUM 9.5 03/12/2017 0929   ALKPHOS 92 01/07/2019 0941   ALKPHOS 125 03/12/2017 0929   AST 12 (L) 01/07/2019 0941   AST 13 03/12/2017 0929   ALT 9 01/07/2019 0941   ALT 9 03/12/2017 0929   BILITOT 0.2 (L) 01/07/2019 0941   BILITOT <0.22 03/12/2017 0929       Impression and Plan: Kari Hahn is a very pleasant 50yo caucasian female withiron deficiency anemia due to GI blood loss with Crohn's as well as history of thrombotic disease on anticoagulation.  Iron studies are pending. We will replace if needed.  Follow-up in 1 month.  She was encouraged to contact our office with any questions or concerns.   [name], NP 2/14/202210:18 AM

## 2020-09-06 ENCOUNTER — Inpatient Hospital Stay: Payer: Managed Care, Other (non HMO) | Attending: Family

## 2020-09-06 ENCOUNTER — Inpatient Hospital Stay: Payer: Managed Care, Other (non HMO) | Admitting: Family

## 2020-10-05 ENCOUNTER — Other Ambulatory Visit: Payer: Self-pay | Admitting: Family

## 2020-10-05 DIAGNOSIS — I749 Embolism and thrombosis of unspecified artery: Secondary | ICD-10-CM

## 2020-10-18 ENCOUNTER — Ambulatory Visit: Payer: Self-pay | Admitting: Surgery

## 2020-10-19 ENCOUNTER — Other Ambulatory Visit: Payer: Self-pay

## 2020-10-19 ENCOUNTER — Encounter (HOSPITAL_COMMUNITY): Payer: Self-pay

## 2020-10-19 ENCOUNTER — Encounter (HOSPITAL_COMMUNITY)
Admission: RE | Admit: 2020-10-19 | Discharge: 2020-10-19 | Disposition: A | Discharge status: Home / Self Care | Payer: Managed Care, Other (non HMO) | Source: Ambulatory Visit | Attending: Surgery | Admitting: Surgery

## 2020-10-19 DIAGNOSIS — Z01812 Encounter for preprocedural laboratory examination: Secondary | ICD-10-CM | POA: Diagnosis present

## 2020-10-19 HISTORY — DX: Dyspnea, unspecified: R06.00

## 2020-10-19 LAB — CBC
HCT: 41.1 % (ref 36.0–46.0)
Hemoglobin: 14.8 g/dL (ref 12.0–15.0)
MCH: 35.9 pg — ABNORMAL HIGH (ref 26.0–34.0)
MCHC: 36 g/dL (ref 30.0–36.0)
MCV: 99.8 fL (ref 80.0–100.0)
Platelets: 394 10*3/uL (ref 150–400)
RBC: 4.12 MIL/uL (ref 3.87–5.11)
RDW: 17.6 % — ABNORMAL HIGH (ref 11.5–15.5)
WBC: 14.1 10*3/uL — ABNORMAL HIGH (ref 4.0–10.5)
nRBC: 0 % (ref 0.0–0.2)

## 2020-10-19 LAB — BASIC METABOLIC PANEL
Anion gap: 10 (ref 5–15)
BUN: 18 mg/dL (ref 6–20)
CO2: 26 mmol/L (ref 22–32)
Calcium: 9.5 mg/dL (ref 8.9–10.3)
Chloride: 100 mmol/L (ref 98–111)
Creatinine, Ser: 1.26 mg/dL — ABNORMAL HIGH (ref 0.44–1.00)
GFR, Estimated: 52 mL/min — ABNORMAL LOW (ref >60)
Glucose, Bld: 74 mg/dL (ref 70–99)
Potassium: 3 mmol/L — ABNORMAL LOW (ref 3.5–5.1)
Sodium: 136 mmol/L (ref 135–145)

## 2020-10-19 NOTE — Progress Notes (Addendum)
EKG/epic 12/24/2019  Patient verbalized understanding of instructions that were given to them at the PAT appointment. Patient was also instructed that they will need to review over the PAT instructions again at home before surgery.   No clear instructions per epic in regards to Xarelto. Pt states she has instructions at home as to when to discontinue prior to surgery. She stated it was either 5 to 7 days prior and that she would review and discontinue as instructions noted.   BMP results routed to Dr Nadeen Landau. Chart given to [facility] to review prior to surgery date.

## 2020-10-19 NOTE — Patient Instructions (Addendum)
DUE TO COVID-19 ONLY ONE VISITOR IS ALLOWED TO COME WITH YOU AND STAY IN THE WAITING ROOM ONLY DURING PRE OP AND PROCEDURE DAY OF SURGERY. THE 1 VISITOR  MAY VISIT WITH YOU AFTER SURGERY IN YOUR PRIVATE ROOM DURING VISITING HOURS ONLY!  YOU NEED TO HAVE A COVID 19 TEST ON Monday Oct 25, 2020 @ 0845 am, THIS TEST MUST BE DONE BEFORE SURGERY,  COVID TESTING SITE [place] [id number], IT IS ON THE RIGHT GOING OUT [address] APPROXIMATELY  2 MINUTES PAST [employer] ON THE RIGHT. ONCE YOUR COVID TEST IS COMPLETED,  PLEASE BEGIN THE QUARANTINE INSTRUCTIONS AS OUTLINED IN YOUR HANDOUT.                Kari Hahn  10/19/2020   Your procedure is scheduled on: Oct 27, 2020   Report to [facility] Main  Entrance   Report to admitting at 11:15 am     Call this number if you have problems the morning of surgery [phone]    Remember: Do not eat food after Midnight. May have clear liquids from midnight till 10:15 am day of surgery then nothing by mouth. BRUSH YOUR TEETH MORNING OF SURGERY AND RINSE YOUR MOUTH OUT, NO CHEWING GUM CANDY OR MINTS.  Take these medicines the morning of surgery: prednisone; may use pro-air inhaler if needed; may use eyedrops if needed; oxycodone-acetaminophen if needed; gabapentin   DO NOT TAKE ANY DIABETIC MEDICATIONS DAY OF YOUR SURGERY                               You may not have any metal on your body including hair pins and              piercings  Do not wear jewelry, make-up, lotions, powders or perfumes, deodorant             Do not wear nail polish on your fingernails.  Do not shave  48 hours prior to surgery.                Do not bring valuables to the hospital. Waves.  Contacts, dentures or bridgework may not be worn into surgery.  Leave suitcase in the car. After surgery it may be brought to your room.     Patients discharged the day of surgery will not  be allowed to drive home. IF YOU ARE HAVING SURGERY AND GOING HOME THE SAME DAY, YOU MUST HAVE AN ADULT TO DRIVE YOU HOME AND BE WITH YOU FOR 24 HOURS. YOU MAY GO HOME BY TAXI OR UBER OR ORTHERWISE, BUT AN ADULT MUST ACCOMPANY YOU HOME AND STAY WITH YOU FOR 24 HOURS.  FLEETS ENEMA NIGHT BEFORE AND AM OF SURGERY PER SURGEON'S INSTRUCTION   _____________________________________________________________________                CLEAR LIQUID DIET   Foods Allowed  Foods Excluded  Coffee and tea, regular and decaf                             liquids that you cannot  Plain Jell-O any favor except red or purple                                           see through such as: Fruit ices (not with fruit pulp)                                     milk, soups, orange juice  Iced Popsicles                                    All solid food Carbonated beverages, regular and diet                                    Cranberry, grape and apple juices Sports drinks like Gatorade Lightly seasoned clear broth or consume(fat free) Sugar, honey syrup  Sample Menu Breakfast                                Lunch                                     Supper Cranberry juice                    Beef broth                            Chicken broth Jell-O                                     Grape juice                           Apple juice Coffee or tea                        Jell-O                                      Popsicle                                                Coffee or tea                        Coffee or tea  _____________________________________________________________________  [facility] - Preparing for Surgery Before surgery, you can play an important role.  Because skin is not sterile, your skin  needs to be as free of germs as possible.  You can reduce the number of germs on your skin by washing with CHG (chlorahexidine gluconate)  soap before surgery.  CHG is an antiseptic cleaner which kills germs and bonds with the skin to continue killing germs even after washing. Please DO NOT use if you have an allergy to CHG or antibacterial soaps.  If your skin becomes reddened/irritated stop using the CHG and inform your nurse when you arrive at Short Stay. Do not shave (including legs and underarms) for at least 48 hours prior to the first CHG shower.  You may shave your face/neck. Please follow these instructions carefully:  1.  Shower with CHG Soap the night before surgery and the  morning of Surgery.  2.  If you choose to wash your hair, wash your hair first as usual with your  normal  shampoo.  3.  After you shampoo, rinse your hair and body thoroughly to remove the  shampoo.                           4.  Use CHG as you would any other liquid soap.  You can apply chg directly  to the skin and wash                       Gently with a scrungie or clean washcloth.  5.  Apply the CHG Soap to your body ONLY FROM THE NECK DOWN.   Do not use on face/ open                           Wound or open sores. Avoid contact with eyes, ears mouth and genitals (private parts).                       Wash face,  Genitals (private parts) with your normal soap.             6.  Wash thoroughly, paying special attention to the area where your surgery  will be performed.  7.  Thoroughly rinse your body with warm water from the neck down.  8.  DO NOT shower/wash with your normal soap after using and rinsing off  the CHG Soap.                9.  Pat yourself dry with a clean towel.            10.  Wear clean pajamas.            11.  Place clean sheets on your bed the night of your first shower and do not  sleep with pets. Day of Surgery : Do not apply any lotions/deodorants the morning of surgery.  Please wear clean clothes to the hospital/surgery center.  FAILURE TO FOLLOW THESE INSTRUCTIONS MAY RESULT IN THE CANCELLATION OF YOUR SURGERY PATIENT  SIGNATURE_________________________________  NURSE SIGNATURE__________________________________  ________________________________________________________________________ [facility] - Preparing for Surgery Before surgery, you can play an important role.  Because skin is not sterile, your skin needs to be as free of germs as possible.  You can reduce the number of germs on your skin by washing with CHG (chlorahexidine gluconate) soap before surgery.  CHG is an antiseptic cleaner which kills germs and bonds with the skin to continue killing germs even after washing. Please DO NOT use if you have  an allergy to CHG or antibacterial soaps.  If your skin becomes reddened/irritated stop using the CHG and inform your nurse when you arrive at Short Stay. Do not shave (including legs and underarms) for at least 48 hours prior to the first CHG shower.  You may shave your face/neck. Please follow these instructions carefully:  1.  Shower with CHG Soap the night before surgery and the  morning of Surgery.  2.  If you choose to wash your hair, wash your hair first as usual with your  normal  shampoo.  3.  After you shampoo, rinse your hair and body thoroughly to remove the  shampoo.                           4.  Use CHG as you would any other liquid soap.  You can apply chg directly  to the skin and wash                       Gently with a scrungie or clean washcloth.  5.  Apply the CHG Soap to your body ONLY FROM THE NECK DOWN.   Do not use on face/ open                           Wound or open sores. Avoid contact with eyes, ears mouth and genitals (private parts).                       Wash face,  Genitals (private parts) with your normal soap.             6.  Wash thoroughly, paying special attention to the area where your surgery  will be performed.  7.  Thoroughly rinse your body with warm water from the neck down.  8.  DO NOT shower/wash with your normal soap after using and rinsing off  the CHG Soap.                 9.  Pat yourself dry with a clean towel.            10.  Wear clean pajamas.            11.  Place clean sheets on your bed the night of your first shower and do not  sleep with pets. Day of Surgery : Do not apply any lotions/deodorants the morning of surgery.  Please wear clean clothes to the hospital/surgery center.  FAILURE TO FOLLOW THESE INSTRUCTIONS MAY RESULT IN THE CANCELLATION OF YOUR SURGERY PATIENT SIGNATURE_________________________________  NURSE SIGNATURE__________________________________  ________________________________________________________________________

## 2020-10-25 ENCOUNTER — Other Ambulatory Visit (HOSPITAL_COMMUNITY): Payer: Managed Care, Other (non HMO)

## 2020-10-26 ENCOUNTER — Other Ambulatory Visit (HOSPITAL_COMMUNITY)
Admission: RE | Admit: 2020-10-26 | Discharge: 2020-10-26 | Disposition: A | Discharge status: Home / Self Care | Payer: Managed Care, Other (non HMO) | Source: Ambulatory Visit | Attending: Surgery | Admitting: Surgery

## 2020-10-26 DIAGNOSIS — Z20822 Contact with and (suspected) exposure to covid-19: Secondary | ICD-10-CM | POA: Insufficient documentation

## 2020-10-26 DIAGNOSIS — Z01812 Encounter for preprocedural laboratory examination: Secondary | ICD-10-CM | POA: Insufficient documentation

## 2020-10-26 LAB — SARS CORONAVIRUS 2 (TAT 6-24 HRS): SARS Coronavirus 2: NEGATIVE

## 2020-10-26 MED ORDER — BUPIVACAINE LIPOSOME 1.3 % IJ SUSP
20.0000 mL | Freq: Once | INTRAMUSCULAR | Status: DC
  Filled 2020-10-26: qty 20

## 2020-10-27 ENCOUNTER — Encounter (HOSPITAL_COMMUNITY)
Admission: RE | Disposition: A | Discharge status: Home / Self Care | Payer: Self-pay | Source: Other Acute Inpatient Hospital | Attending: Surgery

## 2020-10-27 ENCOUNTER — Ambulatory Visit (HOSPITAL_COMMUNITY): Payer: Managed Care, Other (non HMO) | Admitting: Certified Registered Nurse Anesthetist

## 2020-10-27 ENCOUNTER — Encounter (HOSPITAL_COMMUNITY): Payer: Self-pay | Admitting: Surgery

## 2020-10-27 ENCOUNTER — Ambulatory Visit (HOSPITAL_COMMUNITY)
Admission: RE | Admit: 2020-10-27 | Discharge: 2020-10-27 | Disposition: A | Discharge status: Home / Self Care | Payer: Managed Care, Other (non HMO) | Source: Other Acute Inpatient Hospital | Attending: Surgery | Admitting: Surgery

## 2020-10-27 DIAGNOSIS — Z7952 Long term (current) use of systemic steroids: Secondary | ICD-10-CM | POA: Insufficient documentation

## 2020-10-27 DIAGNOSIS — Z560 Unemployment, unspecified: Secondary | ICD-10-CM | POA: Diagnosis not present

## 2020-10-27 DIAGNOSIS — K5090 Crohn's disease, unspecified, without complications: Secondary | ICD-10-CM | POA: Insufficient documentation

## 2020-10-27 DIAGNOSIS — Z79891 Long term (current) use of opiate analgesic: Secondary | ICD-10-CM | POA: Diagnosis not present

## 2020-10-27 DIAGNOSIS — K624 Stenosis of anus and rectum: Secondary | ICD-10-CM | POA: Diagnosis not present

## 2020-10-27 DIAGNOSIS — Z9049 Acquired absence of other specified parts of digestive tract: Secondary | ICD-10-CM | POA: Insufficient documentation

## 2020-10-27 DIAGNOSIS — Z886 Allergy status to analgesic agent status: Secondary | ICD-10-CM | POA: Insufficient documentation

## 2020-10-27 DIAGNOSIS — L0231 Cutaneous abscess of buttock: Secondary | ICD-10-CM | POA: Insufficient documentation

## 2020-10-27 DIAGNOSIS — Z79899 Other long term (current) drug therapy: Secondary | ICD-10-CM | POA: Diagnosis not present

## 2020-10-27 LAB — GLUCOSE, CAPILLARY: Glucose-Capillary: 87 mg/dL (ref 70–99)

## 2020-10-27 SURGERY — EXAM UNDER ANESTHESIA
Anesthesia: General

## 2020-10-27 MED ORDER — ACETAMINOPHEN 500 MG PO TABS
1000.0000 mg | ORAL_TABLET | ORAL | Status: AC
  Administered 2020-10-27: 1000 mg via ORAL
  Filled 2020-10-27: qty 2

## 2020-10-27 MED ORDER — LABETALOL HCL 5 MG/ML IV SOLN
INTRAVENOUS | Status: AC
  Filled 2020-10-27: qty 4

## 2020-10-27 MED ORDER — MIDAZOLAM HCL 5 MG/5ML IJ SOLN
INTRAMUSCULAR | Status: DC | PRN
  Administered 2020-10-27: 2 mg via INTRAVENOUS

## 2020-10-27 MED ORDER — SODIUM CHLORIDE 0.9 % IR SOLN
Status: DC | PRN
  Administered 2020-10-27: 1000 mL

## 2020-10-27 MED ORDER — LIDOCAINE 2% (20 MG/ML) 5 ML SYRINGE
INTRAMUSCULAR | Status: DC | PRN
  Administered 2020-10-27: 60 mg via INTRAVENOUS

## 2020-10-27 MED ORDER — CHLORHEXIDINE GLUCONATE CLOTH 2 % EX PADS: 6.0000 | MEDICATED_PAD | Freq: Once | CUTANEOUS | Status: DC

## 2020-10-27 MED ORDER — PROPOFOL 10 MG/ML IV BOLUS
INTRAVENOUS | Status: DC | PRN
  Administered 2020-10-27: 130 mg via INTRAVENOUS

## 2020-10-27 MED ORDER — LIDOCAINE 2% (20 MG/ML) 5 ML SYRINGE
INTRAMUSCULAR | Status: AC
  Filled 2020-10-27: qty 5

## 2020-10-27 MED ORDER — KETAMINE HCL 10 MG/ML IJ SOLN
INTRAMUSCULAR | Status: DC | PRN
  Administered 2020-10-27: 50 mg via INTRAVENOUS

## 2020-10-27 MED ORDER — PROPOFOL 10 MG/ML IV BOLUS
INTRAVENOUS | Status: AC
  Filled 2020-10-27: qty 20

## 2020-10-27 MED ORDER — SUGAMMADEX SODIUM 200 MG/2ML IV SOLN
INTRAVENOUS | Status: DC | PRN
  Administered 2020-10-27: 310 mg via INTRAVENOUS

## 2020-10-27 MED ORDER — BUPIVACAINE-EPINEPHRINE (PF) 0.25% -1:200000 IJ SOLN
INTRAMUSCULAR | Status: AC
  Filled 2020-10-27: qty 30

## 2020-10-27 MED ORDER — DEXAMETHASONE SODIUM PHOSPHATE 10 MG/ML IJ SOLN
INTRAMUSCULAR | Status: AC
  Filled 2020-10-27: qty 1

## 2020-10-27 MED ORDER — ROCURONIUM BROMIDE 10 MG/ML (PF) SYRINGE
PREFILLED_SYRINGE | INTRAVENOUS | Status: AC
  Filled 2020-10-27: qty 10

## 2020-10-27 MED ORDER — LACTATED RINGERS IV SOLN: INTRAVENOUS | Status: DC

## 2020-10-27 MED ORDER — FENTANYL CITRATE (PF) 250 MCG/5ML IJ SOLN
INTRAMUSCULAR | Status: DC | PRN
  Administered 2020-10-27 (×2): 50 ug via INTRAVENOUS

## 2020-10-27 MED ORDER — BUPIVACAINE-EPINEPHRINE (PF) 0.25% -1:200000 IJ SOLN
INTRAMUSCULAR | Status: DC | PRN
  Administered 2020-10-27: 30 mL

## 2020-10-27 MED ORDER — ORAL CARE MOUTH RINSE: 15.0000 mL | Freq: Once | OROMUCOSAL | Status: AC

## 2020-10-27 MED ORDER — BUPIVACAINE LIPOSOME 1.3 % IJ SUSP
INTRAMUSCULAR | Status: DC | PRN
  Administered 2020-10-27: 20 mL

## 2020-10-27 MED ORDER — FENTANYL CITRATE (PF) 100 MCG/2ML IJ SOLN: 25.0000 ug | INTRAMUSCULAR | Status: DC | PRN

## 2020-10-27 MED ORDER — ONDANSETRON HCL 4 MG/2ML IJ SOLN
INTRAMUSCULAR | Status: AC
  Filled 2020-10-27: qty 2

## 2020-10-27 MED ORDER — DEXAMETHASONE SODIUM PHOSPHATE 10 MG/ML IJ SOLN
INTRAMUSCULAR | Status: DC | PRN
  Administered 2020-10-27: 8 mg via INTRAVENOUS

## 2020-10-27 MED ORDER — AMLODIPINE BESYLATE 5 MG PO TABS
5.0000 mg | ORAL_TABLET | Freq: Once | ORAL | Status: AC
  Administered 2020-10-27: 5 mg via ORAL
  Filled 2020-10-27: qty 1

## 2020-10-27 MED ORDER — MIDAZOLAM HCL 2 MG/2ML IJ SOLN
INTRAMUSCULAR | Status: AC
  Filled 2020-10-27: qty 2

## 2020-10-27 MED ORDER — ROCURONIUM BROMIDE 10 MG/ML (PF) SYRINGE
PREFILLED_SYRINGE | INTRAVENOUS | Status: DC | PRN
  Administered 2020-10-27: 80 mg via INTRAVENOUS

## 2020-10-27 MED ORDER — KETAMINE HCL 10 MG/ML IJ SOLN
INTRAMUSCULAR | Status: AC
  Filled 2020-10-27: qty 1

## 2020-10-27 MED ORDER — ONDANSETRON HCL 4 MG/2ML IJ SOLN
INTRAMUSCULAR | Status: DC | PRN
  Administered 2020-10-27: 4 mg via INTRAVENOUS

## 2020-10-27 MED ORDER — FENTANYL CITRATE (PF) 100 MCG/2ML IJ SOLN
INTRAMUSCULAR | Status: AC
  Filled 2020-10-27: qty 2

## 2020-10-27 MED ORDER — CHLORHEXIDINE GLUCONATE 0.12 % MT SOLN
15.0000 mL | Freq: Once | OROMUCOSAL | Status: AC
  Administered 2020-10-27: 15 mL via OROMUCOSAL

## 2020-10-27 SURGICAL SUPPLY — 27 items
BRIEF STRETCH FOR OB PAD LRG (UNDERPADS AND DIAPERS) ×2 IMPLANT
COVER WAND RF STERILE (DRAPES) IMPLANT
DECANTER SPIKE VIAL GLASS SM (MISCELLANEOUS) IMPLANT
DRSG PAD ABDOMINAL 8X10 ST (GAUZE/BANDAGES/DRESSINGS) ×2 IMPLANT
ELECT REM PT RETURN 15FT ADLT (MISCELLANEOUS) ×2 IMPLANT
GAUZE SPONGE 4X4 12PLY STRL (GAUZE/BANDAGES/DRESSINGS) ×2 IMPLANT
GLOVE SURG ENC MOIS LTX SZ7.5 (GLOVE) ×2 IMPLANT
GLOVE SURG UNDER LTX SZ8 (GLOVE) ×2 IMPLANT
GOWN STRL REUS W/TWL XL LVL3 (GOWN DISPOSABLE) ×4 IMPLANT
KIT BASIN OR (CUSTOM PROCEDURE TRAY) ×2 IMPLANT
KIT TURNOVER KIT A (KITS) ×2 IMPLANT
NEEDLE HYPO 22GX1.5 SAFETY (NEEDLE) ×2 IMPLANT
PACK GENERAL/GYN (CUSTOM PROCEDURE TRAY) ×2 IMPLANT
PENCIL SMOKE EVACUATOR (MISCELLANEOUS) ×2 IMPLANT
SHEARS HARMONIC 9CM CVD (BLADE) IMPLANT
SPONGE SURGIFOAM ABS GEL 100 (HEMOSTASIS) IMPLANT
SURGILUBE 2OZ TUBE FLIPTOP (MISCELLANEOUS) ×2 IMPLANT
SUT CHROMIC 2 0 SH (SUTURE) IMPLANT
SUT CHROMIC 3 0 SH 27 (SUTURE) IMPLANT
SUT MNCRL AB 4-0 PS2 18 (SUTURE) ×2 IMPLANT
SUT SILK 2 0 (SUTURE) ×2
SUT SILK 2-0 18XBRD TIE 12 (SUTURE) ×1 IMPLANT
SUT VIC AB 3-0 SH 27 (SUTURE) ×2
SUT VIC AB 3-0 SH 27X BRD (SUTURE) ×1 IMPLANT
SYR 20ML LL LF (SYRINGE) ×2 IMPLANT
TOWEL OR 17X26 10 PK STRL BLUE (TOWEL DISPOSABLE) ×2 IMPLANT
TOWEL OR NON WOVEN STRL DISP B (DISPOSABLE) ×2 IMPLANT

## 2020-10-27 NOTE — Anesthesia Procedure Notes (Signed)
Procedure Name: Intubation Performed by: Rosaland Lao, CRNA Pre-anesthesia Checklist: Patient identified, Emergency Drugs available, Suction available and Patient being monitored Patient Re-evaluated:Patient Re-evaluated prior to induction Oxygen Delivery Method: Circle system utilized Preoxygenation: Pre-oxygenation with 100% oxygen Induction Type: IV induction Ventilation: Mask ventilation without difficulty and Oral airway inserted - appropriate to patient size Laryngoscope Size: Sabra Heck and 2 Grade View: Grade I Tube type: Oral Tube size: 7.0 mm Number of attempts: 1 Airway Equipment and Method: Stylet and Oral airway Placement Confirmation: ETT inserted through vocal cords under direct vision,  positive ETCO2 and breath sounds checked- equal and bilateral Secured at: 22 cm Tube secured with: Tape Dental Injury: Teeth and Oropharynx as per pre-operative assessment

## 2020-10-27 NOTE — Transfer of Care (Signed)
Immediate Anesthesia Transfer of Care Note  Patient: Kari Hahn  Procedure(s) Performed: ANORECTAL EXAM UNDER ANESTHESIA (N/A ) POSSIBLE INCISION AND DRAINAGE ABSCESS (N/A )  Patient Location: PACU  Anesthesia Type:General  Level of Consciousness: drowsy and patient cooperative  Airway & Oxygen Therapy: Patient Spontanous Breathing and Patient connected to face mask oxygen  Post-op Assessment: Report given to RN and Post -op Vital signs reviewed and stable  Post vital signs: Reviewed and stable  Last Vitals:  Vitals Value Taken Time  BP 168/97 10/27/20 1730  Temp    Pulse 105 10/27/20 1732  Resp 16 10/27/20 1732  SpO2 97 % 10/27/20 1732  Vitals shown include unvalidated device data.  Last Pain:  Vitals:   10/27/20 1340  TempSrc:   PainSc: 7       Patients Stated Pain Goal: 4 (84/06/98 6148)  Complications: No complications documented.

## 2020-10-27 NOTE — Anesthesia Postprocedure Evaluation (Signed)
Anesthesia Post Note  Patient: Kari Hahn  Procedure(s) Performed: RIGHT BUTTOCK I&D / INNER RECTAL EXAM UNDER ANESTHESIA (N/A )     Patient location during evaluation: PACU Anesthesia Type: General Level of consciousness: awake and alert Pain management: pain level controlled Vital Signs Assessment: post-procedure vital signs reviewed and stable Respiratory status: spontaneous breathing, nonlabored ventilation, respiratory function stable and patient connected to nasal cannula oxygen Cardiovascular status: blood pressure returned to baseline and stable Postop Assessment: no apparent nausea or vomiting Anesthetic complications: no   No complications documented.  Last Vitals:  Vitals:   10/27/20 1815 10/27/20 1827  BP: (!) 141/89 133/86  Pulse: 98 94  Resp: 17 16  Temp: 36.7 C 36.7 C  SpO2: 91% 91%    Last Pain:  Vitals:   10/27/20 1827  TempSrc:   PainSc: 0-No pain                 Jimmey Hengel L Jolena Kittle

## 2020-10-27 NOTE — Discharge Instructions (Addendum)
ANORECTAL SURGERY: POST OP INSTRUCTIONS  1. DIET: Follow a light bland diet the first 24 hours after arrival home, such as soup, liquids, crackers, etc.  Be sure to include lots of fluids daily.  Avoid fast food or heavy meals as your are more likely to get nauseated.  Eat a low fat diet the next few days after surgery.   2. Some bleeding with bowel movements is expected for the first couple of days but this should stop in between bowel movements  3. Take your usually prescribed home medications unless otherwise directed.  4. PAIN CONTROL: a. It is helpful to take an over-the-counter pain medication regularly for the first few days/weeks.  Choose from the following that works best for you: i. Ibuprofen (Advil, etc) Three 280m tabs every 6 hours as needed. ii. Acetaminophen (Tylenol, etc) 500-6587mevery 6 hours as needed iii. NOTE: You may take both of these medications together - most patients find it most helpful when alternating between the two (i.e. Ibuprofen at 6am, tylenol at 9am, ibuprofen at 12pm ...) b. You are currently being prescribed narcotic medications; from the procedure today, you should not need narcotic level analgesics but if you are currently taking for another condition, you may continue  5. Avoid getting constipated.  Between the surgery and the pain medications, it is common to experience some constipation.  Increasing fluid intake (64oz of water per day) and taking a fiber supplement (such as Metamucil, Citrucel, FiberCon) 1-2 times a day regularly will usually help prevent this problem from occurring.  Take Miralax (over the counter) 1-2x/day while taking a narcotic pain medication. If no bowel movement after 48hours, you may additionally take a laxative like a bottle of Milk of Magnesia which can be purchased over the counter. Avoid enemas if possible as these are often painful.   6. Watch out for diarrhea.  If you have many loose bowel movements, simplify your diet to  bland foods.  Stop any stool softeners and decrease your fiber supplement. If this worsens or does not improve, please call [place] 7. Wash / shower every day.  If you were discharged with a dressing, you may remove this the day after your surgery. You may shower normally, getting soap/water on your wound, particularly after bowel movements.  8. Soaking in a warm bath filled a couple inches ("Sitz bath") is a great way to clean the area after a bowel movement and many patients find it is a way to soothe the area.  9. ACTIVITIES as tolerated:   a. You may resume regular (light) daily activities beginning the next day--such as daily self-care, walking, climbing stairs--gradually increasing activities as tolerated.  If you can walk 30 minutes without difficulty, it is safe to try more intense activity such as jogging, treadmill, bicycling, low-impact aerobics, etc. b. Refrain from any heavy lifting or straining for the first 2 weeks after your procedure, particularly if your surgery was for hemorrhoids. c. Avoid activities that make your pain worse d. You may drive when you are no longer taking prescription pain medication, you can comfortably wear a seatbelt, and you can safely maneuver your car and apply brakes.  10. FOLLOW UP in our office a. Please call [facility] at [phone] to set up an appointment to see your surgeon in the office for a follow-up appointment approximately 2 weeks after your surgery. b. Make sure that you call for this appointment the day you arrive home to insure a convenient appointment time.  9. If you have disability or family leave forms that need to be completed, you may have them completed by your primary care physician's office; for return to work instructions, please ask our office staff and they will be happy to assist you in obtaining this documentation   When to call us [phone]: 1. Poor pain control 2. Reactions / problems with new medications (rash/itching,  etc)  3. Fever over 101.5 F (38.5 C) 4. Inability to urinate 5. Nausea/vomiting 6. Worsening swelling or bruising 7. Continued bleeding from incision. 8. Increased pain, redness, or drainage from the incision  The clinic staff is available to answer your questions during regular business hours (8:30am-5pm).  Please don't hesitate to call and ask to speak to one of our nurses for clinical concerns.   A surgeon from [facility] is always on call at the hospitals   If you have a medical emergency, go to the nearest emergency room or call 911.   [facility], [place], [place], [place], [place]  [address] ? MAIN: [phone] FAX [phone] [email or web address]   General Anesthesia, Adult, Care After This sheet gives you information about how to care for yourself after your procedure. Your health care provider may also give you more specific instructions. If you have problems or questions, contact your health care provider. What can I expect after the procedure? After the procedure, the following side effects are common:  Pain or discomfort at the IV site.  Nausea.  Vomiting.  Sore throat.  Trouble concentrating.  Feeling cold or chills.  Feeling weak or tired.  Sleepiness and fatigue.  Soreness and body aches. These side effects can affect parts of the body that were not involved in surgery. Follow these instructions at home: For the time period you were told by your health care provider:  Rest.  Do not participate in activities where you could fall or become injured.  Do not drive or use machinery.  Do not drink alcohol.  Do not take sleeping pills or medicines that cause drowsiness.  Do not make important decisions or sign legal documents.  Do not take care of children on your own.   Eating and drinking  Follow any instructions from your health care provider about eating or drinking restrictions.  When  you feel hungry, start by eating small amounts of foods that are soft and easy to digest (bland), such as toast. Gradually return to your regular diet.  Drink enough fluid to keep your urine pale yellow.  If you vomit, rehydrate by drinking water, juice, or clear broth. General instructions  If you have sleep apnea, surgery and certain medicines can increase your risk for breathing problems. Follow instructions from your health care provider about wearing your sleep device: ? Anytime you are sleeping, including during daytime naps. ? While taking prescription pain medicines, sleeping medicines, or medicines that make you drowsy.  Have a responsible adult stay with you for the time you are told. It is important to have someone help care for you until you are awake and alert.  Return to your normal activities as told by your health care provider. Ask your health care provider what activities are safe for you.  Take over-the-counter and prescription medicines only as told by your health care provider.  If you smoke, do not smoke without supervision.  Keep all follow-up visits as told by your health care provider. This is important. Contact a health care provider if:  You have nausea or vomiting that does not get better with medicine.  You cannot eat or drink without vomiting.  You have pain that does not get better with medicine.  You are unable to pass urine.  You develop a skin rash.  You have a fever.  You have redness around your IV site that gets worse. Get help right away if:  You have difficulty breathing.  You have chest pain.  You have blood in your urine or stool, or you vomit blood. Summary  After the procedure, it is common to have a sore throat or nausea. It is also common to feel tired.  Have a responsible adult stay with you for the time you are told. It is important to have someone help care for you until you are awake and alert.  When you feel hungry,  start by eating small amounts of foods that are soft and easy to digest (bland), such as toast. Gradually return to your regular diet.  Drink enough fluid to keep your urine pale yellow.  Return to your normal activities as told by your health care provider. Ask your health care provider what activities are safe for you. This information is not intended to replace advice given to you by your health care provider. Make sure you discuss any questions you have with your health care provider. Document Revised: 02/26/2020 Document Reviewed: 09/25/2019 Elsevier Patient Education  2021 [employer].

## 2020-10-27 NOTE — Op Note (Signed)
10/27/2020  5:19 PM  PATIENT:  Kari Hahn  50 y.o. female  Patient Care Team: [facility] [place] as PCP - General  PRE-OPERATIVE DIAGNOSIS:  1. History of Crohn's disease 2. History of perianal abscess/fistula  POST-OPERATIVE DIAGNOSIS:  Same  PROCEDURE:   1. Incision/drainage of right gluteal abscess 2. Anorectal exam under anestheia  SURGEON:  Surgeon(s): Bobby Barton, Sharon Mt, MD Carlena Hurl, PA-C  ASSISTANT: Carlena Hurl, PA-C   ANESTHESIA:   local and general  SPECIMEN:  None  DISPOSITION OF SPECIMEN:  N/A  COUNTS:  Sponge, needle, and instrument counts were reported correct x2 at conclusion.  EBL: 2 mL  Drains: None  PLAN OF CARE: Discharge to home after PACU  PATIENT DISPOSITION:  PACU - hemodynamically stable.  OR FINDINGS: Mild anal stenosis but able to accommodate a lubricated digit as well as the small anoscope. Large waxy "elephant ear" skin tags, 3 columns.  Anal canal with scar tissue but no evident mass or ulceration.  These constellation of findings are suspicious for perianal Crohn's disease.  She also has shallow anal fissures.  Right gluteal area with multiple deep dermal pits but no significant granulation tissue.  At the location at the base of 1 of these tags where there was a suspected fistula previously, I carefully probed this multiple times but was unable to find any communication with the anal canal.  There was a small amount of purulent fluid that exuded from one of the dermal pits.  An incision/drainage was carried out this location as there is also some indurated tissue.  She does have an old appearing abscess cavity but no apparent communication with the anal canal or large amount of pus within this.  DESCRIPTION: The patient was identified in the preoperative holding area and taken to the OR. SCDs were applied. She then underwent general endotracheal anesthesia without difficulty. The patient was then rolled onto the OR table in the  prone jackknife position. Pressure points were then evaluated and padded. Benzoin was applied to the buttocks and they were gently taped apart.  She was then prepped and draped in usual sterile fashion.  A surgical timeout was performed indicating the correct patient, procedure, and positioning.  A perianal block was then created using a dilute mixture of 0.25% Marcaine with epinephrine and Exparel.  After ascertaining an appropriate level of anesthesia had been achieved, a well lubricated digital rectal exam was performed. This demonstrated mild intrinsic anal stenosis but a well lubricated digit easily passes.  She has a large waxy "elephant ear" skin tags, 3 columns.  Multiple dermal pits on the right side at the base of 1 of these tags.  No erythema.  A Hill-Ferguson anoscope was into the anal canal and circumferential inspection demonstrated chronic appearing scarring within the anal canal but no evident ulceration or mass.  The area seen in the office that had previously been draining for her was carefully probed multiple times taking care not to create any false passages.  There is no evident communication with any other dermal pit around her anus or with the anal canal.  During instrumentation, there was a small amount of turbid purulent-like fluid that drained from one of the pits.  This was less than 1 cc.  This pit was incised and explored.  There is a small old appearing abscess cavity.  This is also probed with the fistula probe but no communication can be proven with the anal canal.  Additional local anesthetic is infiltrated.  Hemostasis is verified.  Sponge, needle, and instrument counts were reported correct x2.  The buttocks are untaped.  A dressing consisting of 4 x 4's, ABD, and mesh underwear was placed.  She was then rolled back onto a stretcher, awakened from anesthesia, extubated, and transported PACU in satisfactory condition.  DISPOSITION: PACU in satisfactory condition.

## 2020-10-27 NOTE — Anesthesia Preprocedure Evaluation (Addendum)
Anesthesia Evaluation  Patient identified by MRN, date of birth, ID band Patient awake    Reviewed: Allergy & Precautions, NPO status , Patient's Chart, lab work & pertinent test results  Airway Mallampati: IV  TM Distance: >3 FB Neck ROM: Full  Mouth opening: Limited Mouth Opening  Dental  (+) Edentulous Upper, Edentulous Lower, Dental Advisory Given   Pulmonary shortness of breath, Current SmokerPatient did not abstain from smoking.,    Pulmonary exam normal breath sounds clear to auscultation       Cardiovascular hypertension, Pt. on medications + DVT  Normal cardiovascular exam Rhythm:Regular Rate:Normal     Neuro/Psych PSYCHIATRIC DISORDERS Anxiety Depression negative neurological ROS     GI/Hepatic Neg liver ROS, GERD  Medicated and Controlled,Crohn's   Endo/Other  negative endocrine ROS  Renal/GU Renal InsufficiencyRenal disease (Cr 1.26, K 3.0)  negative genitourinary   Musculoskeletal  (+) Arthritis , narcotic dependent  Abdominal   Peds  Hematology  (+) Blood dyscrasia (on xarelto), ,   Anesthesia Other Findings Takes prednisone 8m qd  Reproductive/Obstetrics                            Anesthesia Physical Anesthesia Plan  ASA: III  Anesthesia Plan: General   Post-op Pain Management:    Induction: Intravenous  PONV Risk Score and Plan: 2 and Midazolam, Dexamethasone and Ondansetron  Airway Management Planned: Oral ETT  Additional Equipment:   Intra-op Plan:   Post-operative Plan: Extubation in OR  Informed Consent: I have reviewed the patients History and Physical, chart, labs and discussed the procedure including the risks, benefits and alternatives for the proposed anesthesia with the patient or authorized representative who has indicated his/her understanding and acceptance.     Dental advisory given  Plan Discussed with: CRNA  Anesthesia Plan Comments:          Anesthesia Quick Evaluation

## 2020-10-27 NOTE — H&P (Signed)
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 She reports she was first diagnosed with Crohn's disease 15+ years ago. She has previously been on Humira. She was started on Stelara 01/2020 and received 1 infusion. She is post and another infusion she states 8 weeks later but there were some insurance issues. She did not receive another infusion until December, 2021. Since that time she's had ongoing issues with drainage from the perianal area that she says is purulent in color. She denies fever or chills. She denies any significant worsening of her symptoms over the last 2 weeks. She smokes between 13 packs per day and states she is working on quitting. She was ordered to have a pelvic MRI completed which is not done.  INTERVAL HX She denies any changes in her health since we met in the office aside from being restarted on prednisone. Off Xarelto x4 days now.   PMH: HTN, HLD, OSA, PE (on Xarelto), Crohn's disease  PSH: Laparoscopic Nissen fundoplication; laparoscopic cholecystectomy  FHx: Denies FHx of colorectal, breast, endometrial, ovarian or cervical cancer  Social: Heavy tobacco use; denies use of alcohol or drugs. She reports she is currently unemployed. 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  ROS: A comprehensive 10 system review of systems was completed with the patient and pertinent findings as noted above.   Past Surgical History Cesarean Section - Multiple  Gallbladder Surgery - Laparoscopic  Knee Surgery  Left. Nissen Fundoplication   Diagnostic Studies History Colonoscopy  within last year Mammogram  1-3 years ago Pap Smear  1-5 years ago  Allergies  Ibuprofen *ANALGESICS -  ANTI-INFLAMMATORY*   Medication History (Armen Ferguson, CMA; 07/16/2020 10:45 AM) amLODIPine Besylate-Valsartan (5-320MG Tablet, Oral) Active. oxyCODONE-Acetaminophen (10-325MG Tablet, Oral) Active. Desmopressin Acetate (0.2MG Tablet, Oral) Active. Escitalopram Oxalate (20MG Tablet, Oral) Active. Ezetimibe (10MG Tablet, Oral) Active. Fluticasone Propionate (50MCG/ACT Suspension, Nasal) Active. Furosemide (40MG Tablet, Oral) Active. Nitrofurantoin Monohyd Macro (100MG Capsule, Oral) Active. Gabapentin (600MG Tablet, Oral) Active. Xarelto (10MG Tablet, Oral) Active. Stelara (90MG/ML Soln Pref Syr, Subcutaneous) Active. predniSONE (5MG Tablet, Oral) Active. Potassium Chloride Crys ER (20MEQ Tablet ER, Oral) Active. Medications Reconciled  Social History Alcohol use  Remotely quit alcohol use. Caffeine use  Carbonated beverages, Coffee, Tea. No drug use  Tobacco use  Current every day smoker.  Family History  Cerebrovascular Accident  Father. Hypertension  Mother. Ischemic Bowel Disease  Sister. Migraine Headache  Son.  Pregnancy / Birth History Age at menarche  38 years. Age of menopause  [age] Gravida  4 Length (months) of breastfeeding  7-12 Maternal age  90-20 Para  2  Other Problems  Anxiety Disorder  Back Pain  Bladder Problems  Cholelithiasis  Crohn's Disease  Depression  Gastroesophageal Reflux Disease  Hemorrhoids  High blood pressure  Kidney Stone  Pulmonary Embolism / Blood Clot in Legs     Review of Systems (Armen Ferguson CMA; 07/16/2020 10:41 AM) General Present- Fatigue. Not Present- Appetite Loss, Chills, Fever, Night Sweats, Weight Gain and Weight Loss. Skin Present- Dryness. Not Present- Change in Wart/Mole, Hives, Jaundice, New Lesions, Non-Healing Wounds, Rash and Ulcer. HEENT Present- Seasonal Allergies and Wears glasses/contact lenses. Not Present- Earache, Hearing Loss, Hoarseness, Nose Bleed, Oral  Ulcers, Ringing in the Ears, Sinus Pain, Sore Throat, Visual Disturbances and Yellow Eyes. Respiratory Not Present- Bloody sputum, Chronic Cough, Difficulty Breathing, Snoring and Wheezing. Breast Not Present- Breast Mass, Breast Pain, Nipple Discharge and Skin Changes. Cardiovascular Present- Leg Cramps and Swelling of Extremities. Not Present- Chest Pain, Difficulty Breathing Lying Down, Palpitations, Rapid Heart Rate and Shortness of Breath. Gastrointestinal Present- Abdominal Pain, Chronic diarrhea and Nausea. Not Present- Bloating, Bloody Stool, Change in Bowel Habits, Constipation, Difficulty Swallowing, Excessive gas, Gets full quickly at meals, Hemorrhoids, Indigestion, Rectal Pain and Vomiting. Female Genitourinary Present- Frequency and Urgency. Not Present- Nocturia, Painful Urination and Pelvic Pain. Musculoskeletal Present- Back Pain, Muscle Pain and Swelling of Extremities. Not Present- Joint Pain, Joint Stiffness and Muscle Weakness. Neurological Present- Numbness, Tingling and Trouble walking. Not Present- Decreased Memory, Fainting, Headaches, Seizures, Tremor and Weakness. Psychiatric Present- Anxiety and Depression. Not Present- Bipolar, Change in Sleep Pattern, Fearful and Frequent crying. Endocrine Present- Hot flashes. Not Present- Cold Intolerance, Excessive Hunger, Hair Changes, Heat Intolerance and New Diabetes. Hematology Present- Blood Thinners and Easy Bruising. Not Present- Excessive bleeding, Gland problems, HIV and Persistent Infections.  Vitals:   10/27/20 1324 10/27/20 1344  BP: (!) 154/94   Pulse: 92   Resp: 16   Temp: 98.8 F (37.1 C)   TempSrc: Oral   SpO2: 93%   Weight:  77.5 kg  Height:  5' 2"  (1.575 m)       Physical Exam (Historical from office visit) The physical exam findings are as follows: Note: Constitutional: No acute distress; conversant; no deformities; wearing mask Eyes: Moist conjunctiva; no lid lag; anicteric sclerae; pupils  equal/round Neck: Trachea midline; no palpable thyromegaly Lungs: Normal respiratory effort; no tactile fremitus CV: rrr; no palpable thrill GI: Abdomen obese, soft, nontender, nondistended; no palpable hepatosplenomegaly Anorectal: 3 column waxy edematous elephant ear tags. 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 No fluctuance or undrained collection evident. No erythema. DRE - mild anal canal stenosis but DRE is possible; no clearly palpable masses Anoscopy: Anoscopy limited intolerance but demonstrates some granulation tissue in the anal canal. No gross evidence of active fissures. 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 The pathophysiology of anal abscess/fistula was discussed with associated pictures and illustrations. -We discussed options going forward. Given multiple perianal findings, would not remove the elephant tags. She also has some degree of anal stenosis which can be worsened with excision of these. 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 Possible incision and drainage of any perianal collections of present. She'll need to come off her Xarelto perioperatively. We will request clearance for this from her prescribing provider. -I strongly recommended she quit smoking and we discussed Crohn's disease having a much more severe phenotype in patients whom are actively smoking. -The planned procedure, material risks (including, but not limited to, pain, bleeding, infection, scarring, need for blood transfusion, damage to anal sphincter,  incontinence of gas and/or stool, need for additional procedures, recurrence, pneumonia, heart attack, stroke, death) benefits and alternatives to surgery were discussed at length. I noted a good probability that the procedure  would help improve their symptoms. The patient's questions were answered to her satisfaction, she voiced understanding and elected to proceed with surgery. Additionally, we discussed typical postoperative expectations and the recovery process.

## 2020-10-28 ENCOUNTER — Telehealth: Payer: Self-pay

## 2020-10-28 ENCOUNTER — Telehealth: Payer: Self-pay | Admitting: *Deleted

## 2020-10-28 NOTE — Telephone Encounter (Signed)
Call received from patient wanting to know when she can start Xarelto back and after having an I&D of right buttocks yesterday. Dr. Marin Olp notified. Pt notified per order of Dr. Marin Olp that she can start the Xarelto back tomorrow, 10/29/20.  Teach back done.  Pt appreciative of call and has no further questions or concerns at this time.

## 2020-10-28 NOTE — Telephone Encounter (Signed)
Called pt per sch message and she is aware of her appts, pt req to wait until first of June and she want the very first appt- aware of her appt 12/07/20  Huy Majid

## 2020-12-07 ENCOUNTER — Inpatient Hospital Stay: Payer: Managed Care, Other (non HMO) | Admitting: Family

## 2020-12-07 ENCOUNTER — Telehealth: Payer: Self-pay

## 2020-12-07 ENCOUNTER — Inpatient Hospital Stay: Payer: Managed Care, Other (non HMO) | Attending: Family

## 2020-12-07 ENCOUNTER — Other Ambulatory Visit: Payer: Self-pay

## 2020-12-07 DIAGNOSIS — K922 Gastrointestinal hemorrhage, unspecified: Secondary | ICD-10-CM | POA: Insufficient documentation

## 2020-12-07 DIAGNOSIS — D500 Iron deficiency anemia secondary to blood loss (chronic): Secondary | ICD-10-CM

## 2020-12-07 DIAGNOSIS — I82431 Acute embolism and thrombosis of right popliteal vein: Secondary | ICD-10-CM

## 2020-12-07 LAB — CBC WITH DIFFERENTIAL (CANCER CENTER ONLY)
Abs Immature Granulocytes: 0.1 10*3/uL — ABNORMAL HIGH (ref 0.00–0.07)
Basophils Absolute: 0 10*3/uL (ref 0.0–0.1)
Basophils Relative: 0 %
Eosinophils Absolute: 0 10*3/uL (ref 0.0–0.5)
Eosinophils Relative: 1 %
HCT: 44.7 % (ref 36.0–46.0)
Hemoglobin: 15.6 g/dL — ABNORMAL HIGH (ref 12.0–15.0)
Immature Granulocytes: 2 %
Lymphocytes Relative: 16 %
Lymphs Abs: 0.9 10*3/uL (ref 0.7–4.0)
MCH: 36.8 pg — ABNORMAL HIGH (ref 26.0–34.0)
MCHC: 34.9 g/dL (ref 30.0–36.0)
MCV: 105.4 fL — ABNORMAL HIGH (ref 80.0–100.0)
Monocytes Absolute: 0.3 10*3/uL (ref 0.1–1.0)
Monocytes Relative: 5 %
Neutro Abs: 4.3 10*3/uL (ref 1.7–7.7)
Neutrophils Relative %: 76 %
Platelet Count: 277 10*3/uL (ref 150–400)
RBC: 4.24 MIL/uL (ref 3.87–5.11)
RDW: 15.9 % — ABNORMAL HIGH (ref 11.5–15.5)
WBC Count: 5.7 10*3/uL (ref 4.0–10.5)
nRBC: 0 % (ref 0.0–0.2)

## 2020-12-07 LAB — RETICULOCYTES
Immature Retic Fract: 25.5 % — ABNORMAL HIGH (ref 2.3–15.9)
RBC.: 4.32 MIL/uL (ref 3.87–5.11)
Retic Count, Absolute: 80.8 10*3/uL (ref 19.0–186.0)
Retic Ct Pct: 1.9 % (ref 0.4–3.1)

## 2020-12-07 LAB — FERRITIN: Ferritin: 517 ng/mL — ABNORMAL HIGH (ref 11–307)

## 2020-12-07 LAB — IRON AND TIBC
Iron: 47 ug/dL (ref 41–142)
Saturation Ratios: 17 % — ABNORMAL LOW (ref 21–57)
TIBC: 278 ug/dL (ref 236–444)
UIBC: 231 ug/dL (ref 120–384)

## 2020-12-07 NOTE — Telephone Encounter (Signed)
Pt was late and we did draw her labs, we did r/s her appt and a calendar was printed  Zakar Brosch

## 2020-12-10 ENCOUNTER — Inpatient Hospital Stay: Payer: Managed Care, Other (non HMO)

## 2020-12-13 ENCOUNTER — Other Ambulatory Visit: Payer: Self-pay

## 2020-12-13 ENCOUNTER — Inpatient Hospital Stay: Payer: Managed Care, Other (non HMO)

## 2020-12-13 VITALS — BP 147/83 | HR 84 | Temp 98.6°F | Resp 19

## 2020-12-13 DIAGNOSIS — D500 Iron deficiency anemia secondary to blood loss (chronic): Secondary | ICD-10-CM | POA: Diagnosis not present

## 2020-12-13 DIAGNOSIS — D509 Iron deficiency anemia, unspecified: Secondary | ICD-10-CM

## 2020-12-13 MED ORDER — SODIUM CHLORIDE 0.9 % IV SOLN
Freq: Once | INTRAVENOUS | Status: AC
  Filled 2020-12-13: qty 250

## 2020-12-13 MED ORDER — IRON SUCROSE 20 MG/ML IV SOLN
200.0000 mg | Freq: Once | INTRAVENOUS | Status: AC
  Administered 2020-12-13: 200 mg via INTRAVENOUS
  Filled 2020-12-13: qty 200

## 2020-12-13 NOTE — Patient Instructions (Signed)

## 2020-12-17 ENCOUNTER — Other Ambulatory Visit: Payer: Self-pay

## 2020-12-17 ENCOUNTER — Inpatient Hospital Stay: Payer: Managed Care, Other (non HMO)

## 2020-12-17 VITALS — BP 134/76 | HR 88 | Temp 98.1°F | Resp 19

## 2020-12-17 DIAGNOSIS — D509 Iron deficiency anemia, unspecified: Secondary | ICD-10-CM

## 2020-12-17 DIAGNOSIS — D500 Iron deficiency anemia secondary to blood loss (chronic): Secondary | ICD-10-CM | POA: Diagnosis not present

## 2020-12-17 MED ORDER — SODIUM CHLORIDE 0.9 % IV SOLN
Freq: Once | INTRAVENOUS | Status: AC
  Filled 2020-12-17: qty 250

## 2020-12-17 MED ORDER — IRON SUCROSE 20 MG/ML IV SOLN
200.0000 mg | Freq: Once | INTRAVENOUS | Status: AC
  Administered 2020-12-17: 200 mg via INTRAVENOUS
  Filled 2020-12-17: qty 200

## 2020-12-17 NOTE — Patient Instructions (Signed)

## 2020-12-20 ENCOUNTER — Other Ambulatory Visit: Payer: Self-pay | Admitting: Family

## 2020-12-20 DIAGNOSIS — K50919 Crohn's disease, unspecified, with unspecified complications: Secondary | ICD-10-CM

## 2020-12-24 ENCOUNTER — Other Ambulatory Visit: Payer: Self-pay

## 2020-12-24 ENCOUNTER — Inpatient Hospital Stay: Payer: Managed Care, Other (non HMO) | Attending: Family

## 2020-12-24 ENCOUNTER — Ambulatory Visit: Payer: Managed Care, Other (non HMO)

## 2020-12-24 VITALS — BP 123/64 | HR 89 | Temp 98.2°F | Resp 17

## 2020-12-24 DIAGNOSIS — D509 Iron deficiency anemia, unspecified: Secondary | ICD-10-CM

## 2020-12-24 DIAGNOSIS — D500 Iron deficiency anemia secondary to blood loss (chronic): Secondary | ICD-10-CM | POA: Insufficient documentation

## 2020-12-24 DIAGNOSIS — K922 Gastrointestinal hemorrhage, unspecified: Secondary | ICD-10-CM | POA: Insufficient documentation

## 2020-12-24 MED ORDER — SODIUM CHLORIDE 0.9 % IV SOLN
200.0000 mg | Freq: Once | INTRAVENOUS | Status: AC
  Administered 2020-12-24: 200 mg via INTRAVENOUS
  Filled 2020-12-24: qty 200

## 2020-12-24 MED ORDER — SODIUM CHLORIDE 0.9 % IV SOLN
Freq: Once | INTRAVENOUS | Status: AC
Start: 2020-12-24 — End: 2020-12-24
  Filled 2020-12-24: qty 250

## 2020-12-24 NOTE — Progress Notes (Signed)
Pt declined to stay for post infusion observation period. Pt stated she has tolerated medication multiple times prior without difficulty. Pt aware to call clinic with any questions or concerns. Pt verbalized understanding and had no further questions.  ? ?

## 2020-12-24 NOTE — Patient Instructions (Signed)

## 2021-01-10 ENCOUNTER — Inpatient Hospital Stay: Payer: Managed Care, Other (non HMO) | Admitting: Family

## 2021-01-31 ENCOUNTER — Other Ambulatory Visit: Payer: Self-pay | Admitting: Family

## 2021-01-31 ENCOUNTER — Inpatient Hospital Stay: Payer: Managed Care, Other (non HMO) | Admitting: Family

## 2021-02-04 ENCOUNTER — Telehealth: Payer: Self-pay | Admitting: *Deleted

## 2021-02-04 NOTE — Telephone Encounter (Signed)
Called to reschedule appointment - unable to  lvm - mailbox not set up - will call back Monday

## 2021-03-29 ENCOUNTER — Other Ambulatory Visit: Payer: Self-pay | Admitting: Family

## 2021-03-29 DIAGNOSIS — D500 Iron deficiency anemia secondary to blood loss (chronic): Secondary | ICD-10-CM

## 2021-03-30 ENCOUNTER — Encounter: Payer: Self-pay | Admitting: Family

## 2021-03-30 ENCOUNTER — Inpatient Hospital Stay: Payer: Managed Care, Other (non HMO) | Attending: Family

## 2021-03-30 ENCOUNTER — Inpatient Hospital Stay (HOSPITAL_BASED_OUTPATIENT_CLINIC_OR_DEPARTMENT_OTHER): Payer: Managed Care, Other (non HMO) | Admitting: Family

## 2021-03-30 ENCOUNTER — Other Ambulatory Visit: Payer: Self-pay

## 2021-03-30 VITALS — BP 155/95 | HR 96 | Temp 97.9°F | Resp 18 | Ht 62.0 in | Wt 159.1 lb

## 2021-03-30 DIAGNOSIS — D500 Iron deficiency anemia secondary to blood loss (chronic): Secondary | ICD-10-CM | POA: Insufficient documentation

## 2021-03-30 DIAGNOSIS — K922 Gastrointestinal hemorrhage, unspecified: Secondary | ICD-10-CM | POA: Insufficient documentation

## 2021-03-30 DIAGNOSIS — Z86718 Personal history of other venous thrombosis and embolism: Secondary | ICD-10-CM | POA: Insufficient documentation

## 2021-03-30 DIAGNOSIS — Z7901 Long term (current) use of anticoagulants: Secondary | ICD-10-CM | POA: Insufficient documentation

## 2021-03-30 DIAGNOSIS — I82431 Acute embolism and thrombosis of right popliteal vein: Secondary | ICD-10-CM

## 2021-03-30 DIAGNOSIS — K5090 Crohn's disease, unspecified, without complications: Secondary | ICD-10-CM | POA: Diagnosis not present

## 2021-03-30 LAB — CBC WITH DIFFERENTIAL (CANCER CENTER ONLY)
Abs Immature Granulocytes: 0.18 10*3/uL — ABNORMAL HIGH (ref 0.00–0.07)
Basophils Absolute: 0.1 10*3/uL (ref 0.0–0.1)
Basophils Relative: 1 %
Eosinophils Absolute: 0.4 10*3/uL (ref 0.0–0.5)
Eosinophils Relative: 2 %
HCT: 47.5 % — ABNORMAL HIGH (ref 36.0–46.0)
Hemoglobin: 16.2 g/dL — ABNORMAL HIGH (ref 12.0–15.0)
Immature Granulocytes: 1 %
Lymphocytes Relative: 13 %
Lymphs Abs: 2.7 10*3/uL (ref 0.7–4.0)
MCH: 35.4 pg — ABNORMAL HIGH (ref 26.0–34.0)
MCHC: 34.1 g/dL (ref 30.0–36.0)
MCV: 103.7 fL — ABNORMAL HIGH (ref 80.0–100.0)
Monocytes Absolute: 1.3 10*3/uL — ABNORMAL HIGH (ref 0.1–1.0)
Monocytes Relative: 6 %
Neutro Abs: 15.6 10*3/uL — ABNORMAL HIGH (ref 1.7–7.7)
Neutrophils Relative %: 77 %
Platelet Count: 365 10*3/uL (ref 150–400)
RBC: 4.58 MIL/uL (ref 3.87–5.11)
RDW: 14.6 % (ref 11.5–15.5)
WBC Count: 20.2 10*3/uL — ABNORMAL HIGH (ref 4.0–10.5)
nRBC: 0 % (ref 0.0–0.2)

## 2021-03-30 LAB — IRON AND TIBC
Iron: 144 ug/dL — ABNORMAL HIGH (ref 41–142)
Saturation Ratios: 50 % (ref 21–57)
TIBC: 289 ug/dL (ref 236–444)
UIBC: 145 ug/dL (ref 120–384)

## 2021-03-30 LAB — RETICULOCYTES
Immature Retic Fract: 11.2 % (ref 2.3–15.9)
RBC.: 4.51 MIL/uL (ref 3.87–5.11)
Retic Count, Absolute: 82.1 10*3/uL (ref 19.0–186.0)
Retic Ct Pct: 1.8 % (ref 0.4–3.1)

## 2021-03-30 LAB — FERRITIN: Ferritin: 427 ng/mL — ABNORMAL HIGH (ref 11–307)

## 2021-03-30 NOTE — Progress Notes (Signed)
Hematology and Oncology Follow Up Visit  Kari Hahn [id number] 1970/11/11 50 y.o. 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 She is symptomatic with fatigue.  She has not noted any blood loss. She states that her Crohn's flares were found to be due to prednisone withdrawal and she is now on low dose 10 mg PO daily long term. She states that she has not had a flare in a few months since getting back on the steroid.  She has a cataract in the right eye and states that they also feel the long term steroid has caused some damage. Her vision is blurry but she denies loss of vision.  No abnormal bruising, no petechiae.  No fever, chills, n/v, cough, rash, SOB, palpitations, abdominal pain or changes in bowel or bladder habits.  She states that her right buttock wound continues to heal.  No swelling in her extremities at this time.  Neuropathy in her hands and feet is unchanged from baseline.  She does have issues with balance at times due to the cataract in the right eye and blurry vision.  No falls or syncope to report.   ECOG Performance Status: 1 - Symptomatic but completely ambulatory  Medications:  Allergies as of 03/30/2021   No Known Allergies      Medication List        Accurate as of March 30, 2021  9:59 AM. If you have any questions, ask your nurse or doctor.          amLODipine-valsartan 5-320 MG tablet Commonly known as: EXFORGE Take 1 tablet by mouth daily.   Blink Tears 0.25 % Gel Generic drug: Polyethylene Glycol 400 Place 1 drop into both eyes at bedtime.   desmopressin 0.2 MG tablet Commonly known as:  DDAVP Take 0.2-0.4 mg by mouth at bedtime.   Diclofenac Sodium 3 % Gel Apply 1 application topically daily as needed (pain). Mix with Lidocaine   escitalopram 20 MG tablet Commonly known as: LEXAPRO Take 40 mg by mouth at bedtime. 2  20 mg tab = 40 mg daily   ezetimibe 10 MG tablet Commonly known as: ZETIA Take 10 mg by mouth daily.   fluticasone 50 MCG/ACT nasal spray Commonly known as: FLONASE Place 1 spray into both nostrils daily.   folic acid 1 MG tablet Commonly known as: FOLVITE Take 1 mg by mouth daily.   furosemide 40 MG tablet Commonly known as: LASIX Take 40 mg by mouth daily.   gabapentin 800 MG tablet Commonly known as: NEURONTIN Take 800 mg by mouth 3 (three) times daily.   lidocaine-prilocaine cream Commonly known as: EMLA Apply 1 application topically daily as needed (pain). Mix with Diclofenac   ondansetron 8 MG tablet Commonly known as: ZOFRAN TAKE 1 TABLET BY MOUTH 2 TIMES A DAY   oxyCODONE-acetaminophen 10-325 MG tablet Commonly known as: PERCOCET Take by mouth 4 (four) times daily. Pt takes on a schedule   pantoprazole 40 MG tablet Commonly known as: PROTONIX Take 40 mg by mouth daily.   potassium chloride SA 20 MEQ tablet Commonly known as: KLOR-CON Take 20 mEq by mouth every  evening.   predniSONE 20 MG tablet Commonly known as: DELTASONE Take 20 mg by mouth daily with breakfast.   ProAir HFA 108 (90 Base) MCG/ACT inhaler Generic drug: albuterol Inhale 2 puffs into the lungs every 4 (four) hours as needed for wheezing or shortness of breath.   promethazine 12.5 MG tablet Commonly known as: PHENERGAN Take 6.25 mg by mouth 2 (two) times daily as needed for nausea or vomiting.   Soothe XP Soln Place 1 drop into both eyes 3 (three) times daily as needed (dry eyes).   Stelara 90 MG/ML Sosy injection Generic drug: ustekinumab Inject 90 mg into the skin every 30 (thirty) days. Last dose 05/27/20.   VITAMIN B 12 PO Take 1 tablet by  mouth daily.   vitamin C 1000 MG tablet Take 1,000 mg by mouth daily.   Vitamin D3 125 MCG (5000 UT) Tabs Take 5,000 Units by mouth every morning.   Xarelto 10 MG Tabs tablet Generic drug: rivaroxaban TAKE 1 TABLET BY MOUTH EVERY DAY WITH SUPPER What changed: See the new instructions.   zinc gluconate 50 MG tablet Take 50 mg by mouth daily.   ZTlido 1.8 % Ptch Generic drug: Lidocaine Apply 1 patch topically daily as needed for pain.        Allergies: No Known Allergies  Past Medical History, Surgical history, Social history, and Family History were reviewed and updated.  Review of Systems: All other 10 point review of systems is negative.   Physical Exam:  vitals were not taken for this visit.   Wt Readings from Last 3 Encounters:  10/27/20 170 lb 12.8 oz (77.5 kg)  10/19/20 170 lb 12.8 oz (77.5 kg)  08/09/20 184 lb 12.8 oz (83.8 kg)    Ocular: Sclerae unicteric, pupils equal, round and reactive to light Ear-nose-throat: Oropharynx clear, dentition fair Lymphatic: No cervical or supraclavicular adenopathy Lungs no rales or rhonchi, good excursion bilaterally Heart regular rate and rhythm, no murmur appreciated Abd soft, nontender, positive bowel sounds MSK no focal spinal tenderness, no joint edema Neuro: non-focal, well-oriented, appropriate affect Breasts: Deferred   Lab Results  Component Value Date   WBC 5.7 12/07/2020   HGB 15.6 (H) 12/07/2020   HCT 44.7 12/07/2020   MCV 105.4 (H) 12/07/2020   PLT 277 12/07/2020   Lab Results  Component Value Date   FERRITIN 517 (H) 12/07/2020   IRON 47 12/07/2020   TIBC 278 12/07/2020   UIBC 231 12/07/2020   IRONPCTSAT 17 (L) 12/07/2020   Lab Results  Component Value Date   RETICCTPCT 1.9 12/07/2020   RBC 4.24 12/07/2020   RETICCTABS 88.8 12/21/2014   No results found for: KPAFRELGTCHN, LAMBDASER, KAPLAMBRATIO No results found for: IGGSERUM, IGA, IGMSERUM No results found for: Odetta Pink, SPEI   Chemistry      Component Value Date/Time   NA 136 10/19/2020 1013   NA 141 03/12/2017 0929   K 3.0 (L) 10/19/2020 1013   K 2.8 (LL) 03/12/2017 0929   CL 100 10/19/2020 1013   CL 106 12/08/2011 1145   CO2 26 10/19/2020 1013   CO2 27 03/12/2017 0929   BUN 18 10/19/2020 1013   BUN 9.4 03/12/2017 0929   CREATININE 1.26 (H) 10/19/2020 1013   CREATININE 1.28 (H) 01/07/2019 0941   CREATININE 1.2 (H) 03/12/2017 0929      Component Value Date/Time   CALCIUM 9.5 10/19/2020 1013   CALCIUM 9.5 03/12/2017 0929   ALKPHOS 92 01/07/2019  0941   ALKPHOS 125 03/12/2017 0929   AST 12 (L) 01/07/2019 0941   AST 13 03/12/2017 0929   ALT 9 01/07/2019 0941   ALT 9 03/12/2017 0929   BILITOT 0.2 (L) 01/07/2019 0941   BILITOT <0.22 03/12/2017 0929       Impression and Plan: Ms. Goral is a very pleasant 50 yo caucasian female with iron deficiency anemia due to GI blood loss with Crohn's as well as history of thrombotic disease on anticoagulation.   Iron studies pending. We will replace if needed.  Follow-up in 3 months.  She can contact our office with any questions or concerns.   Lottie Dawson, NP 10/5/20229:59 AM

## 2021-05-12 ENCOUNTER — Other Ambulatory Visit: Payer: Self-pay | Admitting: Hematology & Oncology

## 2021-05-12 DIAGNOSIS — I749 Embolism and thrombosis of unspecified artery: Secondary | ICD-10-CM

## 2021-05-13 ENCOUNTER — Encounter: Payer: Self-pay | Admitting: Family

## 2021-05-13 ENCOUNTER — Other Ambulatory Visit: Payer: Self-pay | Admitting: Hematology & Oncology

## 2021-05-13 DIAGNOSIS — I749 Embolism and thrombosis of unspecified artery: Secondary | ICD-10-CM

## 2021-06-03 ENCOUNTER — Telehealth: Payer: Self-pay | Admitting: *Deleted

## 2021-06-03 NOTE — Telephone Encounter (Signed)
Message received from patient stating that she has a UTI with blood in her urine and is currently on antibiotics and would like to know if she should continue the Xarelto.  Dr. Marin Olp notified.  Call placed back to patient and patient notified per order of Dr. Marin Olp to continue Xarelto.  Teach back done.  Pt appreciative of call back and has no further questions at this time.

## 2021-06-26 ENCOUNTER — Encounter: Payer: Self-pay | Admitting: Family

## 2021-07-01 ENCOUNTER — Inpatient Hospital Stay (HOSPITAL_BASED_OUTPATIENT_CLINIC_OR_DEPARTMENT_OTHER): Payer: Medicaid Other | Admitting: Family

## 2021-07-01 ENCOUNTER — Encounter: Payer: Self-pay | Admitting: Family

## 2021-07-01 ENCOUNTER — Inpatient Hospital Stay: Payer: Medicaid Other | Attending: Family

## 2021-07-01 ENCOUNTER — Telehealth: Payer: Self-pay | Admitting: *Deleted

## 2021-07-01 ENCOUNTER — Other Ambulatory Visit: Payer: Self-pay

## 2021-07-01 VITALS — BP 137/87 | HR 87 | Temp 98.6°F | Resp 19 | Ht 62.0 in | Wt 162.1 lb

## 2021-07-01 DIAGNOSIS — Z7901 Long term (current) use of anticoagulants: Secondary | ICD-10-CM | POA: Insufficient documentation

## 2021-07-01 DIAGNOSIS — D509 Iron deficiency anemia, unspecified: Secondary | ICD-10-CM | POA: Diagnosis not present

## 2021-07-01 DIAGNOSIS — K5090 Crohn's disease, unspecified, without complications: Secondary | ICD-10-CM | POA: Diagnosis not present

## 2021-07-01 DIAGNOSIS — I82431 Acute embolism and thrombosis of right popliteal vein: Secondary | ICD-10-CM

## 2021-07-01 DIAGNOSIS — D500 Iron deficiency anemia secondary to blood loss (chronic): Secondary | ICD-10-CM

## 2021-07-01 DIAGNOSIS — K922 Gastrointestinal hemorrhage, unspecified: Secondary | ICD-10-CM | POA: Insufficient documentation

## 2021-07-01 LAB — IRON AND IRON BINDING CAPACITY (CC-WL,HP ONLY)
Iron: 74 ug/dL (ref 28–170)
Saturation Ratios: 22 % (ref 10.4–31.8)
TIBC: 339 ug/dL (ref 250–450)
UIBC: 265 ug/dL (ref 148–442)

## 2021-07-01 LAB — CBC WITH DIFFERENTIAL (CANCER CENTER ONLY)
Abs Immature Granulocytes: 0.42 10*3/uL — ABNORMAL HIGH (ref 0.00–0.07)
Basophils Absolute: 0.1 10*3/uL (ref 0.0–0.1)
Basophils Relative: 1 %
Eosinophils Absolute: 0.3 10*3/uL (ref 0.0–0.5)
Eosinophils Relative: 2 %
HCT: 48.5 % — ABNORMAL HIGH (ref 36.0–46.0)
Hemoglobin: 16.1 g/dL — ABNORMAL HIGH (ref 12.0–15.0)
Immature Granulocytes: 3 %
Lymphocytes Relative: 16 %
Lymphs Abs: 2.5 10*3/uL (ref 0.7–4.0)
MCH: 35.9 pg — ABNORMAL HIGH (ref 26.0–34.0)
MCHC: 33.2 g/dL (ref 30.0–36.0)
MCV: 108 fL — ABNORMAL HIGH (ref 80.0–100.0)
Monocytes Absolute: 1 10*3/uL (ref 0.1–1.0)
Monocytes Relative: 6 %
Neutro Abs: 11.9 10*3/uL — ABNORMAL HIGH (ref 1.7–7.7)
Neutrophils Relative %: 72 %
Platelet Count: 367 10*3/uL (ref 150–400)
RBC: 4.49 MIL/uL (ref 3.87–5.11)
RDW: 14 % (ref 11.5–15.5)
WBC Count: 16.2 10*3/uL — ABNORMAL HIGH (ref 4.0–10.5)
nRBC: 0.1 % (ref 0.0–0.2)

## 2021-07-01 LAB — RETICULOCYTES
Immature Retic Fract: 27.9 % — ABNORMAL HIGH (ref 2.3–15.9)
RBC.: 4.55 MIL/uL (ref 3.87–5.11)
Retic Count, Absolute: 102.4 10*3/uL (ref 19.0–186.0)
Retic Ct Pct: 2.3 % (ref 0.4–3.1)

## 2021-07-01 LAB — FERRITIN: Ferritin: 221 ng/mL (ref 11–307)

## 2021-07-01 NOTE — Telephone Encounter (Signed)
Per 07/01/21 los - called and gave upcoming appointments - confirmed

## 2021-07-01 NOTE — Progress Notes (Signed)
Hematology and Oncology Follow Up Visit  Kari Hahn [id number] [birth year] 51 y.o. 07/01/2021   Principle Diagnosis:  Right lower extremity DVT Idiopathic aortic thrombosis Pernicious anemia Iron deficiency anemia Crohn's disease JAK-2 negative 08/24/2009 Hyper coag work up - positive lupus anticoagulant 08/07/2009    Current Therapy:        Xarelto 20 mg PO daily  Vitamin B12 PO BID daily IV iron as needed               Interim History:  Kari Hahn is here today with her son for follow-up. She is recuperating from having had 2 falls in 1 day in early December. This resulted in left acute comminuted proximal humeral fracture and left comminutes fracture involving the lateral tibial plateau. She has not required surgery so far and started PT last week. She is still having left shoulder pain and pain in the bicep. She states that this area is taking longer to heal.  She has not noted any obvious blood loss. No bruising or petechiae.  No numbness or tingling in her extremities.  No new falls or syncope to report.  No fever, chills, n/v, cough, rash, dizziness, SOB, chest pain, palpitations, abdominal pain or changes in bowel or bladder habits.  She is doing well on low prednisone and has not had any recent Crohn's flare.  She has been eating well and is doing her best to stay well hydrated. Her weight is 162 lbs.   ECOG Performance Status: 2 - Symptomatic, <50% confined to bed  Medications:  Allergies as of 07/01/2021   No Known Allergies      Medication List        Accurate as of July 01, 2021 11:04 AM. If you have any questions, ask your nurse or doctor.          amLODipine-valsartan 5-320 MG tablet Commonly known as: EXFORGE Take 1 tablet by mouth daily.   Blink Tears 0.25 % Gel Generic drug: Polyethylene Glycol 400 Place 1 drop into both eyes at bedtime.   desmopressin 0.2 MG tablet Commonly known as: DDAVP Take 0.2-0.4 mg by mouth at bedtime.   Diclofenac  Sodium 3 % Gel Apply 1 application topically daily as needed (pain). Mix with Lidocaine   escitalopram 20 MG tablet Commonly known as: LEXAPRO Take 40 mg by mouth at bedtime. 2  20 mg tab = 40 mg daily   ezetimibe 10 MG tablet Commonly known as: ZETIA Take 10 mg by mouth daily.   fluticasone 50 MCG/ACT nasal spray Commonly known as: FLONASE Place 1 spray into both nostrils daily.   folic acid 1 MG tablet Commonly known as: FOLVITE Take 1 mg by mouth daily.   furosemide 40 MG tablet Commonly known as: LASIX Take 40 mg by mouth daily.   gabapentin 800 MG tablet Commonly known as: NEURONTIN Take 800 mg by mouth 3 (three) times daily.   lidocaine-prilocaine cream Commonly known as: EMLA Apply 1 application topically daily as needed (pain). Mix with Diclofenac   ondansetron 8 MG tablet Commonly known as: ZOFRAN TAKE 1 TABLET BY MOUTH 2 TIMES A DAY   oxyCODONE-acetaminophen 10-325 MG tablet Commonly known as: PERCOCET Take by mouth 4 (four) times daily. Pt takes on a schedule   pantoprazole 40 MG tablet Commonly known as: PROTONIX Take 40 mg by mouth daily.   potassium chloride SA 20 MEQ tablet Commonly known as: KLOR-CON M Take 20 mEq by mouth every evening.   predniSONE 5 MG  tablet Commonly known as: DELTASONE Take 1 tablet by mouth daily. What changed: Another medication with the same name was removed. Continue taking this medication, and follow the directions you see here. Changed by: Lottie Dawson, NP   predniSONE 2.5 MG tablet Commonly known as: DELTASONE Take 1 tablet by mouth daily. What changed: Another medication with the same name was removed. Continue taking this medication, and follow the directions you see here. Changed by: Lottie Dawson, NP   ProAir HFA 108 [phone] Base) MCG/ACT inhaler Generic drug: albuterol Inhale 2 puffs into the lungs every 4 (four) hours as needed for wheezing or shortness of breath.   promethazine 12.5 MG tablet Commonly known  as: PHENERGAN Take 6.25 mg by mouth 2 (two) times daily as needed for nausea or vomiting.   Soothe XP Soln Place 1 drop into both eyes 3 (three) times daily as needed (dry eyes).   Stelara 90 MG/ML Sosy injection Generic drug: ustekinumab Inject 90 mg into the skin every 30 (thirty) days. Last dose 05/27/20.   VITAMIN B 12 PO Take 1 tablet by mouth daily.   vitamin C 1000 MG tablet Take 1,000 mg by mouth daily.   Vitamin D3 125 MCG (5000 UT) Tabs Take 5,000 Units by mouth every morning.   Xarelto 10 MG Tabs tablet Generic drug: rivaroxaban TAKE 1 TABLET BY MOUTH EVERY DAY WITH SUPPER   zinc gluconate 50 MG tablet Take 50 mg by mouth daily.   ZTlido 1.8 % Ptch Generic drug: Lidocaine Apply 1 patch topically daily as needed for pain.        Allergies: No Known Allergies  Past Medical History, Surgical history, Social history, and Family History were reviewed and updated.  Review of Systems: All other 10 point review of systems is negative.   Physical Exam:  height is 5\' 2"  (1.575 m) and weight is 162 lb 1.6 oz (73.5 kg). Her oral temperature is 98.6 F (37 C). Her blood pressure is 137/87 and her pulse is 87. Her respiration is 19 and oxygen saturation is 96%.   Wt Readings from Last 3 Encounters:  07/01/21 162 lb 1.6 oz (73.5 kg)  03/30/21 159 lb 1.9 oz (72.2 kg)  10/27/20 170 lb 12.8 oz (77.5 kg)    Ocular: Sclerae unicteric, pupils equal, round and reactive to light Ear-nose-throat: Oropharynx clear, dentition fair Lymphatic: No cervical or supraclavicular adenopathy Lungs no rales or rhonchi, good excursion bilaterally Heart regular rate and rhythm, no murmur appreciated Abd soft, nontender, positive bowel sounds MSK no focal spinal tenderness, no joint edema Neuro: non-focal, well-oriented, appropriate affect Breasts: Deferred   Lab Results  Component Value Date   WBC 16.2 (H) 07/01/2021   HGB 16.1 (H) 07/01/2021   HCT 48.5 (H) 07/01/2021   MCV  108.0 (H) 07/01/2021   PLT 367 07/01/2021   Lab Results  Component Value Date   FERRITIN 427 (H) 03/30/2021   IRON 144 (H) 03/30/2021   TIBC 289 03/30/2021   UIBC 145 03/30/2021   IRONPCTSAT 50 03/30/2021   Lab Results  Component Value Date   RETICCTPCT 2.3 07/01/2021   RBC 4.55 07/01/2021   RBC 4.49 07/01/2021   RETICCTABS 88.8 12/21/2014   No results found for: KPAFRELGTCHN, LAMBDASER, KAPLAMBRATIO No results found for: IGGSERUM, IGA, IGMSERUM No results found for: Ronnald Ramp, A1GS, A2GS, Violet Baldy, MSPIKE, SPEI   Chemistry      Component Value Date/Time   NA 136 10/19/2020 1013   NA 141 03/12/2017 0929  K 3.0 (L) 10/19/2020 1013   K 2.8 (LL) 03/12/2017 0929   CL 100 10/19/2020 1013   CL 106 12/08/2011 1145   CO2 26 10/19/2020 1013   CO2 27 03/12/2017 0929   BUN 18 10/19/2020 1013   BUN 9.4 03/12/2017 0929   CREATININE 1.26 (H) 10/19/2020 1013   CREATININE 1.28 (H) 01/07/2019 0941   CREATININE 1.2 (H) 03/12/2017 0929      Component Value Date/Time   CALCIUM 9.5 10/19/2020 1013   CALCIUM 9.5 03/12/2017 0929   ALKPHOS 92 01/07/2019 0941   ALKPHOS 125 03/12/2017 0929   AST 12 (L) 01/07/2019 0941   AST 13 03/12/2017 0929   ALT 9 01/07/2019 0941   ALT 9 03/12/2017 0929   BILITOT 0.2 (L) 01/07/2019 0941   BILITOT <0.22 03/12/2017 0929       Impression and Plan: Kari Hahn is a very pleasant 51 yo caucasian female with iron deficiency anemia due to GI blood loss with Crohn's as well as history of thrombotic disease on anticoagulation. Iron studies pending.  Follow-up in 4 months.  Lottie Dawson, NP 1/6/202311:04 AM

## 2021-07-14 ENCOUNTER — Other Ambulatory Visit: Payer: Self-pay

## 2021-07-14 ENCOUNTER — Emergency Department (HOSPITAL_COMMUNITY): Payer: Medicaid Other

## 2021-07-14 ENCOUNTER — Inpatient Hospital Stay (HOSPITAL_COMMUNITY)
Admission: EM | Admit: 2021-07-14 | Discharge: 2021-07-16 | DRG: 371 | Discharge status: Against Medical Advice | Payer: Medicaid Other | Attending: Family Medicine | Admitting: Family Medicine

## 2021-07-14 ENCOUNTER — Encounter (HOSPITAL_COMMUNITY): Payer: Self-pay | Admitting: Emergency Medicine

## 2021-07-14 DIAGNOSIS — N1831 Chronic kidney disease, stage 3a: Secondary | ICD-10-CM | POA: Diagnosis present

## 2021-07-14 DIAGNOSIS — Z87442 Personal history of urinary calculi: Secondary | ICD-10-CM

## 2021-07-14 DIAGNOSIS — I10 Essential (primary) hypertension: Secondary | ICD-10-CM

## 2021-07-14 DIAGNOSIS — Z20822 Contact with and (suspected) exposure to covid-19: Secondary | ICD-10-CM | POA: Diagnosis present

## 2021-07-14 DIAGNOSIS — A498 Other bacterial infections of unspecified site: Secondary | ICD-10-CM

## 2021-07-14 DIAGNOSIS — A0472 Enterocolitis due to Clostridium difficile, not specified as recurrent: Secondary | ICD-10-CM | POA: Diagnosis not present

## 2021-07-14 DIAGNOSIS — F419 Anxiety disorder, unspecified: Secondary | ICD-10-CM | POA: Diagnosis present

## 2021-07-14 DIAGNOSIS — K219 Gastro-esophageal reflux disease without esophagitis: Secondary | ICD-10-CM | POA: Diagnosis present

## 2021-07-14 DIAGNOSIS — K5090 Crohn's disease, unspecified, without complications: Secondary | ICD-10-CM | POA: Diagnosis present

## 2021-07-14 DIAGNOSIS — M549 Dorsalgia, unspecified: Secondary | ICD-10-CM | POA: Diagnosis present

## 2021-07-14 DIAGNOSIS — Z5329 Procedure and treatment not carried out because of patient's decision for other reasons: Secondary | ICD-10-CM | POA: Diagnosis not present

## 2021-07-14 DIAGNOSIS — J9601 Acute respiratory failure with hypoxia: Secondary | ICD-10-CM | POA: Diagnosis present

## 2021-07-14 DIAGNOSIS — N179 Acute kidney failure, unspecified: Secondary | ICD-10-CM | POA: Diagnosis not present

## 2021-07-14 DIAGNOSIS — Z7901 Long term (current) use of anticoagulants: Secondary | ICD-10-CM

## 2021-07-14 DIAGNOSIS — R6520 Severe sepsis without septic shock: Secondary | ICD-10-CM

## 2021-07-14 DIAGNOSIS — I129 Hypertensive chronic kidney disease with stage 1 through stage 4 chronic kidney disease, or unspecified chronic kidney disease: Secondary | ICD-10-CM | POA: Diagnosis present

## 2021-07-14 DIAGNOSIS — D751 Secondary polycythemia: Secondary | ICD-10-CM | POA: Diagnosis present

## 2021-07-14 DIAGNOSIS — A419 Sepsis, unspecified organism: Secondary | ICD-10-CM

## 2021-07-14 DIAGNOSIS — Z87440 Personal history of urinary (tract) infections: Secondary | ICD-10-CM

## 2021-07-14 DIAGNOSIS — Z8711 Personal history of peptic ulcer disease: Secondary | ICD-10-CM

## 2021-07-14 DIAGNOSIS — E860 Dehydration: Secondary | ICD-10-CM | POA: Diagnosis present

## 2021-07-14 DIAGNOSIS — E1122 Type 2 diabetes mellitus with diabetic chronic kidney disease: Secondary | ICD-10-CM | POA: Diagnosis present

## 2021-07-14 DIAGNOSIS — E2740 Unspecified adrenocortical insufficiency: Secondary | ICD-10-CM | POA: Diagnosis present

## 2021-07-14 DIAGNOSIS — Z79899 Other long term (current) drug therapy: Secondary | ICD-10-CM

## 2021-07-14 DIAGNOSIS — E1141 Type 2 diabetes mellitus with diabetic mononeuropathy: Secondary | ICD-10-CM | POA: Diagnosis present

## 2021-07-14 DIAGNOSIS — G8929 Other chronic pain: Secondary | ICD-10-CM | POA: Diagnosis present

## 2021-07-14 DIAGNOSIS — M479 Spondylosis, unspecified: Secondary | ICD-10-CM | POA: Diagnosis present

## 2021-07-14 DIAGNOSIS — R1084 Generalized abdominal pain: Secondary | ICD-10-CM

## 2021-07-14 DIAGNOSIS — M170 Bilateral primary osteoarthritis of knee: Secondary | ICD-10-CM | POA: Diagnosis present

## 2021-07-14 DIAGNOSIS — F17210 Nicotine dependence, cigarettes, uncomplicated: Secondary | ICD-10-CM | POA: Diagnosis present

## 2021-07-14 DIAGNOSIS — Z86718 Personal history of other venous thrombosis and embolism: Secondary | ICD-10-CM

## 2021-07-14 DIAGNOSIS — Z7952 Long term (current) use of systemic steroids: Secondary | ICD-10-CM

## 2021-07-14 DIAGNOSIS — Z86711 Personal history of pulmonary embolism: Secondary | ICD-10-CM

## 2021-07-14 DIAGNOSIS — Z972 Presence of dental prosthetic device (complete) (partial): Secondary | ICD-10-CM

## 2021-07-14 DIAGNOSIS — J189 Pneumonia, unspecified organism: Secondary | ICD-10-CM

## 2021-07-14 LAB — CBC WITH DIFFERENTIAL/PLATELET
Abs Immature Granulocytes: 0.07 10*3/uL (ref 0.00–0.07)
Basophils Absolute: 0.1 10*3/uL (ref 0.0–0.1)
Basophils Relative: 1 %
Eosinophils Absolute: 0.2 10*3/uL (ref 0.0–0.5)
Eosinophils Relative: 2 %
HCT: 55.1 % — ABNORMAL HIGH (ref 36.0–46.0)
Hemoglobin: 18.1 g/dL — ABNORMAL HIGH (ref 12.0–15.0)
Immature Granulocytes: 1 %
Lymphocytes Relative: 4 %
Lymphs Abs: 0.4 10*3/uL — ABNORMAL LOW (ref 0.7–4.0)
MCH: 35.4 pg — ABNORMAL HIGH (ref 26.0–34.0)
MCHC: 32.8 g/dL (ref 30.0–36.0)
MCV: 107.6 fL — ABNORMAL HIGH (ref 80.0–100.0)
Monocytes Absolute: 1.1 10*3/uL — ABNORMAL HIGH (ref 0.1–1.0)
Monocytes Relative: 11 %
Neutro Abs: 8.4 10*3/uL — ABNORMAL HIGH (ref 1.7–7.7)
Neutrophils Relative %: 81 %
Platelets: 323 10*3/uL (ref 150–400)
RBC: 5.12 MIL/uL — ABNORMAL HIGH (ref 3.87–5.11)
RDW: 14 % (ref 11.5–15.5)
WBC: 10.2 10*3/uL (ref 4.0–10.5)
nRBC: 0 % (ref 0.0–0.2)

## 2021-07-14 LAB — RESP PANEL BY RT-PCR (FLU A&B, COVID) ARPGX2
Influenza A by PCR: NEGATIVE
Influenza B by PCR: NEGATIVE
SARS Coronavirus 2 by RT PCR: NEGATIVE

## 2021-07-14 LAB — COMPREHENSIVE METABOLIC PANEL
ALT: 12 U/L (ref 0–44)
AST: 16 U/L (ref 15–41)
Albumin: 3.5 g/dL (ref 3.5–5.0)
Alkaline Phosphatase: 120 U/L (ref 38–126)
Anion gap: 15 (ref 5–15)
BUN: 57 mg/dL — ABNORMAL HIGH (ref 6–20)
CO2: 26 mmol/L (ref 22–32)
Calcium: 9.6 mg/dL (ref 8.9–10.3)
Chloride: 91 mmol/L — ABNORMAL LOW (ref 98–111)
Creatinine, Ser: 3.33 mg/dL — ABNORMAL HIGH (ref 0.44–1.00)
GFR, Estimated: 16 mL/min — ABNORMAL LOW (ref >60)
Glucose, Bld: 131 mg/dL — ABNORMAL HIGH (ref 70–99)
Potassium: 4.2 mmol/L (ref 3.5–5.1)
Sodium: 132 mmol/L — ABNORMAL LOW (ref 135–145)
Total Bilirubin: 0.8 mg/dL (ref 0.3–1.2)
Total Protein: 7 g/dL (ref 6.5–8.1)

## 2021-07-14 LAB — AMMONIA: Ammonia: 32 umol/L (ref 9–35)

## 2021-07-14 LAB — C DIFFICILE QUICK SCREEN W PCR REFLEX
C Diff antigen: POSITIVE — AB
C Diff toxin: NEGATIVE

## 2021-07-14 LAB — D-DIMER, QUANTITATIVE: D-Dimer, Quant: 2.16 ug/mL-FEU — ABNORMAL HIGH (ref 0.00–0.50)

## 2021-07-14 LAB — CLOSTRIDIUM DIFFICILE BY PCR, REFLEXED: Toxigenic C. Difficile by PCR: POSITIVE — AB

## 2021-07-14 LAB — LACTIC ACID, PLASMA
Lactic Acid, Venous: 1.7 mmol/L (ref 0.5–1.9)
Lactic Acid, Venous: 1.5 mmol/L (ref 0.5–1.9)

## 2021-07-14 LAB — LIPASE, BLOOD: Lipase: 18 U/L (ref 11–51)

## 2021-07-14 MED ORDER — PANTOPRAZOLE SODIUM 40 MG PO TBEC
40.0000 mg | DELAYED_RELEASE_TABLET | Freq: Every day | ORAL | Status: DC
  Administered 2021-07-14 – 2021-07-16 (×2): 40 mg via ORAL
  Filled 2021-07-14 (×3): qty 1

## 2021-07-14 MED ORDER — SODIUM CHLORIDE 0.9 % IV BOLUS
1000.0000 mL | Freq: Once | INTRAVENOUS | Status: AC
  Administered 2021-07-14: 1000 mL via INTRAVENOUS

## 2021-07-14 MED ORDER — FIDAXOMICIN 200 MG PO TABS
200.0000 mg | ORAL_TABLET | Freq: Two times a day (BID) | ORAL | Status: DC
  Administered 2021-07-14 – 2021-07-16 (×4): 200 mg via ORAL
  Filled 2021-07-14 (×5): qty 1

## 2021-07-14 MED ORDER — ONDANSETRON 4 MG PO TBDP
4.0000 mg | ORAL_TABLET | Freq: Once | ORAL | Status: AC
Start: 2021-07-14 — End: 2021-07-14
  Administered 2021-07-14: 4 mg via ORAL
  Filled 2021-07-14: qty 1

## 2021-07-14 MED ORDER — ONDANSETRON HCL 4 MG PO TABS
8.0000 mg | ORAL_TABLET | Freq: Once | ORAL | Status: AC
  Administered 2021-07-14: 8 mg via ORAL
  Filled 2021-07-14: qty 2

## 2021-07-14 MED ORDER — SODIUM CHLORIDE 0.9 % IV BOLUS
500.0000 mL | Freq: Once | INTRAVENOUS | Status: AC
  Administered 2021-07-14: 500 mL via INTRAVENOUS

## 2021-07-14 MED ORDER — SODIUM CHLORIDE 0.9 % IV SOLN: INTRAVENOUS | Status: AC

## 2021-07-14 MED ORDER — ONDANSETRON HCL 4 MG/2ML IJ SOLN
4.0000 mg | Freq: Once | INTRAMUSCULAR | Status: AC
  Administered 2021-07-14: 4 mg via INTRAVENOUS
  Filled 2021-07-14: qty 2

## 2021-07-14 MED ORDER — PREDNISONE 5 MG PO TABS
2.5000 mg | ORAL_TABLET | Freq: Every day | ORAL | Status: DC
  Administered 2021-07-14 – 2021-07-16 (×3): 2.5 mg via ORAL
  Filled 2021-07-14 (×3): qty 1

## 2021-07-14 MED ORDER — MORPHINE SULFATE (PF) 4 MG/ML IV SOLN
4.0000 mg | Freq: Once | INTRAVENOUS | Status: AC
  Administered 2021-07-14: 4 mg via INTRAVENOUS
  Filled 2021-07-14: qty 1

## 2021-07-14 MED ORDER — RIVAROXABAN 10 MG PO TABS
10.0000 mg | ORAL_TABLET | Freq: Every day | ORAL | Status: DC
  Administered 2021-07-14 – 2021-07-16 (×3): 10 mg via ORAL
  Filled 2021-07-14 (×3): qty 1

## 2021-07-14 MED ORDER — SODIUM CHLORIDE 0.9 % IV SOLN
500.0000 mg | Freq: Once | INTRAVENOUS | Status: AC
  Administered 2021-07-14: 500 mg via INTRAVENOUS
  Filled 2021-07-14: qty 5

## 2021-07-14 MED ORDER — OXYCODONE-ACETAMINOPHEN 5-325 MG PO TABS
1.0000 | ORAL_TABLET | Freq: Once | ORAL | Status: AC
Start: 2021-07-14 — End: 2021-07-14
  Administered 2021-07-14: 1 via ORAL
  Filled 2021-07-14: qty 1

## 2021-07-14 MED ORDER — PREDNISONE 5 MG PO TABS
5.0000 mg | ORAL_TABLET | Freq: Every day | ORAL | Status: DC
  Administered 2021-07-14 – 2021-07-16 (×3): 5 mg via ORAL
  Filled 2021-07-14 (×3): qty 1

## 2021-07-14 MED ORDER — ESCITALOPRAM OXALATE 20 MG PO TABS
40.0000 mg | ORAL_TABLET | Freq: Every day | ORAL | Status: DC
  Administered 2021-07-14 – 2021-07-15 (×2): 40 mg via ORAL
  Filled 2021-07-14: qty 2
  Filled 2021-07-14: qty 4

## 2021-07-14 MED ORDER — SODIUM CHLORIDE 0.9 % IV SOLN
1.0000 g | Freq: Once | INTRAVENOUS | Status: AC
  Administered 2021-07-14: 1 g via INTRAVENOUS
  Filled 2021-07-14: qty 10

## 2021-07-14 NOTE — ED Notes (Signed)
Pt incontinent of urine, bed pad changed and purewick applied.

## 2021-07-14 NOTE — Progress Notes (Deleted)
Interim note- Full H&P pending   States last Thursday started having diarrhea, nausea, vomiting, weakness, palpitations. Does endorse having palpitations before when her potassium would drop. States she couldn't hardly move and palpitations getting worse   Ate some applesauce yesterday, and the day prior some yogurt  Denies sick contacts. Denies chest pain. Endorses abdominal pain since last Thursday. States it hurts to touch it. Endorses ongoing diarrhea. Has had 8-10 bowel movements. Declines blood in stool. Has been vomiting, unsure of how many times. Denies blood in vomit. Macrobid recently changed to Keflex about 2-3 weeks ago due to [place] not helping with her UTI. Declines documented fevers tho does endorse feeling hot. Shivering just started about 10 minutes ago.   Hx of back pain and chronic pain which she takes percocet   Full code

## 2021-07-14 NOTE — ED Provider Notes (Addendum)
[facility] EMERGENCY DEPARTMENT Provider Note   CSN: [id number] Arrival date & time: 07/14/21  1046     History  Chief Complaint  Patient presents with   Chest Pain   Abdominal Pain    Kari Hahn is a 51 y.o. female.  Pt is a 51 yo wf with a hx of htn, gerd, crohn's disease, dm, ckd, dvt (on Xarelto), and anemia.  Pt called EMS today because of abdominal pain and sob.  Per EMS, initial O2 sat 79% on RA.  They put her on oxygen via [place], but her O2 sat did not go up, so they put her on a NRB.  Pt said she woke up a few days ago with abd pain and her abdomen is more swollen.  She is unsure when she last urinated.        Home Medications Prior to Admission medications   Medication Sig Start Date End Date Taking? Authorizing Provider  amLODipine-valsartan (EXFORGE) 5-320 MG tablet Take 1 tablet by mouth daily. 12/17/15  Yes [provider]  Artificial Tear Solution (SOOTHE XP) SOLN Place 1 drop into both eyes 3 (three) times daily as needed (dry eyes).   Yes [provider]  Ascorbic Acid (VITAMIN C) 1000 MG tablet Take 1,000 mg by mouth daily.   Yes [provider]  cephALEXin (KEFLEX) 250 MG capsule Take 250 mg by mouth at bedtime.   Yes [provider]  Cholecalciferol (VITAMIN D3) 5000 UNITS TABS Take 5,000 Units by mouth every morning.   Yes [provider]  Cyanocobalamin (VITAMIN B 12 PO) Take 1 tablet by mouth daily.   Yes [provider]  cyclobenzaprine (FLEXERIL) 10 MG tablet Take 10 mg by mouth 2 (two) times daily as needed for muscle spasms.   Yes [provider]  desmopressin (DDAVP) 0.2 MG tablet Take 0.2-0.4 mg by mouth at bedtime. 07/23/18  Yes [provider]  Diclofenac Sodium 3 % GEL Apply 1 application topically daily as needed (pain). Mix with Lidocaine   Yes [provider]  escitalopram (LEXAPRO) 20 MG tablet Take 40 mg by mouth at bedtime. 2  20 mg tab = 40 mg  daily   Yes [provider]  ezetimibe (ZETIA) 10 MG tablet Take 10 mg by mouth daily. 03/26/20  Yes [provider]  fluticasone (FLONASE) 50 MCG/ACT nasal spray Place 1 spray into both nostrils daily. 11/29/15  Yes [provider]  folic acid (FOLVITE) 1 MG tablet Take 1 mg by mouth daily.   Yes [provider]  furosemide (LASIX) 40 MG tablet Take 40 mg by mouth daily as needed for fluid. 07/01/20  Yes [provider]  gabapentin (NEURONTIN) 800 MG tablet Take 800 mg by mouth 3 (three) times daily.  02/07/17  Yes [provider]  lidocaine-prilocaine (EMLA) cream Apply 1 application topically daily as needed (pain). Mix with Diclofenac   Yes [provider]  ondansetron (ZOFRAN) 8 MG tablet TAKE 1 TABLET BY MOUTH 2 TIMES A DAY Patient taking differently: Take 8 mg by mouth 2 (two) times daily. 12/20/20  Yes Celso Amy, NP  oxyCODONE-acetaminophen (PERCOCET) 10-325 MG tablet Take by mouth 4 (four) times daily. Pt takes on a schedule 04/05/17  Yes [provider]  pantoprazole (PROTONIX) 40 MG tablet Take 40 mg by mouth daily. 08/03/20  Yes [provider]  Polyethylene Glycol 400 (BLINK TEARS) 0.25 % GEL Place 1 drop into both eyes at bedtime.  Yes [provider]  potassium chloride SA (K-DUR,KLOR-CON) 20 MEQ tablet Take 20 mEq by mouth every evening.  06/18/13  Yes Delo, Nathaneil Canary, MD  predniSONE (DELTASONE) 2.5 MG tablet Take 1 tablet by mouth daily. 06/01/21  Yes [provider]  predniSONE (DELTASONE) 5 MG tablet Take 1 tablet by mouth daily. 06/01/21  Yes [provider]  PROAIR HFA 108 (90 Base) MCG/ACT inhaler Inhale 2 puffs into the lungs every 4 (four) hours as needed for wheezing or shortness of breath. 03/25/20  Yes [provider]  promethazine (PHENERGAN) 12.5 MG tablet Take 6.25 mg by mouth 2 (two) times daily as needed for nausea or vomiting. 09/09/20  Yes [provider]  STELARA 90 MG/ML SOSY injection Inject 90 mg into the skin every 30 (thirty) days. Last dose 05/27/20. 05/26/20  Yes [provider]  XARELTO 10 MG TABS tablet TAKE 1 TABLET BY MOUTH EVERY DAY WITH SUPPER Patient taking differently: 10 mg daily. 05/13/21  Yes Volanda Napoleon, MD  zinc gluconate 50 MG tablet Take 50 mg by mouth daily.   Yes [provider]  ZTLIDO 1.8 % PTCH Apply 1 patch topically daily as needed for pain. 09/06/20  Yes [provider]      Allergies    Patient has no known allergies.    Review of Systems   Review of Systems  Respiratory:  Positive for shortness of breath.   Gastrointestinal:  Positive for abdominal distention and abdominal pain.  All other systems reviewed and are negative.  Physical Exam Updated Vital Signs BP (!) 123/100    Pulse (!) 104    Temp 97.8 F (36.6 C) (Oral)    Resp (!) 24    Ht 5' 2"  (1.575 m)    Wt 79.4 kg    LMP 05/24/2019    SpO2 93%    BMI 32.01 kg/m  Physical Exam Vitals and nursing note reviewed.  Constitutional:      Appearance: She is ill-appearing.  HENT:     Head: Normocephalic and atraumatic.  Eyes:     Extraocular Movements: Extraocular movements intact.     Pupils: Pupils are equal, round, and reactive to light.  Cardiovascular:     Rate and Rhythm: Regular rhythm. Tachycardia present.     Heart sounds: Normal heart sounds.  Pulmonary:     Effort: Pulmonary effort is normal.     Breath sounds: Normal breath sounds.  Abdominal:     General: Abdomen is protuberant.     Tenderness: There is generalized abdominal tenderness.  Musculoskeletal:        General: Normal range of motion.     Cervical back: Normal range of motion and neck supple.  Skin:    General: Skin is warm.     Capillary Refill: Capillary refill takes less than 2 seconds.  Neurological:     General: No focal deficit present.     Mental Status: She is alert and oriented to person, place, and time.   Psychiatric:        Mood and Affect: Mood normal.        Behavior: Behavior normal.    ED Results / Procedures / Treatments   Labs (all labs ordered are listed, but only abnormal results are displayed) Labs Reviewed  C DIFFICILE QUICK SCREEN W PCR REFLEX   - Abnormal; Notable for the following components:      Result Value   C Diff antigen POSITIVE (*)  All other components within normal limits  CBC WITH DIFFERENTIAL/PLATELET - Abnormal; Notable for the following components:   RBC 5.12 (*)    Hemoglobin 18.1 (*)    HCT 55.1 (*)    MCV 107.6 (*)    MCH 35.4 (*)    Neutro Abs 8.4 (*)    Lymphs Abs 0.4 (*)    Monocytes Absolute 1.1 (*)    All other components within normal limits  COMPREHENSIVE METABOLIC PANEL - Abnormal; Notable for the following components:   Sodium 132 (*)    Chloride 91 (*)    Glucose, Bld 131 (*)    BUN 57 (*)    Creatinine, Ser 3.33 (*)    GFR, Estimated 16 (*)    All other components within normal limits  D-DIMER, QUANTITATIVE - Abnormal; Notable for the following components:   D-Dimer, Quant 2.16 (*)    All other components within normal limits  RESP PANEL BY RT-PCR (FLU A&B, COVID) ARPGX2  CULTURE, BLOOD (ROUTINE X 2)  CULTURE, BLOOD (ROUTINE X 2)  GASTROINTESTINAL PANEL BY PCR, STOOL (REPLACES STOOL CULTURE)  CLOSTRIDIUM DIFFICILE BY PCR, REFLEXED  LIPASE, BLOOD  AMMONIA  LACTIC ACID, PLASMA  URINALYSIS, ROUTINE W REFLEX MICROSCOPIC  LACTIC ACID, PLASMA    EKG EKG Interpretation  Date/Time:  Thursday July 14 2021 10:57:23 EST Ventricular Rate:  119 PR Interval:  148 QRS Duration: 89 QT Interval:  326 QTC Calculation: 459 R Axis:   94 Text Interpretation: Sinus tachycardia Consider left atrial enlargement Borderline right axis deviation Consider left ventricular hypertrophy Borderline T abnormalities, inferior leads Since last tracing rate faster Confirmed by Isla Pence [phone]) on 07/14/2021 11:17:54 AM  Radiology CT ABDOMEN  PELVIS WO CONTRAST  Result Date: 07/14/2021 CLINICAL DATA:  Abdominal pain, chest pain, shortness of breath, abdominal distension and tenderness, fever EXAM: CT ABDOMEN AND PELVIS WITHOUT CONTRAST TECHNIQUE: Multidetector CT imaging of the abdomen and pelvis was performed following the standard protocol without IV contrast. RADIATION DOSE REDUCTION: This exam was performed according to the departmental dose-optimization program which includes automated exposure control, adjustment of the mA and/or kV according to patient size and/or use of iterative reconstruction technique. COMPARISON:  05/14/2020 FINDINGS: Lower chest: Dependent atelectasis RIGHT lower lobe. Atelectasis LEFT lower lobe with question accompanying infiltrate. Small focus of atelectasis laterally at base of lingula. Hepatobiliary: Gallbladder surgically absent.  Liver unremarkable. Pancreas: Normal appearance Spleen: Small lobular spleen with adjacent splenule. Adrenals/Urinary Tract: Chronic slightly nodular thickening of LEFT adrenal gland without discrete mass. RIGHT adrenal gland unremarkable. BILATERAL renal calculi. Significance cortical scarring and atrophy of RIGHT kidney with small cysts. No hydronephrosis or hydroureter. Bladder decompressed. Stomach/Bowel: Appendix not definitely visualized. Colon decompressed with mobile cecum extending across midline and pelvis which is. Stomach distended by fluid and small amount of gas. Dilated small bowel loops throughout abdomen with questionable mild wall thickening of terminal ileum cannot exclude enteritis. No focal area of narrowing/obstruction is identified. Remaining small bowel loops are dilated but demonstrate no additional wall thickening. No associated free air. Vascular/Lymphatic: Atherosclerotic calcifications aorta and iliac arteries. 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 No adenopathy. Reproductive: Unremarkable uterus and ovaries Other: No free  air or free fluid.  No hernia. Musculoskeletal: Osseous structures unremarkable. IMPRESSION: Dilated small bowel loops throughout abdomen with questionable mild wall thickening of terminal ileum cannot exclude enteritis. No focal point of obstruction identified. 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 This recommendation follows ACR consensus guidelines: White Paper of the ACR Incidental Findings Committee II on Vascular Findings. J Am Coll Radiol 2013; 10:789-794. BILATERAL renal calculi without hydronephrosis or hydroureter. LEFT basilar atelectasis with question accompanying infiltrate. Aortic Atherosclerosis (ICD10-I70.0). Electronically Signed   By: Lavonia Dana M.D.   On: 07/14/2021 12:47   DG Chest Portable 1 View  Result Date: 07/14/2021 CLINICAL DATA:  Dyspnea EXAM: PORTABLE CHEST 1 VIEW COMPARISON:  06/29/2014 chest radiograph. FINDINGS: Low lung volumes. Stable cardiomediastinal silhouette with normal heart size. No pneumothorax. No pleural effusion. No pulmonary edema. Mild hazy left lung base opacity. IMPRESSION: Low lung volumes. Mild hazy left lung base opacity, favor atelectasis. Electronically Signed   By: Ilona Sorrel M.D.   On: 07/14/2021 12:07    Procedures Procedures    Medications Ordered in ED Medications  0.9 %  sodium chloride infusion ( Intravenous New Bag/Given 07/14/21 1339)  sodium chloride 0.9 % bolus 500 mL (0 mLs Intravenous Stopped 07/14/21 1149)  sodium chloride 0.9 % bolus 1,000 mL (0 mLs Intravenous Stopped 07/14/21 1322)  sodium chloride 0.9 % bolus 1,000 mL (0 mLs Intravenous Stopped 07/14/21 1425)  morphine 4 MG/ML injection 4 mg (4 mg Intravenous Given 07/14/21 1325)  ondansetron (ZOFRAN) injection 4 mg (4 mg Intravenous Given 07/14/21 1324)  cefTRIAXone (ROCEPHIN) 1 g in sodium chloride 0.9 % 100 mL IVPB (0 g Intravenous Stopped 07/14/21 1419)  azithromycin (ZITHROMAX) 500 mg in sodium  chloride 0.9 % 250 mL IVPB (0 mg Intravenous Stopped 07/14/21 1526)    ED Course/ Medical Decision Making/ A&P                           Medical Decision Making Amount and/or Complexity of Data Reviewed Labs: ordered. Radiology: ordered.  Risk Prescription drug management. Decision regarding hospitalization.   Pt changed from her NRB to 3L oxygen (on 2L oxygen, her O2 sat was only 90%) and her O2 sat is in the mid to low 90s.    BP is low and she is tachycardic.  WBC is not elevated, but she has a left shift.  She has pna on CT and is hypoxic.  Due to this, a code sepsis was called.  She had already been given sepsis fluids due to severe AKI.  She is given rocephin and zithromax.  Pt's CT showed distended loops of bowel, but no obstruction.  She has not had n/v.  Her nurse reports that pt had a large diarrhea.  This is sent for c.diff and stool studies.  Pt is given IVFs for her AKI (new BUN of 57 and Cr of 3.3).    Covid/flu neg.  DDimer is elevated, but she is on Xarelto.  I think this can be explained by AKI and pna.  Her Cr is too high right now to get a CTA.    She is d/w [facility] for admission.  C. Diff also came back +.  CRITICAL CARE Performed by: Isla Pence   Total critical care time: 30 minutes  Critical care time was exclusive of separately billable procedures and treating other patients.  Critical care was necessary to treat or prevent imminent or life-threatening deterioration.  Critical care was time spent personally by me on the following activities: development of treatment plan with patient and/or surrogate as well as nursing, discussions with consultants, evaluation of patient's response to treatment, examination of patient, obtaining history from patient or surrogate,  ordering and performing treatments and interventions, ordering and review of laboratory studies, ordering and review of radiographic studies, pulse oximetry and re-evaluation of patient's  condition.           Final Clinical Impression(s) / ED Diagnoses Final diagnoses:  AKI (acute kidney injury) ([place])  Generalized abdominal pain  Community acquired pneumonia of left lower lobe of lung  Sepsis with acute renal failure without septic shock, due to unspecified organism, unspecified acute renal failure type ([place])  Acute respiratory failure with hypoxia ([place])  Clostridium difficile infection    Rx / DC Orders ED Discharge Orders     None         Isla Pence, MD 07/14/21 1403    Isla Pence, MD 07/14/21 1551

## 2021-07-14 NOTE — Progress Notes (Signed)
FPTS Brief Progress Note  S:Went to see patient, patient sleeping comfortably in bed.    O: BP 127/73    Pulse (!) 109    Temp 97.8 F (36.6 C) (Oral)    Resp 17    Ht 5\' 2"  (1.575 m)    Wt 79.4 kg    LMP 05/24/2019    SpO2 92%    BMI 32.01 kg/m     A/P: -Plans per day team - Orders reviewed. Labs for AM not ordered, which was adjusted as needed.    Holley Bouche, MD 07/14/2021, 10:34 PM PGY-1, [facility] Resident  Please page [phone] with questions.

## 2021-07-14 NOTE — ED Notes (Signed)
Pt placed on bed pan to have a bm at this time

## 2021-07-14 NOTE — ED Notes (Signed)
RN and phlebotomy unable to obtain second set of cultures

## 2021-07-14 NOTE — ED Notes (Signed)
Pt is asking for something to eat and drink admit provider paged at this time due to no orders

## 2021-07-14 NOTE — ED Notes (Signed)
Pt had a noted liquid bm at this time pt cleaned and changed. Pt continues to c/o being hot and wants the door opened. Used call bell multiple times to request the door to be opened. Pt advised that she is on contact precautions and technically the door can't stay open. Pt asked if she is going to get a room with air in it. Advised pt unsure if and when she'll get a room assignment

## 2021-07-14 NOTE — ED Triage Notes (Signed)
Pt arrives via EMS with complaints of Abd pain, CP, SOB. Pt abd very distended and painful to touch with 10/10 pain. Pt complains of fever. BP 103/60, ST 115. Initially sats 79% on room air- EMS placed on nonrebreather. Sats improved to 97%.

## 2021-07-14 NOTE — ED Notes (Signed)
Spoke with admit provider reference pt having something to eat and drink, and c/ nausea

## 2021-07-14 NOTE — Progress Notes (Signed)
Family Medicine Teaching Service Daily Progress Note Intern Pager: [phone]  Patient name: Kari Hahn Medical record number: [id number] Date of birth: Apr 07, 1971 Age: 51 y.o. Gender: female  Primary Care Provider: [place] Consultants: None Code Status: Full  Pt Overview and Major Events to Date:  1/19-admitted  Assessment and Plan:  Kari Hahn is a 51 year old female who presented with generalized weakness diarrhea and vomiting and found to be C. difficile positive.  PMH is significant for Crohn's disease, HTN, CKD, PE, DVT on Xarelto.  C. difficile infection Patient with 1 week history of worsening diarrhea and generalized weakness shortly after and switch medication to Keflex found to be C. difficile positive.  She remained afebrile overnight with fluid resuscitation. Still mildly tachycardia. She endorse nausea and reported having 3 watery BM this morning. Abdomen is tender with positive BS. Currently on fidaxomicin will consider to transition to PO Vancomycin after N/V improve. -Follow-up GIPP -Follow-up blood culture -Follow-up UA -Continue fidaxomicin (day 2/10) -Continue holding Keflex -Continue mIVF  Concern for possible PE With patient's past history of PE and elevated D-dimer there was concern for possible PE.  This morning she is currently on 5 L nasal cannula with O2 saturations at 93%.  When temporarily off oxygen patient desats to 87%.  She denies any chest pains but endorses mild dyspnea. Will follow up with VQ scan and if AKI is improved will consider getting a CTPE. -Follow-up with V/q. scan -O2 saturation goals > 90% -Continue monitoring respiratory status -CRM -Consider CTPE  Hypertension BP this morning was 154/89. patient's home medication includes amlodipine and Lasix. Due to her presentation of dehydration on admission will hold amlodipine and consider restarting later today or tomorrow morning. -Continue to hold home medication  lasix -Continue routine vitals  Hx of Crohn's She is on home medication of prednisone 7.5 mg daily -Continue home medication  AKI on CKD On admission patient was found to have prerenal AKI due to dehydration with creatinine of 3.3. BMP is pending will follow up with creatinine. -Continue mIVF -Avoid nephrotoxic agent -Hold lasix -Morning lab, BM  GERD -Continue home medication of artovastatin 40 mg  Anxiety Continue home medication of lexapro 40 mg at bedtime   FEN/GI: Regular diet  PPx: Xarelto Dispo:Home pending clinical improvement . Barriers include clinical status.   Subjective:  Kari Hahn was awake and laying in bed.  She says she is feeling better but still having nausea and 3 watery BM morning.  Objective: Temp:  [97.8 F (36.6 C)] 97.8 F (36.6 C) (01/19 1055) Pulse Rate:  [103-119] 109 (01/19 1700) Resp:  [14-24] 22 (01/19 1700) BP: (91-124)/(50-100) 103/61 (01/19 1700) SpO2:  [91 %-99 %] 92 % (01/19 1700) Weight:  [79.4 kg] 79.4 kg (01/19 1056) Physical Exam: General:Awake, well appearing, NAD HEENT: Atraumatic, MMM, No sclera icterus CV: RRR, no murmurs, normal S1/S2 Pulm: CTAB, on 5L [place] with OP2 sat of 93% Abd: Soft, distended and tender  Skin: dry, warm Ext: No BLE edema, +2 Pedal and radial pulse.   Laboratory: Recent Labs  Lab 07/14/21 1101  WBC 10.2  HGB 18.1*  HCT 55.1*  PLT 323   Recent Labs  Lab 07/14/21 1101  NA 132*  K 4.2  CL 91*  CO2 26  BUN 57*  CREATININE 3.33*  CALCIUM 9.6  PROT 7.0  BILITOT 0.8  ALKPHOS 120  ALT 12  AST 16  GLUCOSE 131*     Imaging/Diagnostic Tests: No new test  Adah Salvage,  Jenny Reichmann, MD 07/14/2021, 6:43 PM PGY-1, [place] Intern pager: [phone], text pages welcome
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
## 2021-07-14 NOTE — ED Provider Notes (Signed)
Per previous team plan, patient is being admitted by family medicine teaching service for further management.   Kari Hahn, [name], MD 07/14/21 1736

## 2021-07-15 ENCOUNTER — Other Ambulatory Visit (HOSPITAL_COMMUNITY): Payer: Self-pay

## 2021-07-15 ENCOUNTER — Observation Stay (HOSPITAL_COMMUNITY): Payer: Medicaid Other

## 2021-07-15 ENCOUNTER — Encounter: Payer: Self-pay | Admitting: Family

## 2021-07-15 DIAGNOSIS — J9601 Acute respiratory failure with hypoxia: Secondary | ICD-10-CM | POA: Diagnosis present

## 2021-07-15 DIAGNOSIS — N179 Acute kidney failure, unspecified: Secondary | ICD-10-CM | POA: Diagnosis present

## 2021-07-15 DIAGNOSIS — N1831 Chronic kidney disease, stage 3a: Secondary | ICD-10-CM | POA: Diagnosis present

## 2021-07-15 DIAGNOSIS — Z87440 Personal history of urinary (tract) infections: Secondary | ICD-10-CM | POA: Diagnosis not present

## 2021-07-15 DIAGNOSIS — A0472 Enterocolitis due to Clostridium difficile, not specified as recurrent: Secondary | ICD-10-CM | POA: Diagnosis not present

## 2021-07-15 DIAGNOSIS — K5090 Crohn's disease, unspecified, without complications: Secondary | ICD-10-CM | POA: Diagnosis present

## 2021-07-15 DIAGNOSIS — K219 Gastro-esophageal reflux disease without esophagitis: Secondary | ICD-10-CM | POA: Diagnosis present

## 2021-07-15 DIAGNOSIS — Z20822 Contact with and (suspected) exposure to covid-19: Secondary | ICD-10-CM | POA: Diagnosis present

## 2021-07-15 DIAGNOSIS — E1141 Type 2 diabetes mellitus with diabetic mononeuropathy: Secondary | ICD-10-CM | POA: Diagnosis present

## 2021-07-15 DIAGNOSIS — E2740 Unspecified adrenocortical insufficiency: Secondary | ICD-10-CM | POA: Diagnosis present

## 2021-07-15 DIAGNOSIS — E1122 Type 2 diabetes mellitus with diabetic chronic kidney disease: Secondary | ICD-10-CM | POA: Diagnosis present

## 2021-07-15 DIAGNOSIS — M170 Bilateral primary osteoarthritis of knee: Secondary | ICD-10-CM | POA: Diagnosis present

## 2021-07-15 DIAGNOSIS — Z8711 Personal history of peptic ulcer disease: Secondary | ICD-10-CM | POA: Diagnosis not present

## 2021-07-15 DIAGNOSIS — Z7901 Long term (current) use of anticoagulants: Secondary | ICD-10-CM | POA: Diagnosis not present

## 2021-07-15 DIAGNOSIS — M479 Spondylosis, unspecified: Secondary | ICD-10-CM | POA: Diagnosis present

## 2021-07-15 DIAGNOSIS — R1084 Generalized abdominal pain: Secondary | ICD-10-CM | POA: Diagnosis not present

## 2021-07-15 DIAGNOSIS — Z7952 Long term (current) use of systemic steroids: Secondary | ICD-10-CM | POA: Diagnosis not present

## 2021-07-15 DIAGNOSIS — G8929 Other chronic pain: Secondary | ICD-10-CM | POA: Diagnosis present

## 2021-07-15 DIAGNOSIS — Z79899 Other long term (current) drug therapy: Secondary | ICD-10-CM | POA: Diagnosis not present

## 2021-07-15 DIAGNOSIS — Z86718 Personal history of other venous thrombosis and embolism: Secondary | ICD-10-CM | POA: Diagnosis not present

## 2021-07-15 DIAGNOSIS — M549 Dorsalgia, unspecified: Secondary | ICD-10-CM | POA: Diagnosis present

## 2021-07-15 DIAGNOSIS — E860 Dehydration: Secondary | ICD-10-CM | POA: Diagnosis present

## 2021-07-15 DIAGNOSIS — F419 Anxiety disorder, unspecified: Secondary | ICD-10-CM | POA: Diagnosis present

## 2021-07-15 DIAGNOSIS — I129 Hypertensive chronic kidney disease with stage 1 through stage 4 chronic kidney disease, or unspecified chronic kidney disease: Secondary | ICD-10-CM | POA: Diagnosis present

## 2021-07-15 DIAGNOSIS — D751 Secondary polycythemia: Secondary | ICD-10-CM | POA: Diagnosis present

## 2021-07-15 LAB — BASIC METABOLIC PANEL
Anion gap: 16 — ABNORMAL HIGH (ref 5–15)
BUN: 49 mg/dL — ABNORMAL HIGH (ref 6–20)
CO2: 21 mmol/L — ABNORMAL LOW (ref 22–32)
Calcium: 8.6 mg/dL — ABNORMAL LOW (ref 8.9–10.3)
Chloride: 100 mmol/L (ref 98–111)
Creatinine, Ser: 2.05 mg/dL — ABNORMAL HIGH (ref 0.44–1.00)
GFR, Estimated: 29 mL/min — ABNORMAL LOW (ref >60)
Glucose, Bld: 110 mg/dL — ABNORMAL HIGH (ref 70–99)
Potassium: 3.4 mmol/L — ABNORMAL LOW (ref 3.5–5.1)
Sodium: 137 mmol/L (ref 135–145)

## 2021-07-15 LAB — GASTROINTESTINAL PANEL BY PCR, STOOL (REPLACES STOOL CULTURE)

## 2021-07-15 LAB — HIV ANTIBODY (ROUTINE TESTING W REFLEX): HIV Screen 4th Generation wRfx: NONREACTIVE

## 2021-07-15 MED ORDER — CALCIUM CARBONATE ANTACID 500 MG PO CHEW
1.0000 | CHEWABLE_TABLET | Freq: Once | ORAL | Status: AC
  Administered 2021-07-15: 200 mg via ORAL
  Filled 2021-07-15: qty 1

## 2021-07-15 MED ORDER — CALCIUM CARBONATE ANTACID 500 MG PO CHEW: 1.0000 | CHEWABLE_TABLET | Freq: Every day | ORAL | Status: DC

## 2021-07-15 MED ORDER — CALCIUM CARBONATE ANTACID 500 MG PO CHEW
1.0000 | CHEWABLE_TABLET | Freq: Once | ORAL | Status: AC
  Administered 2021-07-16: 200 mg via ORAL
  Filled 2021-07-15: qty 1

## 2021-07-15 MED ORDER — GABAPENTIN 400 MG PO CAPS
800.0000 mg | ORAL_CAPSULE | Freq: Three times a day (TID) | ORAL | Status: DC
  Administered 2021-07-15: 800 mg via ORAL
  Filled 2021-07-15: qty 2

## 2021-07-15 MED ORDER — SODIUM CHLORIDE 0.9 % IV SOLN: INTRAVENOUS | Status: AC

## 2021-07-15 MED ORDER — FOLIC ACID 1 MG PO TABS
1.0000 mg | ORAL_TABLET | Freq: Every day | ORAL | Status: DC
  Administered 2021-07-16: 1 mg via ORAL
  Filled 2021-07-15 (×2): qty 1

## 2021-07-15 MED ORDER — PROMETHAZINE HCL 12.5 MG PO TABS
6.2500 mg | ORAL_TABLET | Freq: Two times a day (BID) | ORAL | Status: DC | PRN
  Filled 2021-07-15: qty 1

## 2021-07-15 MED ORDER — PROCHLORPERAZINE EDISYLATE 10 MG/2ML IJ SOLN
10.0000 mg | Freq: Once | INTRAMUSCULAR | Status: AC
  Administered 2021-07-15: 10 mg via INTRAVENOUS
  Filled 2021-07-15: qty 2

## 2021-07-15 MED ORDER — ZINC SULFATE 220 (50 ZN) MG PO CAPS
220.0000 mg | ORAL_CAPSULE | Freq: Every day | ORAL | Status: DC
  Administered 2021-07-16: 220 mg via ORAL
  Filled 2021-07-15 (×2): qty 1

## 2021-07-15 MED ORDER — OXYCODONE-ACETAMINOPHEN 10-325 MG PO TABS: 1.0000 | ORAL_TABLET | Freq: Four times a day (QID) | ORAL | Status: DC

## 2021-07-15 MED ORDER — OXYCODONE-ACETAMINOPHEN 5-325 MG PO TABS
1.0000 | ORAL_TABLET | Freq: Four times a day (QID) | ORAL | Status: DC
  Administered 2021-07-15 – 2021-07-16 (×6): 1 via ORAL
  Filled 2021-07-15 (×6): qty 1

## 2021-07-15 MED ORDER — TECHNETIUM TO 99M ALBUMIN AGGREGATED
4.3000 | Freq: Once | INTRAVENOUS | Status: AC | PRN
  Administered 2021-07-15: 4.3 via INTRAVENOUS

## 2021-07-15 MED ORDER — EZETIMIBE 10 MG PO TABS
10.0000 mg | ORAL_TABLET | Freq: Every day | ORAL | Status: DC
  Administered 2021-07-15 – 2021-07-16 (×2): 10 mg via ORAL
  Filled 2021-07-15 (×2): qty 1

## 2021-07-15 MED ORDER — GABAPENTIN 300 MG PO CAPS
300.0000 mg | ORAL_CAPSULE | Freq: Three times a day (TID) | ORAL | Status: DC
  Administered 2021-07-15 – 2021-07-16 (×4): 300 mg via ORAL
  Filled 2021-07-15 (×4): qty 1

## 2021-07-15 MED ORDER — ONDANSETRON HCL 4 MG/2ML IJ SOLN
4.0000 mg | Freq: Once | INTRAMUSCULAR | Status: AC
  Administered 2021-07-15: 4 mg via INTRAVENOUS
  Filled 2021-07-15: qty 2

## 2021-07-15 MED ORDER — OXYCODONE HCL 5 MG PO TABS
10.0000 mg | ORAL_TABLET | Freq: Once | ORAL | Status: AC
  Administered 2021-07-15: 10 mg via ORAL
  Filled 2021-07-15: qty 2

## 2021-07-15 MED ORDER — CYCLOBENZAPRINE HCL 5 MG PO TABS: 10.0000 mg | ORAL_TABLET | Freq: Two times a day (BID) | ORAL | Status: DC | PRN

## 2021-07-15 MED ORDER — OXYCODONE HCL 5 MG PO TABS
5.0000 mg | ORAL_TABLET | Freq: Four times a day (QID) | ORAL | Status: DC
  Administered 2021-07-15 – 2021-07-16 (×6): 5 mg via ORAL
  Filled 2021-07-15 (×6): qty 1

## 2021-07-15 NOTE — Progress Notes (Signed)
FPTS Brief Progress Note  S:Patient sleeping comfortably in bed.   O: BP (!) 161/94 (BP Location: Right Arm)    Pulse (!) 118    Temp 98.3 F (36.8 C) (Oral)    Resp 18    Ht 5\' 2"  (1.575 m)    Wt 79.4 kg    LMP 05/24/2019    SpO2 95%    BMI 32.01 kg/m     A/P: - Plans per day team - Orders reviewed. Labs for AM ordered, which was adjusted as needed.   Holley Bouche, MD 07/15/2021, 10:48 PM PGY-1, [place] Night Resident  Please page [phone] with questions.

## 2021-07-15 NOTE — TOC Benefit Eligibility Note (Addendum)
Patient Teacher, English as a foreign language completed.    The patient is currently admitted and upon discharge could be taking Dificid 200 mg tablets.  Prior Authorization Required (must fail vancomycin)  The patient is insured through [place], [place] Patient Advocate Specialist [place] Patient Advocate Team Direct Number: [phone]  Fax: [phone]

## 2021-07-15 NOTE — ED Notes (Signed)
Pt to Nuc Med

## 2021-07-15 NOTE — Hospital Course (Addendum)
Kari Hahn is a 51 y.o. female who was admitted to the [facility] Teaching Service at [facility] for generalized weakness, diarrhea and vomiting. Hospital course is outlined below by system.   C. difficile infection Patient came in with 1 week of generalized weakness, diarrhea, and vomiting after switching antibiotics for UTI from Macrobid to Keflex 2 weeks prior.  On exam patient appears dehydrated with dry mucous membrane and started on maintenance IV fluid and due to her nausea was placed n.p.o. patient also complained of abdominal pain on exam has abdominal distention with some tenderness prompting abdominal CT which showed dilated small bowel loops with mild wall thickening of the terminal ileum.  For nausea patient received antiemetics as needed and was treated with IV fluid and fidaxomicin was eventually transitioned to p.o. vancomycin at time patient left AMA.    Acute hypoxic respiratory failure   concern for PE On admission patient was found to be hypoxic O2 saturations below 87% and was placed on 5L nasal cannula.  Patient denies any chest pain but endorses mild dyspnea.  Given her previous history of PE on Xarelto and elevated D-dimer VQ scan was obtained (over CTPE due to AKI) which came back negative.  Patient denies being on home oxygen at baseline prompting further work-up which included echo and chest x-ray. Given history of walking patient might have underlying COPD presentation.  Failed ambulatory pulse ox, order for home oxygen sent.   AKI Creatinine on admission was 3.3 which is around 1.4 at baseline. AKI is likely prerenal in the setting of dehydration likely secondary to poor p.o. intake with vomiting and diarrhea.  Reviewed resuscitation patient's creatinine improved to 1.59 at time patient left AMA.  Patient is to follow-up with PCP to recheck BMP after discharge.  Hypertension Patient was mildly hypotensive on admission with blood pressure of 91/66 likely in the setting of  dehydration.  With fluid resuscitation patient's blood pressure improved.  During hospitalization Home medications of exponential 5-320 mg and Lasix 40 mg held.  With improved blood pressure patient was restarted on amlodipine 5 mg.  Plan to follow-up with PCP about adjustments to blood pressure medication listed below.

## 2021-07-15 NOTE — Progress Notes (Signed)
Patient unable to turn or move for echo imaging due to pain.  She cannot tolerate exam at this time.

## 2021-07-15 NOTE — ED Notes (Signed)
Pt had liquid bm. Cleaned and changed at this time

## 2021-07-15 NOTE — ED Notes (Signed)
Breakfast tray not yet delivered. Service response confirmed order has been placed but not yet delivered.

## 2021-07-15 NOTE — Progress Notes (Addendum)
PT Cancellation Note  Patient Details Name: Kari Hahn MRN: [id number] DOB: 07/10/1970   Cancelled Treatment:    Reason Eval/Treat Not Completed: Medical issues which prohibited therapy Pt currently awaiting scan for PE. Will follow up as pt medically appropriate and as schedule allows.   1400: Pt awaiting further scan for PE and MD requesting to hold. Will follow up as pt medically appropriate.   Lou Miner, DPT  Acute Rehabilitation Services  Pager: [phone] Office: [phone]    [name] 07/15/2021, 10:29 AM

## 2021-07-15 NOTE — Progress Notes (Signed)
OT Cancellation Note  Patient Details Name: Kari Hahn MRN: [id number] DOB: [birth year]   Cancelled Treatment:    Reason Eval/Treat Not Completed: Medical issues which prohibited therapy  Pt currently awaiting scan for PE. Will follow up as pt medically appropriate and as schedule allows.   Jolaine Artist, OT Acute Rehabilitation Services Pager [phone] Office [phone]   Delight Stare 07/15/2021, 11:47 AM

## 2021-07-16 ENCOUNTER — Inpatient Hospital Stay (HOSPITAL_COMMUNITY): Payer: Medicaid Other

## 2021-07-16 LAB — CBC
HCT: 47.9 % — ABNORMAL HIGH (ref 36.0–46.0)
Hemoglobin: 15.8 g/dL — ABNORMAL HIGH (ref 12.0–15.0)
MCH: 35.2 pg — ABNORMAL HIGH (ref 26.0–34.0)
MCHC: 33 g/dL (ref 30.0–36.0)
MCV: 106.7 fL — ABNORMAL HIGH (ref 80.0–100.0)
Platelets: 324 10*3/uL (ref 150–400)
RBC: 4.49 MIL/uL (ref 3.87–5.11)
RDW: 13.6 % (ref 11.5–15.5)
WBC: 12.9 10*3/uL — ABNORMAL HIGH (ref 4.0–10.5)
nRBC: 0 % (ref 0.0–0.2)

## 2021-07-16 LAB — BASIC METABOLIC PANEL
Anion gap: 9 (ref 5–15)
BUN: 35 mg/dL — ABNORMAL HIGH (ref 6–20)
CO2: 21 mmol/L — ABNORMAL LOW (ref 22–32)
Calcium: 8.8 mg/dL — ABNORMAL LOW (ref 8.9–10.3)
Chloride: 107 mmol/L (ref 98–111)
Creatinine, Ser: 1.59 mg/dL — ABNORMAL HIGH (ref 0.44–1.00)
GFR, Estimated: 39 mL/min — ABNORMAL LOW (ref >60)
Glucose, Bld: 105 mg/dL — ABNORMAL HIGH (ref 70–99)
Potassium: 3.3 mmol/L — ABNORMAL LOW (ref 3.5–5.1)
Sodium: 137 mmol/L (ref 135–145)

## 2021-07-16 MED ORDER — AMLODIPINE BESYLATE 5 MG PO TABS
5.0000 mg | ORAL_TABLET | Freq: Every day | ORAL | Status: DC
  Administered 2021-07-16: 5 mg via ORAL
  Filled 2021-07-16: qty 1

## 2021-07-16 MED ORDER — ZONISAMIDE 100 MG PO CAPS: 100.0000 mg | ORAL_CAPSULE | Freq: Every day | ORAL | Status: DC

## 2021-07-16 MED ORDER — VANCOMYCIN HCL 125 MG PO CAPS: 125.0000 mg | ORAL_CAPSULE | Freq: Four times a day (QID) | ORAL | 0 refills | Status: AC

## 2021-07-16 MED ORDER — POTASSIUM CHLORIDE 20 MEQ PO PACK
40.0000 meq | PACK | Freq: Once | ORAL | Status: AC
  Administered 2021-07-16: 40 meq via ORAL
  Filled 2021-07-16: qty 2

## 2021-07-16 NOTE — Progress Notes (Signed)
She is still having to move her bowel many times per day. However, she has been drinking and eating well. She denies abdominal pain.  No acute findings on her physical exam.   A/P: C.diff enteritis - CT abdomen reviewed Fidaxomicin initiated  Will need to transition to oral Vancomycin at discharge due to insurance coverage  Acute hypoxic respiratory failure. Improved SOB with O2 supplementation V/Q scan neg for PE ECHO and CT chest pending to further evaluate  Continue home Xarelto for now. She will likely need to go home on O2. She will also benefit from outpatient PFT to r/o COPD. Prescribe albuterol as needed for home use. If her ECHO and CT chest are good, she can be discharged home today.  Acute on Chronic stage IIIa kidney disease Likely dehydration in the settings of diarrhea and vomiting Improved Oral hydration as tolerated

## 2021-07-16 NOTE — Progress Notes (Signed)
Family Medicine Teaching Service Daily Progress Note Intern Pager: [phone]  Patient name: Kari Hahn Medical record number: [id number] Date of birth: July 25, 1970 Age: 51 y.o. Gender: female  Primary Care Provider: [place] Consultants: None Code Status: Full  Pt Overview and Major Events to Date:  1/9 admitted  Assessment and Plan:  Kari Hahn is a 51 year old female who presented with generalized weakness, diarrhea and vomiting and found to be C. difficile positive.  PMH is significant for Crohn's disease, HTN, CKD, PE, DVT on Xarelto.  C. difficile infection Patient reports she continues to have watery bowel movements and recorded about 3 this morning.  She endorses improvement in her generalized weakness and abdominal tenderness.  GIP panel was negative and blood culture showed no growth in over 48 hours.  She is currently on fidaxomicin daily. -Continue fidaxomicin (day 3/10) -Antiemetic as needed -Plan to transition to p.o. vancomycin at discharge  Acute hypoxic respiratory failure Patient reports improvement in dyspnea.  On RA exam was normal.  Patient is currently on 5 L nasal cannula and satting at 93%.  Patient is now on supplemental oxygen at home at baseline.  VQ scan was negative.  Ambulatory pulse ox showed O2 saturation < 87%. Will obtain chest CT and echo for continued assessment for other etiologies such as CHF or interstitial lung disease. -O2 saturation goes > 90% -Continue monitor respiratory status -CRM -Follow-up with chest CT -Follow-up with echocardiogram  Hypertension Patient's BP this morning was 163/93.  On home medication of Exforge and Lasix.  We will start patient on amlodipine 5 mg she will follow-up outpatient with her PCP for continued management. -Start amlodipine 5 mg -Continue routine vitals  AKI on CKD Patient's creatinine this morning was 1.59 which is improved from time of admission.  Need to monitor creatinine with  daily BMP. -Avoid nephrotoxic agent -Continue monitoring labs, BMP  Other conditions chronic and stable GERD Anxiety Hx of Crohn's disease    FEN/GI: Regular diet PPx: Xarelto Dispo:Home pending clinical improvement . Barriers include pending imaging.   Subjective:  [name] was awake and laying in bed.  She reports nausea is improved but still endorses watery diarrhea and mild dyspnea.  Objective: Temp:  [98.3 F (36.8 C)-99.8 F (37.7 C)] 98.3 F (36.8 C) (01/21 1500) Pulse Rate:  [101-133] 110 (01/21 1500) Resp:  [16-21] 19 (01/21 1500) BP: (146-173)/(84-105) 173/101 (01/21 1500) SpO2:  [90 %-95 %] 90 % (01/21 1500) Physical Exam: General:Awake, well appearing, NAD HEENT: Atraumatic, MMM, No sclera icterus CV: RRR, no murmurs, normal S1/S2 Pulm: CTAB, good WOB on RA, no crackles or wheezing Abd: Soft, no distension, no tenderness Skin: dry, warm Ext: No BLE edema, +2 Pedal and radial pulse.   Laboratory: Recent Labs  Lab 07/14/21 1101 07/16/21 0058  WBC 10.2 12.9*  HGB 18.1* 15.8*  HCT 55.1* 47.9*  PLT 323 324   Recent Labs  Lab 07/14/21 1101 07/15/21 1042 07/16/21 0058  NA 132* 137 137  K 4.2 3.4* 3.3*  CL 91* 100 107  CO2 26 21* 21*  BUN 57* 49* 35*  CREATININE 3.33* 2.05* 1.59*  CALCIUM 9.6 8.6* 8.8*  PROT 7.0  --   --   BILITOT 0.8  --   --   ALKPHOS 120  --   --   ALT 12  --   --   AST 16  --   --   GLUCOSE 131* 110* 105*     Imaging/Diagnostic Tests: No  results found.   Alen Bleacher, MD 07/16/2021, 4:58 PM PGY-1, [place] Intern pager: [phone], text pages welcome

## 2021-07-16 NOTE — Discharge Instructions (Signed)
Dear Assunta Found,   Thank you for letting us participate in your care!   You were admitted because you were experiencing general weakness, nausea, vomiting and diarrhea.   You tested positive for C. Difficile and was treated with IV fluid, anti emetic and Fidoxamicin   You were also found to be hypoxic and was placed on oxygen supplements.  You improved and you were discharged from the hospital with plans to continue your medication at home.     POST-HOSPITAL & CARE INSTRUCTIONS Continue taking doxycycline 4 times daily Use the home oxygen provided at discharge  Please let PCP/Specialists know of any changes in medications that were made.  Please see medications section of this packet for any medication changes.   DOCTOR'S APPOINTMENTS & FOLLOW UP Future Appointments  Date Time Provider [place]  10/28/2021  8:30 AM [facility] LAB [facility] None  10/28/2021  8:45 AM Cel[place] Amy, NP [facility] None     Thank you for choosing [facility]! Take care and be well!  [facility]  [place], [place] [address] [phone]

## 2021-07-16 NOTE — Discharge Summary (Signed)
[facility] Discharge Summary  Patient name: Kari Hahn Medical record number: [id number] Date of birth: 17-Jan-1971 Age: 51 y.o. Gender: female Date of Admission: 07/14/2021  Date of Discharge: 07/16/21, left AMA Admitting Physician: Alen Bleacher, MD  Primary Care Provider: [place] Consultants: None  Indication for Hospitalization: C. Diff, acute hypoxic respiratory failure  Discharge Diagnoses/Problem List:  Principal Problem:   Clostridium difficile diarrhea Active Problems:   C. difficile diarrhea Acute hypoxic respiratory failure Hypertension AKI on CKD stage IIIa   Disposition: LEFT AMA  Discharge Condition: Stable  Discharge Exam:  Exam from earlier that morning, performed by Dr. Adah Salvage Physical Exam: General:Awake, well appearing, NAD HEENT: Atraumatic, MMM, No sclera icterus CV: RRR, no murmurs, normal S1/S2 Pulm: CTAB, good WOB on RA, no crackles or wheezing Abd: Soft, no distension, no tenderness Skin: dry, warm Ext: No BLE edema, +2 Pedal and radial pulse.    Brief Hospital Course:  Kari Hahn is a 51 y.o. female who was admitted to the [facility] Teaching Service at [facility] for generalized weakness, diarrhea and vomiting. Hospital course is outlined below by system.   C. difficile infection Patient came in with 1 week of generalized weakness, diarrhea, and vomiting after switching antibiotics for UTI from Macrobid to Keflex 2 weeks prior.  On exam patient appears dehydrated with dry mucous membrane and started on maintenance IV fluid and due to her nausea was placed n.p.o. patient also complained of abdominal pain on exam has abdominal distention with some tenderness prompting abdominal CT which showed dilated small bowel loops with mild wall thickening of the terminal ileum.  For nausea patient received antiemetics as needed and was treated with IV fluid and fidaxomicin was eventually transitioned to p.o.  vancomycin at time patient left AMA.    Acute hypoxic respiratory failure   concern for PE On admission patient was found to be hypoxic O2 saturations below 87% and was placed on 5L nasal cannula.  Patient denies any chest pain but endorses mild dyspnea.  Given her previous history of PE on Xarelto and elevated D-dimer VQ scan was obtained (over CTPE due to AKI) which came back negative.  Patient denies being on home oxygen at baseline prompting further work-up which included echo and chest x-ray. Given history of walking patient might have underlying COPD presentation.  Failed ambulatory pulse ox, order for home oxygen sent.   AKI Creatinine on admission was 3.3 which is around 1.4 at baseline. AKI is likely prerenal in the setting of dehydration likely secondary to poor p.o. intake with vomiting and diarrhea.  Reviewed resuscitation patient's creatinine improved to 1.59 at time patient left AMA.  Patient is to follow-up with PCP to recheck BMP after discharge.  Hypertension Patient was mildly hypotensive on admission with blood pressure of 91/66 likely in the setting of dehydration.  With fluid resuscitation patient's blood pressure improved.  During hospitalization Home medications of exponential 5-320 mg and Lasix 40 mg held.  With improved blood pressure patient was restarted on amlodipine 5 mg.  Plan to follow-up with PCP about adjustments to blood pressure medication listed below.    Issues for Follow Up:  Sent 8 days of Vancomycin to pharmacy for completion of C. Diff infection. Follow up on symptoms resolution.  Patient with desaturations and oxygen requirement, appears new. Unable to get Echo prior to patient leaving AMA. Consider Echo, PFTs to assess for underlying lung pathology. Order was sent for home oxygen. Was on Exforge  prior to admission. Discontinued exforge and  started on amlodipine 5 mg daily in the hospital.  Consider increasing to 10 mg daily of amlodipine or adjusting  Exforge to 10-160 given hypotension on arrival.  AKI on admission, consider repeat BMP.   Significant Procedures: None  Significant Labs and Imaging:  Recent Labs  Lab 07/14/21 1101 07/16/21 0058  WBC 10.2 12.9*  HGB 18.1* 15.8*  HCT 55.1* 47.9*  PLT 323 324   Recent Labs  Lab 07/14/21 1101 07/15/21 1042 07/16/21 0058  NA 132* 137 137  K 4.2 3.4* 3.3*  CL 91* 100 107  CO2 26 21* 21*  GLUCOSE 131* 110* 105*  BUN 57* 49* 35*  CREATININE 3.33* 2.05* 1.59*  CALCIUM 9.6 8.6* 8.8*  ALKPHOS 120  --   --   AST 16  --   --   ALT 12  --   --   ALBUMIN 3.5  --   --     Results/Tests Pending at Time of Discharge: None  Discharge Medications:  Allergies as of 07/16/2021   No Known Allergies      Medication List     STOP taking these medications    cephALEXin 250 MG capsule Commonly known as: KEFLEX       TAKE these medications    amLODipine-valsartan 5-320 MG tablet Commonly known as: EXFORGE Take 1 tablet by mouth daily.   Blink Tears 0.25 % Gel Generic drug: Polyethylene Glycol 400 Place 1 drop into both eyes at bedtime.   cyclobenzaprine 10 MG tablet Commonly known as: FLEXERIL Take 10 mg by mouth 2 (two) times daily as needed for muscle spasms.   desmopressin 0.2 MG tablet Commonly known as: DDAVP Take 0.2-0.4 mg by mouth at bedtime.   Diclofenac Sodium 3 % Gel Apply 1 application topically daily as needed (pain). Mix with Lidocaine   escitalopram 20 MG tablet Commonly known as: LEXAPRO Take 40 mg by mouth at bedtime. 2  20 mg tab = 40 mg daily   ezetimibe 10 MG tablet Commonly known as: ZETIA Take 10 mg by mouth daily.   fluticasone 50 MCG/ACT nasal spray Commonly known as: FLONASE Place 1 spray into both nostrils daily.   folic acid 1 MG tablet Commonly known as: FOLVITE Take 1 mg by mouth daily.   furosemide 40 MG tablet Commonly known as: LASIX Take 40 mg by mouth daily as needed for fluid.   gabapentin 800 MG tablet Commonly  known as: NEURONTIN Take 800 mg by mouth 3 (three) times daily.   lidocaine-prilocaine cream Commonly known as: EMLA Apply 1 application topically daily as needed (pain). Mix with Diclofenac   ondansetron 8 MG tablet Commonly known as: ZOFRAN TAKE 1 TABLET BY MOUTH 2 TIMES A DAY   oxyCODONE-acetaminophen 10-325 MG tablet Commonly known as: PERCOCET Take by mouth 4 (four) times daily. Pt takes on a schedule   pantoprazole 40 MG tablet Commonly known as: PROTONIX Take 40 mg by mouth daily.   potassium chloride SA 20 MEQ tablet Commonly known as: KLOR-CON M Take 20 mEq by mouth every evening.   predniSONE 5 MG tablet Commonly known as: DELTASONE Take 1 tablet by mouth daily.   predniSONE 2.5 MG tablet Commonly known as: DELTASONE Take 1 tablet by mouth daily.   ProAir HFA 108 (90 Base) MCG/ACT inhaler Generic drug: albuterol Inhale 2 puffs into the lungs every 4 (four) hours as needed for wheezing or shortness of breath.   promethazine 12.5 MG tablet  Commonly known as: PHENERGAN Take 6.25 mg by mouth 2 (two) times daily as needed for nausea or vomiting.   Soothe XP Soln Place 1 drop into both eyes 3 (three) times daily as needed (dry eyes).   Stelara 90 MG/ML Sosy injection Generic drug: ustekinumab Inject 90 mg into the skin every 30 (thirty) days. Last dose 05/27/20.   vancomycin 125 MG capsule Commonly known as: VANCOCIN Take 1 capsule (125 mg total) by mouth 4 (four) times daily for 8 days.   VITAMIN B 12 PO Take 1 tablet by mouth daily.   vitamin C 1000 MG tablet Take 1,000 mg by mouth daily.   Vitamin D3 125 MCG (5000 UT) Tabs Take 5,000 Units by mouth every morning.   Xarelto 10 MG Tabs tablet Generic drug: rivaroxaban TAKE 1 TABLET BY MOUTH EVERY DAY WITH SUPPER What changed: See the new instructions.   zinc gluconate 50 MG tablet Take 50 mg by mouth daily.   ZTlido 1.8 % Ptch Generic drug: Lidocaine Apply 1 patch topically daily as needed for  pain.               Durable Medical Equipment  (From admission, onward)           Start     Ordered   07/16/21 1116  For home use only DME oxygen  Once       Question Answer Comment  Length of Need 6 Months   Mode or (Route) Nasal cannula   Oxygen delivery system Gas      07/16/21 1116            Discharge Instructions: Please refer to Patient Instructions section of EMR for full details.  Patient was counseled important signs and symptoms that should prompt return to medical care, changes in medications, dietary instructions, activity restrictions, and follow up appointments.   Follow-Up Appointments: Follow up with PCP    Sharion Settler, DO 07/16/2021, 8:27 PM PGY-2, [place]

## 2021-07-16 NOTE — Progress Notes (Signed)
Patient left AMA, patient has said she plans to leave and teaching service was notified at 13:35. Due to oxygen requirements MD did not feel the patient was appropriate to discharge. Discussed risk of leaving medical care with the patient and her son at the bedside. The patient was requiring 3 L O2 [place], and does not have oxygen at home.  Her reasons for wanting to leave included not being able to sleep well while at the hospital and wanting to get home to her dogs.   IV removed, AMA paper work signed and placed in chart.

## 2021-07-16 NOTE — Progress Notes (Signed)
SATURATION QUALIFICATIONS: (This note is used to comply with regulatory documentation for home oxygen)  Patient Saturations on Room Air at Rest = 85%  Patient Saturations on Room Air while Ambulating = NA due to desaturated <88% at rest  Patient Saturations on 2 Liters of oxygen while Ambulating = 90%  Please briefly explain why patient needs home oxygen:  Patient requires oxygen to maintain O2 saturation >87% during functional mobility.   Arby Barrette, PT Acute Rehabilitation Services  Pager [phone] Office [phone]

## 2021-07-16 NOTE — Evaluation (Signed)
Physical Therapy Evaluation and Discharge Patient Details Name: Kari Hahn MRN: [id number] DOB: 05-02-1971 Today's Date: 07/16/2021  History of Present Illness  Pt is a 51 y/o female presenting on 1/19 with generalized weakness, diarrhea, and vomiting.  Found to be C. difficile positive. Desaturating; V/Q scan negative for PE.  PATIENT REPORTS h/o fall 11/3 with fractures to left humerus and left lower leg. Reports WBAT for LLE transfers and short distance ambulation. Stoppeud using UE sling due to neck pain. Was to have MRI of LUE 07/15/21 but ended up hospitalized.  PMH includes: chrohn's disease, HTN, CKD, PE, DVT on xarelto.  Clinical Impression   Patient evaluated by Physical Therapy with no further acute PT needs identified. All education has been completed and the patient has no further questions. Patient has been primarily modified independent at wheelchair level at home since her fall 04/28/21. She is at her current baseline for transfers and gait with no further PT needs. PT is signing off. Thank you for this referral.        Recommendations for follow up therapy are one component of a multi-disciplinary discharge planning process, led by the attending physician.  Recommendations may be updated based on patient status, additional functional criteria and insurance authorization.  Follow Up Recommendations No PT follow up    Assistance Recommended at Discharge PRN  Patient can return home with the following  Assistance with cooking/housework;Help with stairs or ramp for entrance    Equipment Recommendations None recommended by PT  Recommendations for Other Services       Functional Status Assessment Patient has had a recent decline in their functional status and demonstrates the ability to make significant improvements in function in a reasonable and predictable amount of time.     Precautions / Restrictions Precautions Precautions: Fall;Other (comment) Precaution Comments:  c-diff; fell in her garden 11/3 due to knee "bending backwards" Restrictions Weight Bearing Restrictions: Yes LLE Weight Bearing: Weight bearing as tolerated (for transfers and short distance ambulation) Other Position/Activity Restrictions: supposed to use sling LUE but has not been using      Mobility  Bed Mobility Overal bed mobility: Needs Assistance Bed Mobility: Sidelying to Sit, Sit to Sidelying   Sidelying to sit: Min assist     Sit to sidelying: Modified independent (Device/Increase time) General bed mobility comments: assist to raise torso; pt using LUE in weightbearing position and reports OK to do,, but then reports having issue with healing and due to have an MRI of humerus    Transfers Overall transfer level: Independent Equipment used: None               General transfer comment: sit to stand; step-pivot to/from [facility] and bed    Ambulation/Gait               General Gait Details: pt deferred due to near constant diarrhea and "not wanting it to run down my leg"; mostly uses wheelchair at home  Stairs            Wheelchair Mobility    Modified Rankin (Stroke Patients Only)       Balance Overall balance assessment: History of Falls                                           Pertinent Vitals/Pain Pain Assessment Pain Assessment: Faces Faces Pain Scale: Hurts a little bit  Pain Location: LUE Pain Descriptors / Indicators: Aching Pain Intervention(s): Limited activity within patient's tolerance    Home Living Family/patient expects to be discharged to:: Private residence Living Arrangements: Children Available Help at Discharge: Family;Available 24 hours/day Type of Home: House Home Access: Stairs to enter Entrance Stairs-Rails: None Entrance Stairs-Number of Steps: 2   Home Layout: One level Home Equipment: Conservation officer, nature (2 wheels);Rollator (4 wheels);Shower seat;Wheelchair - manual;BSC/3in1      Prior  Function Prior Level of Function : Needs assist;History of Falls (last six months)       Physical Assist : Mobility (physical);ADLs (physical) Mobility (physical): Bed mobility;Transfers;Stairs ADLs (physical): Bathing;Toileting Mobility Comments: son has been bumping her up/down steps to enter/exit home for appointments; transfers to/from wheelchair independently; walks independently short distances; either self-propels or son pushes her in w/c ADLs Comments: family provides supervision when getting in/out of tub for shower     Hand Dominance   Dominant Hand: Right    Extremity/Trunk Assessment   Upper Extremity Assessment Upper Extremity Assessment:  (RUE WFL; Lt shoulder deferred due to reported fractures)    Lower Extremity Assessment Lower Extremity Assessment: Generalized weakness    Cervical / Trunk Assessment Cervical / Trunk Assessment: Normal  Communication   Communication: No difficulties  Cognition Arousal/Alertness: Awake/alert Behavior During Therapy: WFL for tasks assessed/performed Overall Cognitive Status: Within Functional Limits for tasks assessed                                          General Comments General comments (skin integrity, edema, etc.): see ambulatory saturation note    Exercises     Assessment/Plan    PT Assessment Patient does not need any further PT services  PT Problem List Decreased activity tolerance;Decreased balance       PT Treatment Interventions      PT Goals (Current goals can be found in the Care Plan section)  Acute Rehab PT Goals Patient Stated Goal: get better PT Goal Formulation: All assessment and education complete, DC therapy    Frequency       Co-evaluation               AM-PAC PT "6 Clicks" Mobility  Outcome Measure Help needed turning from your back to your side while in a flat bed without using bedrails?: None Help needed moving from lying on your back to sitting on the  side of a flat bed without using bedrails?: A Little Help needed moving to and from a bed to a chair (including a wheelchair)?: None Help needed standing up from a chair using your arms (e.g., wheelchair or bedside chair)?: None Help needed to walk in hospital room?: None Help needed climbing 3-5 steps with a railing? : Total 6 Click Score: 20    End of Session Equipment Utilized During Treatment: Oxygen Activity Tolerance: Treatment limited secondary to medical complications (Comment) (oxygen desaturation without O2) Patient left: in bed;with call bell/phone within reach Nurse Communication: Mobility status;Other (comment) (o2 needs) PT Visit Diagnosis: History of falling (Z91.81);Difficulty in walking, not elsewhere classified (R26.2)    Time: 1448-1856 PT Time Calculation (min) (ACUTE ONLY): 27 min   Charges:   PT Evaluation $PT Eval Moderate Complexity: [place], PT Acute Rehabilitation Services  Pager [phone] Office [phone]   Rexanne Mano 07/16/2021, 9:25 AM

## 2021-07-16 NOTE — Progress Notes (Addendum)
Occupational Therapy Evaluation Patient Details Name: Kari Hahn MRN: [id number] DOB: 14-Sep-1970 Today's Date: 07/16/2021   History of Present Illness Pt is a 51 y/o female presenting on 1/19 with generalized weakness, diarrhea, and vomiting.  Found to be C. difficile positive. Desaturating; V/Q scan negative for PE.  PATIENT REPORTS h/o fall 11/3 with fractures to left humerus and left lower leg. Reports WBAT for LLE transfers and short distance ambulation. Stoppeud using UE sling due to neck pain. Was to have MRI of LUE 07/15/21 but ended up hospitalized.  PMH includes: chrohn's disease, HTN, CKD, PE, DVT on xarelto.   Clinical Impression   Pt presents with above diagnosis. PTA pt PLOF living at home with family, reports being I to mod I with ADLs with use of AE as needed.  Pt is currently limited with safe ADL engagement due to pain, impaired strength, decreased LUE function, safety awareness, and decreased functional mobility. Pt will benefit to continued skilled level OT in acute level for education of AE, compensatory strategies, safety awareness, HEP, and functional transfer to maximize independence prior to DC.  Pt received on 2L O2 with saturation range of 88-91%. No OT follow up at this time but would recommend Outpatient OT once MRI is complete and LUE concerns are addressed. Will further assess with progression.      Recommendations for follow up therapy are one component of a multi-disciplinary discharge planning process, led by the attending physician.  Recommendations may be updated based on patient status, additional functional criteria and insurance authorization.   Follow Up Recommendations  No OT follow up (but would consider outpatient OT once LUE fx is addressed.)    Assistance Recommended at Discharge PRN  Patient can return home with the following Assistance with cooking/housework    Functional Status Assessment  Patient has had a recent decline in their functional  status and demonstrates the ability to make significant improvements in function in a reasonable and predictable amount of time.  Equipment Recommendations       Recommendations for Other Services       Precautions / Restrictions Precautions Precautions: Fall;Other (comment) Precaution Comments: c-diff; fell in her garden 11/3 due to knee "bending backwards" Restrictions Weight Bearing Restrictions: Yes LUE Weight Bearing:  (No clarity in chart for LUE restrictions, treating at [facility]. Sling not availble in room) LLE Weight Bearing: Weight bearing as tolerated (for transfers and short distance ambulation) Other Position/Activity Restrictions: supposed to use sling LUE but has not been using      Mobility Bed Mobility               General bed mobility comments: Per PT (supine to sit) pt using LUE in weightbearing position and reports OK to do,, but then reports having issue with healing and due to have an MRI of humerus, pt demonstrated mod I sit to sidelying, no physical assistance needed.    Transfers   Equipment used: 1 person hand held assist               General transfer comment: standpivot from [facility] to bed.      Balance                                           ADL either performed or assessed with clinical judgement   ADL Overall ADL's : Needs assistance/impaired   Eating/Feeding Details (  indicate cue type and reason): Not formally assessed but will infer independent with set up   Grooming Details (indicate cue type and reason): Not formally assessed but will infer independent with set up         Upper Body Dressing : Minimal assistance Upper Body Dressing Details (indicate cue type and reason): Pt demonstrated donning of gown while seated EOB, educated of compensatory strategy due to LUE deficits from prior injury. Lower Body Dressing: Min guard Lower Body Dressing Details (indicate cue type and reason): Pt demonstrated doff/donn of  socks with figure 4 position. Toilet Transfer: Minimal assistance;Cueing for safety;Cueing for sequencing Toilet Transfer Details (indicate cue type and reason): HHA+1 from [facility] to bed. Toileting- Clothing Manipulation and Hygiene: Maximal assistance Toileting - Clothing Manipulation Details (indicate cue type and reason): Pt required assistance for pericare.             Vision Baseline Vision/History:  (Reports L eye low vision, limited driving)       Perception     Praxis      Pertinent Vitals/Pain Pain Assessment Pain Assessment: 0-10 Pain Score: 7      Hand Dominance Right   Extremity/Trunk Assessment Upper Extremity Assessment Upper Extremity Assessment: LUE deficits/detail (RUE WFL; Lt shoulder AROM/PROM not tested due to reported fractures.) LUE Deficits / Details: Prior LUE fx per communication with PT and pt. (assessed elbow and distally. elbow, wrist and digits WFL AROM.)   Lower Extremity Assessment Lower Extremity Assessment: Generalized weakness   Cervical / Trunk Assessment Cervical / Trunk Assessment: Normal   Communication Communication Communication: No difficulties                 Home Living Family/patient expects to be discharged to:: Private residence Living Arrangements: Children Available Help at Discharge: Family;Available 24 hours/day Type of Home: House Home Access: Stairs to enter [employer] of Steps: 2 Entrance Stairs-Rails: None Home Layout: One level     Bathroom Shower/Tub: Teacher, early years/pre: Standard Bathroom Accessibility: No   Home Equipment: Conservation officer, nature (2 wheels);Rollator (4 wheels);Shower seat;Wheelchair - manual;BSC/3in1          Prior Functioning/Environment Prior Level of Function : Needs assist;History of Falls (last six months)       Physical Assist : Mobility (physical);ADLs (physical) Mobility (physical): Bed mobility;Transfers;Stairs ADLs (physical):  Bathing;Toileting Mobility Comments: son has been bumping her up/down steps to enter/exit home for appointments; transfers to/from wheelchair independently; walks independently short distances; either self-propels or son pushes her in w/c ADLs Comments: family provides supervision when getting in/out of tub for shower        OT Problem List: Decreased strength;Decreased range of motion;Impaired balance (sitting and/or standing);Decreased safety awareness;Decreased knowledge of use of DME or AE;Decreased knowledge of precautions;Pain;Impaired UE functional use      OT Treatment/Interventions: Self-care/ADL training;Therapeutic exercise;DME and/or AE instruction;Manual therapy;Modalities;Therapeutic activities;Patient/family education;Balance training    OT Goals(Current goals can be found in the care plan section) Acute Rehab OT Goals Patient Stated Goal: No goal stated OT Goal Formulation: With patient Time For Goal Achievement: 07/30/21 Potential to Achieve Goals: Fair  OT Frequency: Min 2X/week    Co-evaluation              AM-PAC OT "6 Clicks" Daily Activity     Outcome Measure Help from another person eating meals?: A Little Help from another person taking care of personal grooming?: A Little Help from another person toileting, which includes using toliet, bedpan, or  urinal?: A Lot Help from another person bathing (including washing, rinsing, drying)?: A Lot Help from another person to put on and taking off regular upper body clothing?: A Little Help from another person to put on and taking off regular lower body clothing?: A Little 6 Click Score: 16   End of Session Equipment Utilized During Treatment: Gait belt;Oxygen Nurse Communication: Mobility status  Activity Tolerance: Patient tolerated treatment well (concern of bowel incontinence) Patient left: in bed;with call bell/phone within reach;with bed alarm set  OT Visit Diagnosis: Unsteadiness on feet  (R26.81);Repeated falls (R29.6);Muscle weakness (generalized) (M62.81);Pain Pain - Right/Left: Left Pain - part of body: Arm;Shoulder                Time: 4239-5320 OT Time Calculation (min): 19 min Charges:  OT General Charges $OT Visit: 1 Visit OT Evaluation $OT Eval Moderate Complexity: [place], MSOT, OTR/L  Supplemental Rehabilitation Services  [phone]   Marius Ditch 07/16/2021, 11:08 AM

## 2021-07-19 LAB — CULTURE, BLOOD (ROUTINE X 2): Culture: NO GROWTH

## 2021-07-22 ENCOUNTER — Telehealth: Payer: Self-pay | Admitting: *Deleted

## 2021-07-22 NOTE — Telephone Encounter (Signed)
Call received from patient stating that she was discharged from the hospital yesterday, 07/21/21 and was placed on Xarelto 20 mg daily at time of discharge.  She states that she was previously on Xarelto 10 mg daily and would like to know what dose Dr. Marin Olp would like her to take.  Dr. Marin Olp notified. Call placed back to patient and patient notified per order of Dr. Marin Olp to take Xarelto 10 mg daily.  Teach back done.  Pt appreciative of call back and has no further questions at this time.

## 2021-08-19 ENCOUNTER — Telehealth: Payer: Self-pay | Admitting: *Deleted

## 2021-08-19 ENCOUNTER — Other Ambulatory Visit: Payer: Self-pay | Admitting: *Deleted

## 2021-08-19 DIAGNOSIS — D500 Iron deficiency anemia secondary to blood loss (chronic): Secondary | ICD-10-CM

## 2021-08-19 DIAGNOSIS — I82431 Acute embolism and thrombosis of right popliteal vein: Secondary | ICD-10-CM

## 2021-08-19 DIAGNOSIS — D509 Iron deficiency anemia, unspecified: Secondary | ICD-10-CM

## 2021-08-19 NOTE — Telephone Encounter (Signed)
Message received from patient stating that she was admitted from 07/14/21-07/21/21 with CHF/Pneumonia and C-Diff and would like to know if she needs to come in any sooner than her scheduled appt on 10/28/21.  Vale [place] NP notified.  Call placed back to patient and patient notified per order of S. Eulas Post NP that she would like pt to come in next week for labs only.  Pt appreciative of assistance and has no questions or concerns at this time.  Message sent to scheduling.

## 2021-08-23 ENCOUNTER — Inpatient Hospital Stay: Payer: Medicaid Other | Attending: Family

## 2021-08-23 ENCOUNTER — Other Ambulatory Visit: Payer: Self-pay

## 2021-08-23 DIAGNOSIS — D509 Iron deficiency anemia, unspecified: Secondary | ICD-10-CM | POA: Insufficient documentation

## 2021-08-23 DIAGNOSIS — Z86718 Personal history of other venous thrombosis and embolism: Secondary | ICD-10-CM | POA: Insufficient documentation

## 2021-08-23 DIAGNOSIS — I82431 Acute embolism and thrombosis of right popliteal vein: Secondary | ICD-10-CM

## 2021-08-23 DIAGNOSIS — D500 Iron deficiency anemia secondary to blood loss (chronic): Secondary | ICD-10-CM

## 2021-08-23 LAB — CBC WITH DIFFERENTIAL (CANCER CENTER ONLY)
Abs Immature Granulocytes: 0.15 10*3/uL — ABNORMAL HIGH (ref 0.00–0.07)
Basophils Absolute: 0.1 10*3/uL (ref 0.0–0.1)
Basophils Relative: 1 %
Eosinophils Absolute: 0.2 10*3/uL (ref 0.0–0.5)
Eosinophils Relative: 2 %
HCT: 45.4 % (ref 36.0–46.0)
Hemoglobin: 15.1 g/dL — ABNORMAL HIGH (ref 12.0–15.0)
Immature Granulocytes: 1 %
Lymphocytes Relative: 18 %
Lymphs Abs: 2.6 10*3/uL (ref 0.7–4.0)
MCH: 35.2 pg — ABNORMAL HIGH (ref 26.0–34.0)
MCHC: 33.3 g/dL (ref 30.0–36.0)
MCV: 105.8 fL — ABNORMAL HIGH (ref 80.0–100.0)
Monocytes Absolute: 0.8 10*3/uL (ref 0.1–1.0)
Monocytes Relative: 6 %
Neutro Abs: 10.4 10*3/uL — ABNORMAL HIGH (ref 1.7–7.7)
Neutrophils Relative %: 72 %
Platelet Count: 406 10*3/uL — ABNORMAL HIGH (ref 150–400)
RBC: 4.29 MIL/uL (ref 3.87–5.11)
RDW: 14.5 % (ref 11.5–15.5)
WBC Count: 14.3 10*3/uL — ABNORMAL HIGH (ref 4.0–10.5)
nRBC: 0 % (ref 0.0–0.2)

## 2021-08-23 LAB — CMP (CANCER CENTER ONLY)
ALT: 12 U/L (ref 0–44)
AST: 11 U/L — ABNORMAL LOW (ref 15–41)
Albumin: 3.9 g/dL (ref 3.5–5.0)
Alkaline Phosphatase: 111 U/L (ref 38–126)
Anion gap: 7 (ref 5–15)
BUN: 16 mg/dL (ref 6–20)
CO2: 33 mmol/L — ABNORMAL HIGH (ref 22–32)
Calcium: 10.2 mg/dL (ref 8.9–10.3)
Chloride: 101 mmol/L (ref 98–111)
Creatinine: 1.55 mg/dL — ABNORMAL HIGH (ref 0.44–1.00)
GFR, Estimated: 40 mL/min — ABNORMAL LOW (ref >60)
Glucose, Bld: 97 mg/dL (ref 70–99)
Potassium: 4.3 mmol/L (ref 3.5–5.1)
Sodium: 141 mmol/L (ref 135–145)
Total Bilirubin: 0.3 mg/dL (ref 0.3–1.2)
Total Protein: 6.7 g/dL (ref 6.5–8.1)

## 2021-08-23 LAB — FERRITIN: Ferritin: 235 ng/mL (ref 11–307)

## 2021-08-23 LAB — IRON AND IRON BINDING CAPACITY (CC-WL,HP ONLY)
Iron: 91 ug/dL (ref 28–170)
Saturation Ratios: 27 % (ref 10.4–31.8)
TIBC: 343 ug/dL (ref 250–450)
UIBC: 252 ug/dL (ref 148–442)

## 2021-10-28 ENCOUNTER — Ambulatory Visit: Payer: Medicaid Other | Admitting: Family

## 2021-10-28 ENCOUNTER — Other Ambulatory Visit: Payer: Medicaid Other

## 2021-10-31 ENCOUNTER — Other Ambulatory Visit: Payer: Self-pay | Admitting: *Deleted

## 2021-10-31 DIAGNOSIS — K50919 Crohn's disease, unspecified, with unspecified complications: Secondary | ICD-10-CM

## 2021-10-31 MED ORDER — ONDANSETRON HCL 8 MG PO TABS: ORAL_TABLET | ORAL | 2 refills | Status: AC

## 2021-11-08 ENCOUNTER — Inpatient Hospital Stay (HOSPITAL_BASED_OUTPATIENT_CLINIC_OR_DEPARTMENT_OTHER): Payer: Medicaid Other | Admitting: Family

## 2021-11-08 ENCOUNTER — Inpatient Hospital Stay: Payer: Medicaid Other | Attending: Family

## 2021-11-08 ENCOUNTER — Encounter: Payer: Self-pay | Admitting: Family

## 2021-11-08 VITALS — BP 161/90 | HR 99 | Temp 98.5°F | Resp 18 | Wt 171.0 lb

## 2021-11-08 DIAGNOSIS — D500 Iron deficiency anemia secondary to blood loss (chronic): Secondary | ICD-10-CM | POA: Diagnosis not present

## 2021-11-08 DIAGNOSIS — Z79899 Other long term (current) drug therapy: Secondary | ICD-10-CM | POA: Diagnosis not present

## 2021-11-08 DIAGNOSIS — I82431 Acute embolism and thrombosis of right popliteal vein: Secondary | ICD-10-CM | POA: Diagnosis not present

## 2021-11-08 DIAGNOSIS — D510 Vitamin B12 deficiency anemia due to intrinsic factor deficiency: Secondary | ICD-10-CM | POA: Diagnosis not present

## 2021-11-08 DIAGNOSIS — D6862 Lupus anticoagulant syndrome: Secondary | ICD-10-CM | POA: Diagnosis not present

## 2021-11-08 DIAGNOSIS — F17210 Nicotine dependence, cigarettes, uncomplicated: Secondary | ICD-10-CM | POA: Insufficient documentation

## 2021-11-08 DIAGNOSIS — D509 Iron deficiency anemia, unspecified: Secondary | ICD-10-CM | POA: Insufficient documentation

## 2021-11-08 DIAGNOSIS — Z7901 Long term (current) use of anticoagulants: Secondary | ICD-10-CM | POA: Insufficient documentation

## 2021-11-08 LAB — RETICULOCYTES
Immature Retic Fract: 13.8 % (ref 2.3–15.9)
RBC.: 4.86 MIL/uL (ref 3.87–5.11)
Retic Count, Absolute: 107.9 10*3/uL (ref 19.0–186.0)
Retic Ct Pct: 2.2 % (ref 0.4–3.1)

## 2021-11-08 LAB — CBC WITH DIFFERENTIAL (CANCER CENTER ONLY)
Abs Immature Granulocytes: 0.56 10*3/uL — ABNORMAL HIGH (ref 0.00–0.07)
Basophils Absolute: 0.1 10*3/uL (ref 0.0–0.1)
Basophils Relative: 0 %
Eosinophils Absolute: 0.3 10*3/uL (ref 0.0–0.5)
Eosinophils Relative: 1 %
HCT: 51.1 % — ABNORMAL HIGH (ref 36.0–46.0)
Hemoglobin: 17.2 g/dL — ABNORMAL HIGH (ref 12.0–15.0)
Immature Granulocytes: 3 %
Lymphocytes Relative: 8 %
Lymphs Abs: 1.4 10*3/uL (ref 0.7–4.0)
MCH: 35.2 pg — ABNORMAL HIGH (ref 26.0–34.0)
MCHC: 33.7 g/dL (ref 30.0–36.0)
MCV: 104.7 fL — ABNORMAL HIGH (ref 80.0–100.0)
Monocytes Absolute: 1.3 10*3/uL — ABNORMAL HIGH (ref 0.1–1.0)
Monocytes Relative: 7 %
Neutro Abs: 13.9 10*3/uL — ABNORMAL HIGH (ref 1.7–7.7)
Neutrophils Relative %: 81 %
Platelet Count: 359 10*3/uL (ref 150–400)
RBC: 4.88 MIL/uL (ref 3.87–5.11)
RDW: 14.3 % (ref 11.5–15.5)
WBC Count: 17.4 10*3/uL — ABNORMAL HIGH (ref 4.0–10.5)
nRBC: 0 % (ref 0.0–0.2)

## 2021-11-08 LAB — IRON AND IRON BINDING CAPACITY (CC-WL,HP ONLY)
Iron: 123 ug/dL (ref 28–170)
Saturation Ratios: 26 % (ref 10.4–31.8)
TIBC: 480 ug/dL — ABNORMAL HIGH (ref 250–450)
UIBC: 357 ug/dL (ref 148–442)

## 2021-11-08 LAB — FERRITIN: Ferritin: 89 ng/mL (ref 11–307)

## 2021-11-08 NOTE — Progress Notes (Signed)
?Hematology and Oncology Follow Up Visit ? ?Kari Hahn ?[id number] ?[birth year] 51 y.o. ?11/08/2021 ? ? 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  ?Current Therapy:        ?Xarelto 20 mg PO daily  ?Vitamin B12 PO BID daily ?IV iron as needed ?  ?Interim History:  Kari Hahn is here today for follow-up. She is going through a rough time right now. She is separating from her husband of over 25 years. Thankfully she has a wonderful family to support she and her son.  ?The stress has caused a Crohn's flare. She has had some blood in her stool at times.  ?No other blood loss noted. No bruising or petechiae.  ?No fever, chills, n/v, cough, rash, dizziness, chest pain, palpitations, abdominal pain or changes in bladder habits.  ?She has SOB with exertion. She is back to smoking 2 ppd.  ?No swelling, tenderness, numbness or tingling in her extremities at this time.  ?No falls or syncope reported.  ?Appetite and hydration are good. Her weight is stable at 171 lbs.  ? ?ECOG Performance Status: 1 - Symptomatic but completely ambulatory ? ?Medications:  ?Allergies as of 11/08/2021   ?No Known Allergies ?  ? ?  ?Medication List  ?  ? ?  ? Accurate as of Nov 08, 2021  8:59 AM. If you have any questions, ask your nurse or doctor.  ?  ?  ? ?  ? ?amLODipine-valsartan 5-320 MG tablet ?Commonly known as: EXFORGE ?Take 1 tablet by mouth daily. ?  ?Blink Tears 0.25 % Gel ?Generic drug: Polyethylene Glycol 400 ?Place 1 drop into both eyes at bedtime. ?  ?cyclobenzaprine 10 MG tablet ?Commonly known as: FLEXERIL ?Take 10 mg by mouth 2 (two) times daily as needed for muscle spasms. ?  ?desmopressin 0.2 MG tablet ?Commonly known as: DDAVP ?Take 0.2-0.4 mg by mouth at bedtime. ?  ?Diclofenac Sodium 3 % Gel ?Apply 1 application topically daily as needed  (pain). Mix with Lidocaine ?  ?escitalopram 20 MG tablet ?Commonly known as: LEXAPRO ?Take 40 mg by mouth at bedtime. 2  20 mg tab = 40 mg daily ?  ?ezetimibe 10 MG tablet ?Commonly known as: ZETIA ?Take 10 mg by mouth daily. ?  ?fluticasone 50 MCG/ACT nasal spray ?Commonly known as: FLONASE ?Place 1 spray into both nostrils daily. ?  ?folic acid 1 MG tablet ?Commonly known as: FOLVITE ?Take 1 mg by mouth daily. ?  ?furosemide 40 MG tablet ?Commonly known as: LASIX ?Take 40 mg by mouth daily as needed for fluid. ?  ?gabapentin 800 MG tablet ?Commonly known as: NEURONTIN ?Take 800 mg by mouth 3 (three) times daily. ?  ?lidocaine-prilocaine cream ?Commonly known as: EMLA ?Apply 1 application topically daily as needed (pain). Mix with Diclofenac ?  ?ondansetron 8 MG tablet ?Commonly known as: ZOFRAN ?Take one tablet by mouth two times daily. ?  ?oxyCODONE-acetaminophen 10-325 MG tablet ?Commonly known as: PERCOCET ?Take by mouth 4 (four) times daily. Pt takes on a schedule ?  ?pantoprazole 40 MG tablet ?Commonly known as: PROTONIX ?Take 40 mg by mouth daily. ?  ?potassium chloride SA 20 MEQ tablet ?Commonly known as: KLOR-CON M ?Take 20 mEq by mouth every evening. ?  ?predniSONE 5 MG tablet ?Commonly known as: DELTASONE ?Take 1 tablet by mouth daily. ?  ?predniSONE 2.5 MG tablet ?Commonly  known as: DELTASONE ?Take 1 tablet by mouth daily. ?  ?ProAir HFA 108 (90 Base) MCG/ACT inhaler ?Generic drug: albuterol ?Inhale 2 puffs into the lungs every 4 (four) hours as needed for wheezing or shortness of breath. ?  ?promethazine 12.5 MG tablet ?Commonly known as: PHENERGAN ?Take 6.25 mg by mouth 2 (two) times daily as needed for nausea or vomiting. ?  ?Soothe XP Soln ?Place 1 drop into both eyes 3 (three) times daily as needed (dry eyes). ?  ?Stelara 90 MG/ML Sosy injection ?Generic drug: ustekinumab ?Inject 90 mg into the skin every 30 (thirty) days. Last dose 05/27/20. ?  ?VITAMIN B 12 PO ?Take 1 tablet by mouth daily. ?   ?vitamin C 1000 MG tablet ?Take 1,000 mg by mouth daily. ?  ?Vitamin D3 125 MCG (5000 UT) Tabs ?Take 5,000 Units by mouth every morning. ?  ?Xarelto 10 MG Tabs tablet ?Generic drug: rivaroxaban ?TAKE 1 TABLET BY MOUTH EVERY DAY WITH SUPPER ?What changed: See the new instructions. ?  ?zinc gluconate 50 MG tablet ?Take 50 mg by mouth daily. ?  ?ZTlido 1.8 % Ptch ?Generic drug: Lidocaine ?Apply 1 patch topically daily as needed for pain. ?  ? ?  ? ? ?Allergies: No Known Allergies ? ?Past Medical History, Surgical history, Social history, and Family History were reviewed and updated. ? ?Review of Systems: ?All other 10 point review of systems is negative.  ? ?Physical Exam: ? vitals were not taken for this visit.  ? ?Wt Readings from Last 3 Encounters:  ?07/14/21 175 lb (79.4 kg)  ?07/01/21 162 lb 1.6 oz (73.5 kg)  ?03/30/21 159 lb 1.9 oz (72.2 kg)  ? ? ?Ocular: Sclerae unicteric, pupils equal, round and reactive to light ?Ear-nose-throat: Oropharynx clear, dentition fair ?Lymphatic: No cervical or supraclavicular adenopathy ?Lungs no rales or rhonchi, good excursion bilaterally ?Heart regular rate and rhythm, no murmur appreciated ?Abd soft, nontender, positive bowel sounds ?MSK no focal spinal tenderness, no joint edema ?Neuro: non-focal, well-oriented, appropriate affect ?Breasts: Deferred  ? ?Lab Results  ?Component Value Date  ? WBC 17.4 (H) 11/08/2021  ? HGB 17.2 (H) 11/08/2021  ? HCT 51.1 (H) 11/08/2021  ? MCV 104.7 (H) 11/08/2021  ? PLT 359 11/08/2021  ? ?Lab Results  ?Component Value Date  ? FERRITIN 235 08/23/2021  ? IRON 91 08/23/2021  ? TIBC 343 08/23/2021  ? UIBC 252 08/23/2021  ? IRONPCTSAT 27 08/23/2021  ? ?Lab Results  ?Component Value Date  ? RETICCTPCT 2.2 11/08/2021  ? RBC 4.88 11/08/2021  ? RETICCTABS 88.8 12/21/2014  ? ?No results Hahn for: KPAFRELGTCHN, LAMBDASER, KAPLAMBRATIO ?No results Hahn for: IGGSERUM, IGA, IGMSERUM ?No results Hahn for: TOTALPROTELP, ALBUMINELP, A1GS, A2GS, BETS,  BETA2SER, GAMS, MSPIKE, SPEI ?  Chemistry   ?   ?Component Value Date/Time  ? NA 141 08/23/2021 0819  ? NA 141 03/12/2017 0929  ? K 4.3 08/23/2021 0819  ? K 2.8 (LL) 03/12/2017 0929  ? CL 101 08/23/2021 0819  ? CL 106 12/08/2011 1145  ? CO2 33 (H) 08/23/2021 0819  ? CO2 27 03/12/2017 0929  ? BUN 16 08/23/2021 0819  ? BUN 9.4 03/12/2017 0929  ? CREATININE 1.55 (H) 08/23/2021 8453  ? CREATININE 1.2 (H) 03/12/2017 0929  ?    ?Component Value Date/Time  ? CALCIUM 10.2 08/23/2021 0819  ? CALCIUM 9.5 03/12/2017 0929  ? ALKPHOS 111 08/23/2021 0819  ? ALKPHOS 125 03/12/2017 0929  ? AST 11 (L) 08/23/2021 0819  ? AST 13 03/12/2017  0929  ? ALT 12 08/23/2021 0819  ? ALT 9 03/12/2017 0929  ? BILITOT 0.3 08/23/2021 0819  ? BILITOT <0.22 03/12/2017 9842  ?  ? ? ? ?Impression and Plan: Kari Hahn is a very pleasant 51 yo caucasian female with iron deficiency anemia due to GI blood loss with Crohn's as well as history of thrombotic disease on anticoagulation. ?Iron studies are pending.  ?Follow-up in 4 months.  ? ?Lottie Dawson, NP ?5/16/20238:59 AM ? ?

## 2021-11-10 ENCOUNTER — Telehealth: Payer: Self-pay | Admitting: *Deleted

## 2021-11-10 NOTE — Telephone Encounter (Signed)
Per 11/10/21 los - called and gave upcoming appointments

## 2021-11-14 ENCOUNTER — Other Ambulatory Visit: Payer: Self-pay | Admitting: Hematology & Oncology

## 2021-11-14 DIAGNOSIS — I749 Embolism and thrombosis of unspecified artery: Secondary | ICD-10-CM

## 2022-01-17 ENCOUNTER — Telehealth: Payer: Self-pay

## 2022-01-17 NOTE — Telephone Encounter (Signed)
Patient called to inform us she is off xarelto currently, she has had diarrhea since Sunday and stopped her xarelto Sunday evening and went to the ED yesterday per her GI dr. Per ED they told her to continue holding xarelto until after her colonoscopy which is scheduled Friday 7/28. Pt just wanted to let us know and ask when she needed to restart her xarelto.

## 2022-01-18 NOTE — Telephone Encounter (Signed)
Per Dr Marin Olp ok to restart Xarelto the day after colonoscopy. LMTCB- unable to leave a detailed message.

## 2022-01-19 ENCOUNTER — Telehealth: Payer: Self-pay | Admitting: *Deleted

## 2022-01-19 NOTE — Telephone Encounter (Signed)
Call back received from patient and pt notified per order of  Dr Marin Olp that it is ok to restart Xarelto the day after colonoscopy if there is no bleeding. Pt is appreciative of information and has no questions at this time.

## 2022-02-08 ENCOUNTER — Encounter: Payer: Self-pay | Admitting: Family

## 2022-03-10 ENCOUNTER — Inpatient Hospital Stay: Payer: Medicaid Other | Admitting: Family

## 2022-03-10 ENCOUNTER — Inpatient Hospital Stay: Payer: Medicaid Other | Attending: Hematology & Oncology

## 2022-05-29 ENCOUNTER — Other Ambulatory Visit: Payer: Self-pay | Admitting: Hematology & Oncology

## 2022-05-29 DIAGNOSIS — I749 Embolism and thrombosis of unspecified artery: Secondary | ICD-10-CM

## 2022-10-09 ENCOUNTER — Inpatient Hospital Stay: Payer: Medicaid Other

## 2022-10-09 ENCOUNTER — Ambulatory Visit: Payer: Medicaid Other | Admitting: Family

## 2022-10-16 ENCOUNTER — Inpatient Hospital Stay: Payer: Medicaid Other | Attending: Hematology & Oncology

## 2022-10-16 ENCOUNTER — Other Ambulatory Visit: Payer: Self-pay

## 2022-10-16 ENCOUNTER — Encounter: Payer: Self-pay | Admitting: Family

## 2022-10-16 ENCOUNTER — Telehealth: Payer: Self-pay | Admitting: *Deleted

## 2022-10-16 ENCOUNTER — Inpatient Hospital Stay (HOSPITAL_BASED_OUTPATIENT_CLINIC_OR_DEPARTMENT_OTHER): Payer: Medicaid Other | Admitting: Family

## 2022-10-16 ENCOUNTER — Other Ambulatory Visit: Payer: Self-pay | Admitting: Family

## 2022-10-16 VITALS — BP 129/73 | HR 94 | Temp 98.9°F | Resp 18 | Ht 62.0 in | Wt 168.0 lb

## 2022-10-16 DIAGNOSIS — Z86718 Personal history of other venous thrombosis and embolism: Secondary | ICD-10-CM | POA: Diagnosis present

## 2022-10-16 DIAGNOSIS — D510 Vitamin B12 deficiency anemia due to intrinsic factor deficiency: Secondary | ICD-10-CM | POA: Insufficient documentation

## 2022-10-16 DIAGNOSIS — D509 Iron deficiency anemia, unspecified: Secondary | ICD-10-CM | POA: Diagnosis not present

## 2022-10-16 DIAGNOSIS — I82431 Acute embolism and thrombosis of right popliteal vein: Secondary | ICD-10-CM | POA: Diagnosis not present

## 2022-10-16 DIAGNOSIS — Z7901 Long term (current) use of anticoagulants: Secondary | ICD-10-CM | POA: Diagnosis not present

## 2022-10-16 DIAGNOSIS — D500 Iron deficiency anemia secondary to blood loss (chronic): Secondary | ICD-10-CM | POA: Diagnosis not present

## 2022-10-16 LAB — RETICULOCYTES
Immature Retic Fract: 25.4 % — ABNORMAL HIGH (ref 2.3–15.9)
RBC.: 4.46 MIL/uL (ref 3.87–5.11)
Retic Count, Absolute: 132.5 10*3/uL (ref 19.0–186.0)
Retic Ct Pct: 3 % (ref 0.4–3.1)

## 2022-10-16 LAB — CBC WITH DIFFERENTIAL (CANCER CENTER ONLY)
Abs Immature Granulocytes: 0.25 10*3/uL — ABNORMAL HIGH (ref 0.00–0.07)
Basophils Absolute: 0.1 10*3/uL (ref 0.0–0.1)
Basophils Relative: 1 %
Eosinophils Absolute: 0.2 10*3/uL (ref 0.0–0.5)
Eosinophils Relative: 2 %
HCT: 41.8 % (ref 36.0–46.0)
Hemoglobin: 13.1 g/dL (ref 12.0–15.0)
Immature Granulocytes: 2 %
Lymphocytes Relative: 14 %
Lymphs Abs: 2 10*3/uL (ref 0.7–4.0)
MCH: 29.7 pg (ref 26.0–34.0)
MCHC: 31.3 g/dL (ref 30.0–36.0)
MCV: 94.8 fL (ref 80.0–100.0)
Monocytes Absolute: 1 10*3/uL (ref 0.1–1.0)
Monocytes Relative: 8 %
Neutro Abs: 10.1 10*3/uL — ABNORMAL HIGH (ref 1.7–7.7)
Neutrophils Relative %: 73 %
Platelet Count: 403 10*3/uL — ABNORMAL HIGH (ref 150–400)
RBC: 4.41 MIL/uL (ref 3.87–5.11)
RDW: 20.8 % — ABNORMAL HIGH (ref 11.5–15.5)
WBC Count: 13.6 10*3/uL — ABNORMAL HIGH (ref 4.0–10.5)
nRBC: 0.1 % (ref 0.0–0.2)

## 2022-10-16 LAB — IRON AND IRON BINDING CAPACITY (CC-WL,HP ONLY)
Iron: 40 ug/dL (ref 28–170)
Saturation Ratios: 8 % — ABNORMAL LOW (ref 10.4–31.8)
TIBC: 518 ug/dL — ABNORMAL HIGH (ref 250–450)
UIBC: 478 ug/dL — ABNORMAL HIGH (ref 148–442)

## 2022-10-16 LAB — FERRITIN: Ferritin: 25 ng/mL (ref 11–307)

## 2022-10-16 NOTE — Progress Notes (Signed)
Hematology and Oncology Follow Up Visit  Kari Hahn [id number] 03/19/1971 52 y.o. 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 She is going through a rough time at home. She has separated from her husband and her young grandson is gravely ill. Her sons have been a great support system for her and she is pushing through.  She notes fatigue, SOB with exertion at times and dizziness.  The neuropathy in her feet causes issues with balance. She did not like using a cane and is looking for a walker.  No falls or syncope reported.  No fever, chills, n/v, cough, rash, chest pain, abdominal pain or changes in bowel or bladder habits.  No blood loss noted. No petechiae.  Appetite comes and goes. Hydration is good. Weight is stable at 168 lbs.   ECOG Performance Status: 1 - Symptomatic but completely ambulatory  Medications:  Allergies as of 10/16/2022   No Known Allergies      Medication List        Accurate as of October 16, 2022  9:38 AM. If you have any questions, ask your nurse or doctor.          STOP taking these medications    cyclobenzaprine 10 MG tablet Commonly known as: FLEXERIL Stopped by: Eileen Stanford, NP   ZTlido 1.8 % Ptch Generic drug: Lidocaine Stopped by: Eileen Stanford, NP       TAKE these medications    Adult Blood Pressure Cuff Lg Kit Call PCP with readings consistently greater than 130/80   amLODipine 10 MG tablet Commonly known as: NORVASC Take by mouth.   amLODipine-valsartan 5-320 MG tablet Commonly known as: EXFORGE Take 1 tablet by mouth daily.   Blink Tears 0.25 % Gel Generic drug:  Polyethylene Glycol 400 Place 1 drop into both eyes at bedtime.   desmopressin 0.2 MG tablet Commonly known as: DDAVP Take 0.2-0.4 mg by mouth at bedtime.   Diclofenac Sodium 3 % Gel Apply 1 application topically daily as needed (pain). Mix with Lidocaine   escitalopram 20 MG tablet Commonly known as: LEXAPRO Take 40 mg by mouth at bedtime. 2  20 mg tab = 40 mg daily   ezetimibe 10 MG tablet Commonly known as: ZETIA Take 10 mg by mouth daily.   fluticasone 50 MCG/ACT nasal spray Commonly known as: FLONASE Place 1 spray into both nostrils daily.   folic acid 1 MG tablet Commonly known as: FOLVITE Take 1 mg by mouth daily.   furosemide 40 MG tablet Commonly known as: LASIX Take 40 mg by mouth daily as needed for fluid.   gabapentin 800 MG tablet Commonly known as: NEURONTIN Take 800 mg by mouth 3 (three) times daily.   lidocaine-prilocaine cream Commonly known as: EMLA Apply 1 application topically daily as needed (pain). Mix with Diclofenac   ondansetron 8 MG tablet Commonly known as: ZOFRAN Take one tablet by mouth two times daily.   oxyCODONE-acetaminophen 10-325 MG tablet Commonly known as: PERCOCET Take by mouth 4 (four) times daily. Pt takes on a schedule   pantoprazole 40 MG tablet Commonly known as: PROTONIX Take 40 mg by  mouth daily.   potassium chloride SA 20 MEQ tablet Commonly known as: KLOR-CON M Take 20 mEq by mouth every evening.   predniSONE 5 MG tablet Commonly known as: DELTASONE Take 1 tablet by mouth daily.   predniSONE 2.5 MG tablet Commonly known as: DELTASONE Take 1 tablet by mouth daily.   ProAir HFA 108 (90 Base) MCG/ACT inhaler Generic drug: albuterol Inhale 2 puffs into the lungs every 4 (four) hours as needed for wheezing or shortness of breath.   promethazine 12.5 MG tablet Commonly known as: PHENERGAN Take 6.25 mg by mouth 2 (two) times daily as needed for nausea or vomiting.   Soothe XP Soln Place 1 drop into both  eyes 3 (three) times daily as needed (dry eyes).   Stelara 90 MG/ML Sosy injection Generic drug: ustekinumab Inject 90 mg into the skin every 30 (thirty) days. Last dose 05/27/20.   VITAMIN B 12 PO Take 1 tablet by mouth daily.   vitamin C 1000 MG tablet Take 1,000 mg by mouth daily.   Vitamin D3 125 MCG (5000 UT) Tabs Take 5,000 Units by mouth every morning.   Xarelto 10 MG Tabs tablet Generic drug: rivaroxaban TAKE 1 TABLET BY MOUTH EVERY DAY WITH SUPPER   zinc gluconate 50 MG tablet Take 50 mg by mouth daily.        Allergies: No Known Allergies  Past Medical History, Surgical history, Social history, and Family History were reviewed and updated.  Review of Systems: All other 10 point review of systems is negative.   Physical Exam:  height is  (1.575 m) and weight is 168 lb (76.2 kg). Her oral temperature is 98.9 F (37.2 C). Her blood pressure is 129/73 and her pulse is 94. Her respiration is 18 and oxygen saturation is 96%.   Wt Readings from Last 3 Encounters:  10/16/22 168 lb (76.2 kg)  11/08/21 171 lb 0.6 oz (77.6 kg)  07/14/21 175 lb (79.4 kg)    Ocular: Sclerae unicteric, pupils equal, round and reactive to light Ear-nose-throat: Oropharynx clear, dentition fair Lymphatic: No cervical or supraclavicular adenopathy Lungs no rales or rhonchi, good excursion bilaterally Heart regular rate and rhythm, no murmur appreciated Abd soft, nontender, positive bowel sounds MSK no focal spinal tenderness, no joint edema Neuro: non-focal, well-oriented, appropriate affect Breasts: Deferred   Lab Results  Component Value Date   WBC 13.6 (H) 10/16/2022   HGB 13.1 10/16/2022   HCT 41.8 10/16/2022   MCV 94.8 10/16/2022   PLT 403 (H) 10/16/2022   Lab Results  Component Value Date   FERRITIN 89 11/08/2021   IRON 123 11/08/2021   TIBC 480 (H) 11/08/2021   UIBC 357 11/08/2021   IRONPCTSAT 26 11/08/2021   Lab Results  Component Value Date   RETICCTPCT 3.0  10/16/2022   RBC 4.46 10/16/2022   RBC 4.41 10/16/2022   RETICCTABS 88.8 12/21/2014   No results found for: "KPAFRELGTCHN", "LAMBDASER", "KAPLAMBRATIO" No results found for: "IGGSERUM", "IGA", "IGMSERUM" No results found for: "TOTALPROTELP", "ALBUMINELP", "A1GS", "A2GS", "BETS", "BETA2SER", "GAMS", "MSPIKE", "SPEI"   Chemistry      Component Value Date/Time   NA 141 08/23/2021 0819   NA 141 03/12/2017 0929   K 4.3 08/23/2021 0819   K 2.8 (LL) 03/12/2017 0929   CL 101 08/23/2021 0819   CL 106 12/08/2011 1145   CO2 33 (H) 08/23/2021 0819   CO2 27 03/12/2017 0929   BUN 16 08/23/2021 0819   BUN 9.4 03/12/2017 0929  CREATININE 1.55 (H) 08/23/2021 0819   CREATININE 1.2 (H) 03/12/2017 0929      Component Value Date/Time   CALCIUM 10.2 08/23/2021 0819   CALCIUM 9.5 03/12/2017 0929   ALKPHOS 111 08/23/2021 0819   ALKPHOS 125 03/12/2017 0929   AST 11 (L) 08/23/2021 0819   AST 13 03/12/2017 0929   ALT 12 08/23/2021 0819   ALT 9 03/12/2017 0929   BILITOT 0.3 08/23/2021 0819   BILITOT <0.22 03/12/2017 0929       Impression and Plan: Ms. Mcclard is a very pleasant 52 yo caucasian female with iron deficiency anemia due to GI blood loss with Crohn's as well as history of thrombotic disease on anticoagulation. Iron studies are pending.  Follow-up in 6 months.   Eileen Stanford, NP 4/22/20249:38 AM

## 2022-10-16 NOTE — Telephone Encounter (Signed)
Per 10/16/22 los - called patient and was not able to lvm - mailbox full - mailed calendar

## 2022-10-18 ENCOUNTER — Inpatient Hospital Stay: Payer: Medicaid Other

## 2022-10-18 NOTE — Progress Notes (Signed)
[facility] Clinical Social Work  Initial Assessment   Kari Hahn is a 52 y.o. year old female contacted by phone. Clinical Social Work was referred by Kari Stanford, NP for assessment of psychosocial needs.   SDOH (Social Determinants of Health) assessments performed: Yes SDOH Interventions    Flowsheet Row Clinical Support from 10/18/2022 in [facility]  SDOH Interventions   Food Insecurity Interventions Intervention Not Indicated  Housing Interventions Intervention Not Indicated  Transportation Interventions Intervention Not Indicated  Utilities Interventions Intervention Not Indicated  Financial Strain Interventions Intervention Not Indicated  Social Connections Interventions Intervention Not Indicated       SDOH Screenings   Food Insecurity: No Food Insecurity (10/18/2022)  Housing: Low Risk  (10/18/2022)  Transportation Needs: No Transportation Needs (10/18/2022)  Utilities: Not At Risk (10/18/2022)  Financial Resource Strain: Low Risk  (10/18/2022)  Social Connections: Socially Isolated (10/18/2022)  Tobacco Use: High Risk (10/16/2022)     Distress Screen completed: No     No data to display            Family/Social Information:  Housing Arrangement: patient lives with her son. Family members/support persons in your life? Family and Friends Transportation concerns: no  Employment: Legally disabled  Income source: Secretary/administrator concerns: Yes, current concerns Type of concern:  Patient expressed having a general concern for finances since she is on a fixed income. Food access concerns: no Religious or spiritual practice: Yes-Patient stated she does not belong to a church, but is very religious and prays often. Services Currently in place:  Medicaid and SSDI.  Coping/ Adjustment to diagnosis: Patient understands treatment plan and what happens next? yes Concerns about diagnosis and/or treatment: Quality of  life Patient reported stressors: Finances and going through a divorce.  She had been married for 28 years when her husband said he wanted a divorce last year. Hopes and/or priorities: "To get my divorce." Patient enjoys time with family/ friends Current coping skills/ strengths: Manufacturing systems engineer , Radio producer fund of knowledge , Motivation for treatment/growth , and Supportive family/friends     SUMMARY: Current SDOH Barriers:  Family and relationship dysfunction  Clinical Social Work Clinical Goal(s):  Explore community resource options for unmet needs related to:  Stress  Interventions: Discussed common feeling and emotions when being diagnosed with cancer, and the importance of support during treatment Informed patient of the support team roles and support services at [facility] Provided CSW contact information and encouraged patient to call with any questions or concerns Provided patient with information about available counseling, but she declined.  She stated her faith was very strong and that she was depending on God.   Follow Up Plan: Patient will contact CSW with any support or resource needs Patient verbalizes understanding of plan: Yes    Kari Hahn Kari Herbers, LCSW   Patient is participating in a Managed Medicaid Plan:  Yes

## 2022-10-19 ENCOUNTER — Inpatient Hospital Stay: Payer: Medicaid Other

## 2022-10-19 VITALS — BP 119/67 | HR 79 | Temp 97.0°F | Resp 16

## 2022-10-19 DIAGNOSIS — D500 Iron deficiency anemia secondary to blood loss (chronic): Secondary | ICD-10-CM

## 2022-10-19 DIAGNOSIS — D509 Iron deficiency anemia, unspecified: Secondary | ICD-10-CM

## 2022-10-19 DIAGNOSIS — Z86718 Personal history of other venous thrombosis and embolism: Secondary | ICD-10-CM | POA: Diagnosis not present

## 2022-10-19 MED ORDER — SODIUM CHLORIDE 0.9 % IV SOLN
200.0000 mg | Freq: Once | INTRAVENOUS | Status: AC
  Administered 2022-10-19: 200 mg via INTRAVENOUS
  Filled 2022-10-19: qty 200

## 2022-10-19 MED ORDER — SODIUM CHLORIDE 0.9 % IV SOLN: Freq: Once | INTRAVENOUS | Status: AC

## 2022-10-19 NOTE — Patient Instructions (Signed)

## 2022-10-20 ENCOUNTER — Other Ambulatory Visit: Payer: Self-pay | Admitting: Family

## 2022-10-20 DIAGNOSIS — E538 Deficiency of other specified B group vitamins: Secondary | ICD-10-CM

## 2022-10-26 ENCOUNTER — Inpatient Hospital Stay: Payer: Medicaid Other | Attending: Hematology & Oncology

## 2022-10-26 VITALS — BP 136/92 | HR 82 | Resp 16

## 2022-10-26 DIAGNOSIS — D510 Vitamin B12 deficiency anemia due to intrinsic factor deficiency: Secondary | ICD-10-CM | POA: Diagnosis not present

## 2022-10-26 DIAGNOSIS — K922 Gastrointestinal hemorrhage, unspecified: Secondary | ICD-10-CM | POA: Diagnosis not present

## 2022-10-26 DIAGNOSIS — D509 Iron deficiency anemia, unspecified: Secondary | ICD-10-CM

## 2022-10-26 DIAGNOSIS — D500 Iron deficiency anemia secondary to blood loss (chronic): Secondary | ICD-10-CM | POA: Insufficient documentation

## 2022-10-26 MED ORDER — SODIUM CHLORIDE 0.9 % IV SOLN
200.0000 mg | Freq: Once | INTRAVENOUS | Status: AC
  Administered 2022-10-26: 200 mg via INTRAVENOUS
  Filled 2022-10-26: qty 200

## 2022-10-26 MED ORDER — SODIUM CHLORIDE 0.9 % IV SOLN: Freq: Once | INTRAVENOUS | Status: AC

## 2022-10-26 NOTE — Progress Notes (Signed)
Patient does not want to stay for the 30 minute recommended post IV iron observation. Patient VSS. Patient discharged ambulatory without issues or concerns.

## 2022-10-26 NOTE — Patient Instructions (Signed)

## 2022-11-02 ENCOUNTER — Inpatient Hospital Stay: Payer: Medicaid Other

## 2022-11-02 VITALS — BP 114/68 | HR 88 | Temp 97.7°F | Resp 18

## 2022-11-02 DIAGNOSIS — D500 Iron deficiency anemia secondary to blood loss (chronic): Secondary | ICD-10-CM

## 2022-11-02 DIAGNOSIS — D509 Iron deficiency anemia, unspecified: Secondary | ICD-10-CM

## 2022-11-02 DIAGNOSIS — E538 Deficiency of other specified B group vitamins: Secondary | ICD-10-CM

## 2022-11-02 DIAGNOSIS — I82431 Acute embolism and thrombosis of right popliteal vein: Secondary | ICD-10-CM

## 2022-11-02 LAB — CBC WITH DIFFERENTIAL (CANCER CENTER ONLY)
Abs Immature Granulocytes: 0.28 10*3/uL — ABNORMAL HIGH (ref 0.00–0.07)
Basophils Absolute: 0.1 10*3/uL (ref 0.0–0.1)
Basophils Relative: 1 %
Eosinophils Absolute: 0 10*3/uL (ref 0.0–0.5)
Eosinophils Relative: 0 %
HCT: 43.7 % (ref 36.0–46.0)
Hemoglobin: 13.6 g/dL (ref 12.0–15.0)
Immature Granulocytes: 3 %
Lymphocytes Relative: 10 %
Lymphs Abs: 1 10*3/uL (ref 0.7–4.0)
MCH: 31 pg (ref 26.0–34.0)
MCHC: 31.1 g/dL (ref 30.0–36.0)
MCV: 99.5 fL (ref 80.0–100.0)
Monocytes Absolute: 0.5 10*3/uL (ref 0.1–1.0)
Monocytes Relative: 5 %
Neutro Abs: 8.3 10*3/uL — ABNORMAL HIGH (ref 1.7–7.7)
Neutrophils Relative %: 81 %
Platelet Count: 402 10*3/uL — ABNORMAL HIGH (ref 150–400)
RBC: 4.39 MIL/uL (ref 3.87–5.11)
RDW: 21.5 % — ABNORMAL HIGH (ref 11.5–15.5)
WBC Count: 10.1 10*3/uL (ref 4.0–10.5)
nRBC: 0.2 % (ref 0.0–0.2)

## 2022-11-02 LAB — RETICULOCYTES
Immature Retic Fract: 30.7 % — ABNORMAL HIGH (ref 2.3–15.9)
RBC.: 4.3 MIL/uL (ref 3.87–5.11)
Retic Count, Absolute: 144.5 10*3/uL (ref 19.0–186.0)
Retic Ct Pct: 3.4 % — ABNORMAL HIGH (ref 0.4–3.1)

## 2022-11-02 LAB — IRON AND IRON BINDING CAPACITY (CC-WL,HP ONLY)
Iron: 41 ug/dL (ref 28–170)
Saturation Ratios: 9 % — ABNORMAL LOW (ref 10.4–31.8)
TIBC: 465 ug/dL — ABNORMAL HIGH (ref 250–450)
UIBC: 424 ug/dL (ref 148–442)

## 2022-11-02 LAB — VITAMIN B12: Vitamin B-12: 457 pg/mL (ref 180–914)

## 2022-11-02 LAB — FERRITIN: Ferritin: 142 ng/mL (ref 11–307)

## 2022-11-02 MED ORDER — SODIUM CHLORIDE 0.9 % IV SOLN
200.0000 mg | Freq: Once | INTRAVENOUS | Status: AC
  Administered 2022-11-02: 200 mg via INTRAVENOUS
  Filled 2022-11-02: qty 200

## 2022-11-02 MED ORDER — SODIUM CHLORIDE 0.9 % IV SOLN: Freq: Once | INTRAVENOUS | Status: AC

## 2022-11-02 NOTE — Progress Notes (Signed)
Patient refused to wait 30 minutes post infusion. Released stable and ASX. 

## 2022-11-02 NOTE — Progress Notes (Signed)
Patient does not want to stay for the 30 minute post IV iron observation. VSS. Patient discharged ambulatory without complains or concerns.

## 2022-11-02 NOTE — Patient Instructions (Signed)

## 2022-11-09 ENCOUNTER — Inpatient Hospital Stay: Payer: Medicaid Other

## 2022-11-09 VITALS — BP 146/75 | HR 83 | Temp 98.0°F | Resp 20

## 2022-11-09 DIAGNOSIS — D500 Iron deficiency anemia secondary to blood loss (chronic): Secondary | ICD-10-CM | POA: Diagnosis not present

## 2022-11-09 DIAGNOSIS — D509 Iron deficiency anemia, unspecified: Secondary | ICD-10-CM

## 2022-11-09 MED ORDER — SODIUM CHLORIDE 0.9 % IV SOLN
200.0000 mg | Freq: Once | INTRAVENOUS | Status: AC
  Administered 2022-11-09: 200 mg via INTRAVENOUS
  Filled 2022-11-09: qty 200

## 2022-11-09 MED ORDER — SODIUM CHLORIDE 0.9 % IV SOLN: Freq: Once | INTRAVENOUS | Status: AC

## 2022-11-09 NOTE — Patient Instructions (Signed)

## 2022-11-10 ENCOUNTER — Encounter: Payer: Self-pay | Admitting: *Deleted

## 2022-11-10 NOTE — Progress Notes (Signed)
Patient has been requesting Vitamin B12 injection.  Levels drawn at last visit.  Spoke with Eileen Stanford NP who looked at patients B12 levels and determined she does not need B12 injections at this point.  Suggested she take Vitamin B12 sublingual.

## 2022-11-16 ENCOUNTER — Inpatient Hospital Stay: Payer: Medicaid Other

## 2022-11-17 ENCOUNTER — Inpatient Hospital Stay: Payer: Medicaid Other

## 2022-11-17 VITALS — BP 128/61 | HR 81 | Temp 97.5°F | Resp 18

## 2022-11-17 DIAGNOSIS — D500 Iron deficiency anemia secondary to blood loss (chronic): Secondary | ICD-10-CM | POA: Diagnosis not present

## 2022-11-17 DIAGNOSIS — D509 Iron deficiency anemia, unspecified: Secondary | ICD-10-CM

## 2022-11-17 MED ORDER — SODIUM CHLORIDE 0.9 % IV SOLN
200.0000 mg | Freq: Once | INTRAVENOUS | Status: AC
  Administered 2022-11-17: 200 mg via INTRAVENOUS
  Filled 2022-11-17: qty 200

## 2022-11-17 MED ORDER — SODIUM CHLORIDE 0.9 % IV SOLN: Freq: Once | INTRAVENOUS | Status: AC

## 2022-11-17 NOTE — Progress Notes (Signed)
Patient refused to wait 30 minutes post infusion. Released stable and ASX. 

## 2022-11-17 NOTE — Patient Instructions (Signed)

## 2022-12-19 ENCOUNTER — Other Ambulatory Visit: Payer: Self-pay | Admitting: Hematology & Oncology

## 2022-12-19 DIAGNOSIS — I749 Embolism and thrombosis of unspecified artery: Secondary | ICD-10-CM

## 2023-04-17 ENCOUNTER — Other Ambulatory Visit: Payer: Medicaid Other

## 2023-04-17 ENCOUNTER — Ambulatory Visit: Payer: Medicaid Other | Admitting: Family

## 2023-04-18 ENCOUNTER — Other Ambulatory Visit: Payer: Self-pay

## 2023-04-18 DIAGNOSIS — D509 Iron deficiency anemia, unspecified: Secondary | ICD-10-CM

## 2023-04-19 ENCOUNTER — Inpatient Hospital Stay: Payer: Medicaid Other | Attending: Hematology & Oncology

## 2023-04-19 ENCOUNTER — Inpatient Hospital Stay: Payer: Medicaid Other | Admitting: Medical Oncology

## 2023-05-27 DEATH — deceased
